# Patient Record
Sex: Male | Born: 1955 | Race: White | Hispanic: No | Marital: Married | Smoking: Former smoker
Health system: Southern US, Community
[De-identification: ages and names within clinical notes are randomized; demographics above are authoritative.]

## PROBLEM LIST (undated history)

## (undated) DIAGNOSIS — E785 Hyperlipidemia, unspecified: Secondary | ICD-10-CM

## (undated) DIAGNOSIS — I10 Essential (primary) hypertension: Secondary | ICD-10-CM

## (undated) DIAGNOSIS — M1990 Unspecified osteoarthritis, unspecified site: Secondary | ICD-10-CM

## (undated) DIAGNOSIS — K219 Gastro-esophageal reflux disease without esophagitis: Secondary | ICD-10-CM

## (undated) HISTORY — PX: TONSILLECTOMY: SUR1361

## (undated) HISTORY — PX: APPENDECTOMY: SHX54

## (undated) HISTORY — PX: OTHER SURGICAL HISTORY: SHX169

---

## 2001-11-26 ENCOUNTER — Emergency Department (HOSPITAL_COMMUNITY): Admission: EM | Admit: 2001-11-26 | Discharge: 2001-11-26 | Payer: Self-pay | Admitting: Emergency Medicine

## 2001-11-27 ENCOUNTER — Ambulatory Visit (HOSPITAL_BASED_OUTPATIENT_CLINIC_OR_DEPARTMENT_OTHER): Admission: RE | Admit: 2001-11-27 | Discharge: 2001-11-27 | Payer: Self-pay | Admitting: Orthopedic Surgery

## 2003-07-09 ENCOUNTER — Emergency Department (HOSPITAL_COMMUNITY): Admission: EM | Admit: 2003-07-09 | Discharge: 2003-07-09 | Payer: Self-pay

## 2004-06-30 ENCOUNTER — Emergency Department (HOSPITAL_COMMUNITY): Admission: EM | Admit: 2004-06-30 | Discharge: 2004-06-30 | Payer: Self-pay | Admitting: Emergency Medicine

## 2007-06-14 ENCOUNTER — Emergency Department (HOSPITAL_COMMUNITY): Admission: EM | Admit: 2007-06-14 | Discharge: 2007-06-14 | Payer: Self-pay | Admitting: Emergency Medicine

## 2010-05-28 ENCOUNTER — Emergency Department (HOSPITAL_COMMUNITY)
Admission: EM | Admit: 2010-05-28 | Discharge: 2010-05-28 | Payer: Self-pay | Source: Home / Self Care | Admitting: Emergency Medicine

## 2010-06-01 ENCOUNTER — Inpatient Hospital Stay (HOSPITAL_COMMUNITY)
Admission: EM | Admit: 2010-06-01 | Discharge: 2010-06-03 | Payer: Self-pay | Source: Home / Self Care | Attending: Internal Medicine | Admitting: Internal Medicine

## 2010-06-03 ENCOUNTER — Ambulatory Visit (HOSPITAL_COMMUNITY)
Admission: RE | Admit: 2010-06-03 | Discharge: 2010-06-03 | Payer: Self-pay | Source: Home / Self Care | Admitting: Cardiology

## 2010-09-07 LAB — DIFFERENTIAL
Basophils Relative: 0 % (ref 0–1)
Eosinophils Absolute: 0.1 10*3/uL (ref 0.0–0.7)
Lymphocytes Relative: 24 % (ref 12–46)
Lymphs Abs: 1.8 10*3/uL (ref 0.7–4.0)
Monocytes Absolute: 0.4 10*3/uL (ref 0.1–1.0)
Neutrophils Relative %: 69 % (ref 43–77)

## 2010-09-07 LAB — CBC
HCT: 44.7 % (ref 39.0–52.0)
Hemoglobin: 15.4 g/dL (ref 13.0–17.0)
MCH: 30.6 pg (ref 26.0–34.0)
MCH: 31.6 pg (ref 26.0–34.0)
MCV: 87.8 fL (ref 78.0–100.0)
MCV: 88.1 fL (ref 78.0–100.0)
Platelets: 199 10*3/uL (ref 150–400)
RBC: 5.03 MIL/uL (ref 4.22–5.81)
RDW: 12.6 % (ref 11.5–15.5)
WBC: 7.5 10*3/uL (ref 4.0–10.5)
WBC: 8.2 10*3/uL (ref 4.0–10.5)

## 2010-09-07 LAB — BASIC METABOLIC PANEL
BUN: 10 mg/dL (ref 6–23)
CO2: 28 mEq/L (ref 19–32)
Calcium: 9.2 mg/dL (ref 8.4–10.5)
Creatinine, Ser: 0.94 mg/dL (ref 0.4–1.5)
GFR calc Af Amer: >60 mL/min (ref >60)
Glucose, Bld: 107 mg/dL — ABNORMAL HIGH (ref 70–99)
Potassium: 4.4 mEq/L (ref 3.5–5.1)
Sodium: 138 mEq/L (ref 135–145)

## 2010-09-07 LAB — LIPID PANEL
Cholesterol: 149 mg/dL (ref 0–200)
Total CHOL/HDL Ratio: 3.4 RATIO
VLDL: 49 mg/dL — ABNORMAL HIGH (ref 0–40)

## 2010-09-07 LAB — CK TOTAL AND CKMB (NOT AT ARMC)
Relative Index: INVALID (ref 0.0–2.5)
Total CK: 104 U/L (ref 7–232)

## 2010-09-07 LAB — COMPREHENSIVE METABOLIC PANEL
Alkaline Phosphatase: 47 U/L (ref 39–117)
CO2: 23 mEq/L (ref 19–32)
GFR calc Af Amer: >60 mL/min (ref >60)
GFR calc non Af Amer: >60 mL/min (ref >60)
Glucose, Bld: 103 mg/dL — ABNORMAL HIGH (ref 70–99)
Potassium: 4.4 mEq/L (ref 3.5–5.1)
Sodium: 139 mEq/L (ref 135–145)
Total Protein: 7 g/dL (ref 6.0–8.3)

## 2010-09-07 LAB — PROTIME-INR
INR: 0.96 (ref 0.00–1.49)
Prothrombin Time: 13 seconds (ref 11.6–15.2)

## 2010-09-07 LAB — TROPONIN I: Troponin I: 0.02 ng/mL (ref 0.00–0.06)

## 2010-09-07 LAB — CARDIAC PANEL(CRET KIN+CKTOT+MB+TROPI)
Relative Index: INVALID (ref 0.0–2.5)
Total CK: 54 U/L (ref 7–232)

## 2010-11-13 NOTE — Op Note (Signed)
[facility]  Patient:    Lawrence, Henderson Visit Number: [id number] MRN: [id number]          Service Type: DSU Location: [facility] Attending Physician:  Twana First Dictated by:   Elana Alm Thurston Hole, M.D. Proc. Date: 11/27/01 Admit Date:  11/27/2001                             Operative Report  PREOPERATIVE DIAGNOSIS:  Right grade 1 open humeral shaft fracture.  POSTOPERATIVE DIAGNOSIS:  Right grade 1 open humeral shaft fracture.  PROCEDURES 1. Open reduction, internal fixation of right humeral shaft fracture    using Ace-Dupuy 7.0 mm x 26.0 cm humeral rod with proximal interlocking. 2. Irrigation and debridement of right grade 1 open humeral shaft fracture.  SURGEON:  Elana Alm. Thurston Hole, M.D.  ASSISTANT:  Julien Girt, P.A.  ANESTHESIA:  General.  OPERATIVE TIME:  1-1/2 hours.  COMPLICATIONS:  None.  INDICATIONS:  Lawrence Henderson is a 55 year old gentleman who sustained a humeral shaft fracture on November 26, 2001, when he was kicked by a horse in the midshaft of the humerus.  He sustained a small 1.0 cm open laceration at that time.  He underwent an irrigation and then debridement in the emergency room yesterday, and IV Vancomycin, and returns now to the operating room for a formal fixation of this and further irrigation and debridement.  DESCRIPTION OF PROCEDURE:  Lawrence Henderson was brought to the operating room on November 27, 2001, after a supraclavicular block had been placed in the holding room.  He was placed on the operating room table in the supine position. After an adequate level of general anesthesia was obtained, his right shoulder and arm was prepped using sterile Betadine, and draped using a sterile technique.  Initially through his previously traumatic incision, this was extended 1.0 cm proximally and distally, and a thorough irrigation with both 1000 cc of saline and 500 cc of antibiotic solution was thoroughly  irrigated through the wound, down to the fracture site which could be easily palpated through this traumatic area.  After this was done, then through a 2.0 cm rotator cuff lateral acromial incision, initial exposure to the proximal humerus was made.  The underlying subcutaneous tissues were incised in line with the skin incision.  A guide pin was placed at the tip of the greater tuberosity under fluoroscopic control, and then drilled down the proximal humerus under fluoroscopic control, and then overdrilled with the step drill from the Ace-Dupuy set.  After this was done, then a guide wire was placed down the humeral shaft to cross the fracture site, and into the distal humeral shaft.  It was then overdrilled with an 8.0 mm drill.  It was measured for length, and a 16.0 mm was found to be the appropriate length.  Then a 7.0 mm x 26.0 mm humeral rod was placed with excellent fixation.  A proximal interlocking screw was placed using the proximal guide and a 38.0 mm interlocking screw was placed to secure the proximal aspect of the humeral rod.  It was felt that it could possibly be further stabilized by putting in a distal humeral rod; however, I attempted to do this with four different attempts to gain good access to the distal interlocking screw; however, this was not successful, and I did feel after this that there was just minimal rotational instability of the fracture and there was still excellent stability  without the distal interlocking screw.  Thus I aborted this effort.  This had been done through a separate 2.0 cm transverse incision.  At this point then all wounds were further irrigated.  The fracture was found to be stable and the traumatic wound was found to be satisfactorily irrigated and debrided. The rotator cuff incision was then closed with #0 Panacryl sutures.  The subcutaneous tissues and all the other incisions were closed with #0 and #2-0 Vicryl.  The skin was closed  with skin staples.  Sterile dressings were applied, then a brace and a sling.  The patient was awakened and taken to the recovery room in stable condition. The needle and sponge counts were correct x 2 at the end of the case.  FOLLOW-UP CARE:  Lawrence Henderson will be followed as an outpatient on Percocet for pain and Cipro 500 mg p.o. b.i.d.  I will see him back in the office in one week for a wound check and followup. Dictated by:   Elana Alm Thurston Hole, M.D. Attending Physician:  Twana First DD:  11/27/01 TD:  11/28/01 Job: [id number]

## 2011-04-02 LAB — COMPREHENSIVE METABOLIC PANEL
AST: 22
BUN: 8
CO2: 26
Creatinine, Ser: 0.79
GFR calc Af Amer: >60
Potassium: 3.8
Total Protein: 6.6

## 2011-04-02 LAB — CBC
MCHC: 35.5
MCV: 87.5
RBC: 4.79

## 2011-04-02 LAB — DIFFERENTIAL
Basophils Absolute: 0
Basophils Relative: 1
Eosinophils Relative: 2
Lymphocytes Relative: 28
Monocytes Absolute: 0.4
Monocytes Relative: 5
Neutro Abs: 5

## 2011-04-02 LAB — URINALYSIS, ROUTINE W REFLEX MICROSCOPIC
Nitrite: NEGATIVE
Urobilinogen, UA: 0.2
pH: 6.5

## 2011-04-02 LAB — LIPASE, BLOOD: Lipase: 26

## 2015-03-03 ENCOUNTER — Emergency Department (HOSPITAL_COMMUNITY): Admission: EM | Admit: 2015-03-03 | Discharge: 2015-03-03 | Discharge status: Absent without leave | Payer: Self-pay

## 2015-03-03 ENCOUNTER — Encounter (HOSPITAL_COMMUNITY): Payer: Self-pay | Admitting: *Deleted

## 2015-03-03 ENCOUNTER — Emergency Department (EMERGENCY_DEPARTMENT_HOSPITAL): Payer: 59

## 2015-03-03 ENCOUNTER — Emergency Department (HOSPITAL_COMMUNITY)
Admission: EM | Admit: 2015-03-03 | Discharge: 2015-03-03 | Disposition: A | Discharge status: Home / Self Care | Payer: 59 | Attending: Emergency Medicine | Admitting: Emergency Medicine

## 2015-03-03 DIAGNOSIS — Y9389 Activity, other specified: Secondary | ICD-10-CM | POA: Insufficient documentation

## 2015-03-03 DIAGNOSIS — W1839XA Other fall on same level, initial encounter: Secondary | ICD-10-CM | POA: Insufficient documentation

## 2015-03-03 DIAGNOSIS — Y998 Other external cause status: Secondary | ICD-10-CM | POA: Insufficient documentation

## 2015-03-03 DIAGNOSIS — Z7982 Long term (current) use of aspirin: Secondary | ICD-10-CM | POA: Insufficient documentation

## 2015-03-03 DIAGNOSIS — I10 Essential (primary) hypertension: Secondary | ICD-10-CM | POA: Insufficient documentation

## 2015-03-03 DIAGNOSIS — E785 Hyperlipidemia, unspecified: Secondary | ICD-10-CM | POA: Diagnosis not present

## 2015-03-03 DIAGNOSIS — Y9289 Other specified places as the place of occurrence of the external cause: Secondary | ICD-10-CM | POA: Insufficient documentation

## 2015-03-03 DIAGNOSIS — S80812A Abrasion, left lower leg, initial encounter: Secondary | ICD-10-CM | POA: Insufficient documentation

## 2015-03-03 DIAGNOSIS — M7989 Other specified soft tissue disorders: Secondary | ICD-10-CM | POA: Diagnosis not present

## 2015-03-03 DIAGNOSIS — L0103 Bullous impetigo: Secondary | ICD-10-CM | POA: Insufficient documentation

## 2015-03-03 DIAGNOSIS — Z79899 Other long term (current) drug therapy: Secondary | ICD-10-CM | POA: Diagnosis not present

## 2015-03-03 DIAGNOSIS — L03116 Cellulitis of left lower limb: Secondary | ICD-10-CM | POA: Diagnosis not present

## 2015-03-03 DIAGNOSIS — Z880 Allergy status to penicillin: Secondary | ICD-10-CM | POA: Diagnosis not present

## 2015-03-03 HISTORY — DX: Essential (primary) hypertension: I10

## 2015-03-03 HISTORY — DX: Hyperlipidemia, unspecified: E78.5

## 2015-03-03 MED ORDER — MUPIROCIN CALCIUM 2 % EX CREA: 1.0000 "application " | TOPICAL_CREAM | Freq: Two times a day (BID) | CUTANEOUS | Status: DC

## 2015-03-03 MED ORDER — DOXYCYCLINE HYCLATE 100 MG PO CAPS: 100.0000 mg | ORAL_CAPSULE | Freq: Two times a day (BID) | ORAL | Status: DC

## 2015-03-03 NOTE — Progress Notes (Signed)
VASCULAR LAB PRELIMINARY  PRELIMINARY  PRELIMINARY  PRELIMINARY  Left lower extremity venous duplex completed.    Preliminary report:  Left:  No evidence of DVT, superficial thrombosis, or Baker's cyst.  Jaquarious Grey, RVT 03/03/2015, 6:29 PM

## 2015-03-03 NOTE — ED Notes (Signed)
Bed: [facility] Expected date:  Expected time:  Means of arrival:  Comments: Triage 1

## 2015-03-03 NOTE — ED Provider Notes (Signed)
CSN: [id number]     Arrival date & time 03/03/15  1615 History   First MD Initiated Contact with Patient 03/03/15 1641     Chief Complaint  Patient presents with  . Leg Swelling  . Leg Blisters      (Consider location/radiation/quality/duration/timing/severity/associated sxs/prior Treatment) Patient is a 59 y.o. male presenting with rash. The history is provided by the patient.  Rash Location:  Leg Leg rash location:  L lower leg Quality: blistering, redness and swelling   Severity:  Moderate Onset quality:  Gradual Duration:  2 days Timing:  Constant Progression:  Worsening Chronicity:  New Context comment:  Recent abrasion to shin Relieved by:  Nothing Worsened by:  Nothing tried Ineffective treatments:  None tried Associated symptoms: no fever and no joint pain     Past Medical History  Diagnosis Date  . Hypertension   . Hyperlipidemia    Past Surgical History  Procedure Laterality Date  . Tonsillectomy    . Appendectomy    . Arm surgery      right arm 2006   History reviewed. No pertinent family history. Social History  Substance Use Topics  . Smoking status: Never Smoker   . Smokeless tobacco: None  . Alcohol Use: Yes    Review of Systems  Constitutional: Negative for fever.  Musculoskeletal: Negative for arthralgias.  Skin: Positive for rash.  All other systems reviewed and are negative.     Allergies  Penicillins and Sulfa antibiotics  Home Medications   Prior to Admission medications   Medication Sig Start Date End Date Taking? Authorizing Provider  aspirin EC 81 MG tablet Take 81 mg by mouth daily.   Yes Historical Provider, MD  lisinopril (PRINIVIL,ZESTRIL) 20 MG tablet Take 20 mg by mouth daily. 01/13/15  Yes Historical Provider, MD  Multiple Vitamin (MULTIVITAMIN WITH MINERALS) TABS tablet Take 1 tablet by mouth daily.   Yes Historical Provider, MD  naproxen sodium (ANAPROX) 220 MG tablet Take 440 mg by mouth daily as needed (pain).   Yes  Historical Provider, MD  OVER THE COUNTER MEDICATION Take 1 capsule by mouth daily. otc irritable bowel med   Yes Historical Provider, MD  simvastatin (ZOCOR) 40 MG tablet Take 40 mg by mouth daily. 01/06/15  Yes Historical Provider, MD  vitamin C (ASCORBIC ACID) 500 MG tablet Take 500 mg by mouth daily.   Yes Historical Provider, MD  doxycycline (VIBRAMYCIN) 100 MG capsule Take 1 capsule (100 mg total) by mouth 2 (two) times daily. 03/03/15   Leo Grosser, MD  mupirocin cream (BACTROBAN) 2 % Apply 1 application topically 2 (two) times daily. 03/03/15   Leo Grosser, MD   BP 140/92 mmHg  Pulse 87  Temp(Src) 98.7 F (37.1 C) (Oral)  Resp 16  SpO2 97% Physical Exam  Constitutional: He is oriented to person, place, and time. He appears well-developed and well-nourished. No distress.  HENT:  Head: Normocephalic and atraumatic.  Eyes: Conjunctivae are normal.  Neck: Neck supple. No tracheal deviation present.  Cardiovascular: Normal rate and regular rhythm.   Pulmonary/Chest: Effort normal. No respiratory distress.  Abdominal: Soft. He exhibits no distension.  Neurological: He is alert and oriented to person, place, and time.  Skin: Skin is warm and dry.  Abrasion of left shin with yellow crusting drainage, multiple fluid filled bullae overlying calf and extending erythema  Psychiatric: He has a normal mood and affect.    ED Course  Procedures (including critical care time) Labs Review Labs Reviewed -  No data to display  Imaging Review No results found. I have personally reviewed and evaluated these images and lab results as part of my medical decision-making.   EKG Interpretation None      MDM   Final diagnoses:  Left leg swelling  Bullous impetigo  Cellulitis of left lower extremity    59 y.o. male presents after sustaining n abrasion to his left shin and after riding horses noticing increased swelling, redness, and bullae over his calf. Negative for DVT. Has yellow crusting  at initial injury site, presentation c/w bullous impetigo likely from staph infection. Planned outpatient management with bactroban and doxycycline as Pt has no signs of sepsis or systemic illness at this time. Plan to follow up with PCP as needed and return precautions discussed for worsening or new concerning symptoms.     Leo Grosser, MD 03/04/15 [phone]

## 2015-03-03 NOTE — ED Notes (Addendum)
Pt sent from Urgent care, pt fell and scraped left shin leg on Friday 9/2, abrasion to left shin. On Saturday 9/3 pt rode horses x7 hours. Sunday morning blisters noted to lower medial left leg and back of lower left leg. Pain 8/10 with ambulation. Denies SOB. Sent from urgent care to rule out blood clot. No complains with right leg, pt reports redness of right leg is normal.

## 2015-03-03 NOTE — Discharge Instructions (Signed)

## 2015-07-14 ENCOUNTER — Encounter (HOSPITAL_COMMUNITY): Payer: Self-pay | Admitting: Family Medicine

## 2015-07-14 ENCOUNTER — Emergency Department (HOSPITAL_COMMUNITY): Payer: 59

## 2015-07-14 ENCOUNTER — Emergency Department (HOSPITAL_COMMUNITY)
Admission: EM | Admit: 2015-07-14 | Discharge: 2015-07-14 | Disposition: A | Discharge status: Home / Self Care | Payer: 59 | Attending: Emergency Medicine | Admitting: Emergency Medicine

## 2015-07-14 DIAGNOSIS — R1013 Epigastric pain: Secondary | ICD-10-CM | POA: Insufficient documentation

## 2015-07-14 DIAGNOSIS — E785 Hyperlipidemia, unspecified: Secondary | ICD-10-CM | POA: Insufficient documentation

## 2015-07-14 DIAGNOSIS — Z9049 Acquired absence of other specified parts of digestive tract: Secondary | ICD-10-CM | POA: Insufficient documentation

## 2015-07-14 DIAGNOSIS — I10 Essential (primary) hypertension: Secondary | ICD-10-CM | POA: Diagnosis not present

## 2015-07-14 DIAGNOSIS — Z79899 Other long term (current) drug therapy: Secondary | ICD-10-CM | POA: Diagnosis not present

## 2015-07-14 DIAGNOSIS — Z7982 Long term (current) use of aspirin: Secondary | ICD-10-CM | POA: Diagnosis not present

## 2015-07-14 DIAGNOSIS — R079 Chest pain, unspecified: Secondary | ICD-10-CM | POA: Diagnosis present

## 2015-07-14 LAB — I-STAT TROPONIN, ED
TROPONIN I, POC: 0.01 ng/mL (ref 0.00–0.08)
Troponin i, poc: 0 ng/mL (ref 0.00–0.08)

## 2015-07-14 LAB — BASIC METABOLIC PANEL
Anion gap: 8 (ref 5–15)
BUN: 13 mg/dL (ref 6–20)
CALCIUM: 10.5 mg/dL — AB (ref 8.9–10.3)
CO2: 27 mmol/L (ref 22–32)
CREATININE: 0.91 mg/dL (ref 0.61–1.24)
Chloride: 105 mmol/L (ref 101–111)
Glucose, Bld: 120 mg/dL — ABNORMAL HIGH (ref 65–99)
POTASSIUM: 3.9 mmol/L (ref 3.5–5.1)
SODIUM: 140 mmol/L (ref 135–145)

## 2015-07-14 LAB — CBC
HCT: 44.9 % (ref 39.0–52.0)
HEMOGLOBIN: 15.1 g/dL (ref 13.0–17.0)
MCH: 29.8 pg (ref 26.0–34.0)
MCHC: 33.6 g/dL (ref 30.0–36.0)
MCV: 88.6 fL (ref 78.0–100.0)
PLATELETS: 202 10*3/uL (ref 150–400)
RBC: 5.07 MIL/uL (ref 4.22–5.81)
RDW: 13.2 % (ref 11.5–15.5)
WBC: 13.4 10*3/uL — ABNORMAL HIGH (ref 4.0–10.5)

## 2015-07-14 MED ORDER — PANTOPRAZOLE SODIUM 20 MG PO TBEC: 20.0000 mg | DELAYED_RELEASE_TABLET | Freq: Every day | ORAL | Status: DC

## 2015-07-14 MED ORDER — PANTOPRAZOLE SODIUM 40 MG PO TBEC
40.0000 mg | DELAYED_RELEASE_TABLET | Freq: Once | ORAL | Status: AC
  Administered 2015-07-14: 40 mg via ORAL
  Filled 2015-07-14: qty 1

## 2015-07-14 NOTE — ED Notes (Signed)
Patient undressed, in gown, on monitor, continuous pulse oximetry and blood pressure cuff; visitor at bedside 

## 2015-07-14 NOTE — ED Provider Notes (Signed)
CSN: [id number]     Arrival date & time 07/14/15  0701 History   First MD Initiated Contact with Patient 07/14/15 [phone]     Chief Complaint  Patient presents with  . Chest Pain     (Consider location/radiation/quality/duration/timing/severity/associated sxs/prior Treatment) Patient is a 60 y.o. male presenting with chest pain. The history is provided by the patient and medical records. No language interpreter was used.  Chest Pain Associated symptoms: abdominal pain   Associated symptoms: no back pain, no cough, no dizziness, no headache, no nausea, no shortness of breath, not vomiting and no weakness      ENSAR AUNGST is a 60 y.o. male  with a PMH of HTN, HLD who presents to the Emergency Department complaining of sharp, non-radiating, intermittent epigastric pain that began last night. Patient took tums last night with no relief. No aggravating or alleviating factors noted. Denies chest pain, shortness of breath, fever, n/v.  Father died of MI in his late 54's. No other cardiac family hx. Patient is not a smoker. No hx of heart disease.   Past Medical History  Diagnosis Date  . Hypertension   . Hyperlipidemia    Past Surgical History  Procedure Laterality Date  . Tonsillectomy    . Appendectomy    . Arm surgery      right arm 2006   History reviewed. No pertinent family history. Social History  Substance Use Topics  . Smoking status: Never Smoker   . Smokeless tobacco: None  . Alcohol Use: Yes    Review of Systems  Constitutional: Negative.   HENT: Negative for congestion and sore throat.   Eyes: Negative for visual disturbance.  Respiratory: Negative for cough and shortness of breath.   Cardiovascular: Positive for chest pain.  Gastrointestinal: Positive for abdominal pain. Negative for nausea, vomiting, diarrhea and constipation.  Musculoskeletal: Negative for myalgias, back pain, arthralgias and neck pain.  Skin: Negative for rash.  Allergic/Immunologic:  Negative for immunocompromised state.  Neurological: Negative for dizziness, weakness and headaches.      Allergies  Sulfa antibiotics  Home Medications   Prior to Admission medications   Medication Sig Start Date End Date Taking? Authorizing Provider  aspirin EC 81 MG tablet Take 81 mg by mouth daily.   Yes Historical Provider, MD  calcium carbonate (OS-CAL) 1250 (500 Ca) MG chewable tablet Chew 1-2 tablets by mouth at bedtime as needed for heartburn.   Yes Historical Provider, MD  lisinopril (PRINIVIL,ZESTRIL) 20 MG tablet Take 20 mg by mouth daily. 01/13/15  Yes Historical Provider, MD  Multiple Vitamin (MULTIVITAMIN WITH MINERALS) TABS tablet Take 1 tablet by mouth daily.   Yes Historical Provider, MD  naproxen sodium (ANAPROX) 220 MG tablet Take 440 mg by mouth daily as needed (pain).   Yes Historical Provider, MD  Probiotic Product (DIGESTIVE ADVANTAGE GUMMIES PO) Take 1 tablet by mouth daily.   Yes Historical Provider, MD  ranitidine (ZANTAC) 150 MG tablet Take 150 mg by mouth daily as needed for heartburn.   Yes Historical Provider, MD  simvastatin (ZOCOR) 40 MG tablet Take 40 mg by mouth daily. 01/06/15  Yes Historical Provider, MD  vitamin C (ASCORBIC ACID) 500 MG tablet Take 1,000 mg by mouth daily.    Yes Historical Provider, MD  doxycycline (VIBRAMYCIN) 100 MG capsule Take 1 capsule (100 mg total) by mouth 2 (two) times daily. Patient not taking: Reported on 07/14/2015 03/03/15   Leo Grosser, MD  mupirocin cream (BACTROBAN) 2 % Apply 1  application topically 2 (two) times daily. Patient not taking: Reported on 07/14/2015 03/03/15   Leo Grosser, MD  pantoprazole (PROTONIX) 20 MG tablet Take 1 tablet (20 mg total) by mouth daily. 07/14/15   Tagen Milby Pilcher Jazzma Neidhardt, PA-C   BP 135/87 mmHg  Pulse 83  Temp(Src) 97.6 F (36.4 C) (Oral)  Resp 26  Ht 5\' 11"  (1.803 m)  Wt 110.224 kg  BMI 33.91 kg/m2  SpO2 98% Physical Exam  Constitutional: He is oriented to person, place, and time. He  appears well-developed and well-nourished.  Alert and in no acute distress  HENT:  Head: Normocephalic and atraumatic.  Neck:  No carotid bruits  Cardiovascular: Normal rate, regular rhythm, normal heart sounds and intact distal pulses.  Exam reveals no gallop and no friction rub.   No murmur heard. Pulmonary/Chest: Effort normal and breath sounds normal. No respiratory distress. He has no wheezes. He has no rales. He exhibits no tenderness.  Abdominal: He exhibits no mass. There is no rebound and no guarding.  Abdomen soft, non-distended TTP of epigastrium. Bowel sounds positive in all four quadrants  Musculoskeletal: He exhibits no edema.  Neurological: He is alert and oriented to person, place, and time.  Skin: Skin is warm and dry. No rash noted.  Psychiatric: He has a normal mood and affect. His behavior is normal. Judgment and thought content normal.  Nursing note and vitals reviewed.   ED Course  Procedures (including critical care time) Labs Review Labs Reviewed  BASIC METABOLIC PANEL - Abnormal; Notable for the following:    Glucose, Bld 120 (*)    Calcium 10.5 (*)    All other components within normal limits  CBC - Abnormal; Notable for the following:    WBC 13.4 (*)    All other components within normal limits  I-STAT TROPOININ, ED  Randolm Idol, ED    Imaging Review Dg Chest 2 View  07/14/2015  CLINICAL DATA:  Chest and epigastric pain starting last night. EXAM: CHEST  2 VIEW COMPARISON:  06/01/2010 FINDINGS: Normal heart size and mediastinal contours. No acute infiltrate or edema. No effusion or pneumothorax. No acute osseous findings. IMPRESSION: No active cardiopulmonary disease. Electronically Signed   By: Monte Fantasia M.D.   On: 07/14/2015 07:48   I have personally reviewed and evaluated these images and lab results as part of my medical decision-making.   EKG Interpretation   Date/Time:  Monday July 14 2015 07:02:12 EST Ventricular Rate:   84 PR Interval:  180 QRS Duration: 90 QT Interval:  380 QTC Calculation: 449 R Axis:   14 Text Interpretation:  Normal sinus rhythm Nonspecific T wave abnormality  Abnormal ECG No significant change since last tracing Confirmed by [facility]  MD, Corene Cornea [phone]) on 07/14/2015 12:37:37 PM      MDM   Final diagnoses:  Epigastric pain   Trudee Grip presents with epigastric pain since last night which he attributes to indigestion. Tums taken with little relief.  Heart score of 3. EKG reviewed with no significant changes from previous, no ST elevation.  CXR with no acute cardiopulm disease. Trop x 2 wdl. CBC & BMP reviewed. Patient is low risk for cardiac event. Vitals: Blood pressure 135/87, pulse 83, temperature 97.6 F (36.4 C), temperature source Oral, resp. rate 26, height 5\' 11"  (1.803 m), weight 110.224 kg, SpO2 98 %.  Protonix given, patient re-evaluated and states he feels improved.   A&P: Epigastric pain  - Protonix, PCP follow up  - Return precautions/diet  and home care instructions given  - Patient interested in seeing cardiologist, will give referral to follow up as needed.   [facility] Chrystopher Stangl, PA-C 07/14/15 1248  Merrily Pew, MD 07/14/15 [phone]

## 2015-07-14 NOTE — ED Notes (Signed)
Pt here for epigastric pain that started last night. sts felt like indigestion. Denies radiation.

## 2015-07-14 NOTE — Discharge Instructions (Signed)
1. Medications: Protonix, continue usual home medications 2. Treatment: rest, drink plenty of fluids 3. Follow Up: Please follow up with your primary doctor in 2-3 days for discussion of your diagnoses and further evaluation after today's visit; Please return to the ER for any new or worsening symptoms, any additional concerns.   I have given you a cardiology referral if you would like to follow up with the cardiologist as well.

## 2015-07-15 ENCOUNTER — Other Ambulatory Visit: Payer: Self-pay | Admitting: Family Medicine

## 2015-07-15 ENCOUNTER — Other Ambulatory Visit: Payer: 59

## 2015-07-15 DIAGNOSIS — R1011 Right upper quadrant pain: Secondary | ICD-10-CM

## 2015-07-16 ENCOUNTER — Ambulatory Visit
Admission: RE | Admit: 2015-07-16 | Discharge: 2015-07-16 | Disposition: A | Discharge status: Home / Self Care | Payer: 59 | Source: Ambulatory Visit | Attending: Family Medicine | Admitting: Family Medicine

## 2015-07-16 DIAGNOSIS — R1011 Right upper quadrant pain: Secondary | ICD-10-CM

## 2016-09-01 ENCOUNTER — Other Ambulatory Visit: Payer: Self-pay | Admitting: Family Medicine

## 2016-09-01 DIAGNOSIS — R1031 Right lower quadrant pain: Secondary | ICD-10-CM

## 2016-09-03 ENCOUNTER — Ambulatory Visit
Admission: RE | Admit: 2016-09-03 | Discharge: 2016-09-03 | Disposition: A | Discharge status: Home / Self Care | Payer: 59 | Source: Ambulatory Visit | Attending: Family Medicine | Admitting: Family Medicine

## 2016-09-03 DIAGNOSIS — R1031 Right lower quadrant pain: Secondary | ICD-10-CM

## 2016-09-03 MED ORDER — IOPAMIDOL (ISOVUE-300) INJECTION 61%
125.0000 mL | Freq: Once | INTRAVENOUS | Status: AC | PRN
  Administered 2016-09-03: 125 mL via INTRAVENOUS

## 2016-12-15 ENCOUNTER — Emergency Department (HOSPITAL_COMMUNITY)
Admission: EM | Admit: 2016-12-15 | Discharge: 2016-12-15 | Disposition: A | Discharge status: Home / Self Care | Payer: 59 | Attending: Emergency Medicine | Admitting: Emergency Medicine

## 2016-12-15 ENCOUNTER — Emergency Department (HOSPITAL_COMMUNITY): Payer: 59

## 2016-12-15 ENCOUNTER — Encounter (HOSPITAL_COMMUNITY): Payer: Self-pay

## 2016-12-15 DIAGNOSIS — R509 Fever, unspecified: Secondary | ICD-10-CM | POA: Insufficient documentation

## 2016-12-15 DIAGNOSIS — R6889 Other general symptoms and signs: Secondary | ICD-10-CM

## 2016-12-15 DIAGNOSIS — R52 Pain, unspecified: Secondary | ICD-10-CM | POA: Diagnosis present

## 2016-12-15 DIAGNOSIS — Z7982 Long term (current) use of aspirin: Secondary | ICD-10-CM | POA: Diagnosis not present

## 2016-12-15 DIAGNOSIS — R05 Cough: Secondary | ICD-10-CM | POA: Diagnosis not present

## 2016-12-15 DIAGNOSIS — I10 Essential (primary) hypertension: Secondary | ICD-10-CM | POA: Insufficient documentation

## 2016-12-15 LAB — LIPASE, BLOOD: LIPASE: 32 U/L (ref 11–51)

## 2016-12-15 LAB — URINALYSIS, ROUTINE W REFLEX MICROSCOPIC
Bilirubin Urine: NEGATIVE
Glucose, UA: NEGATIVE mg/dL
Hgb urine dipstick: NEGATIVE
Ketones, ur: NEGATIVE mg/dL
Leukocytes, UA: NEGATIVE
Nitrite: NEGATIVE
PROTEIN: 30 mg/dL — AB
SPECIFIC GRAVITY, URINE: 1.027 (ref 1.005–1.030)
Squamous Epithelial / LPF: NONE SEEN
pH: 6 (ref 5.0–8.0)

## 2016-12-15 LAB — COMPREHENSIVE METABOLIC PANEL
ALBUMIN: 3.8 g/dL (ref 3.5–5.0)
ALK PHOS: 64 U/L (ref 38–126)
ALT: 96 U/L — ABNORMAL HIGH (ref 17–63)
ANION GAP: 6 (ref 5–15)
AST: 116 U/L — ABNORMAL HIGH (ref 15–41)
BILIRUBIN TOTAL: 0.9 mg/dL (ref 0.3–1.2)
BUN: 9 mg/dL (ref 6–20)
CALCIUM: 8.9 mg/dL (ref 8.9–10.3)
CO2: 27 mmol/L (ref 22–32)
Chloride: 103 mmol/L (ref 101–111)
Creatinine, Ser: 0.98 mg/dL (ref 0.61–1.24)
GFR calc Af Amer: >60 mL/min (ref >60)
GFR calc non Af Amer: >60 mL/min (ref >60)
GLUCOSE: 124 mg/dL — AB (ref 65–99)
POTASSIUM: 4.3 mmol/L (ref 3.5–5.1)
SODIUM: 136 mmol/L (ref 135–145)
Total Protein: 6.7 g/dL (ref 6.5–8.1)

## 2016-12-15 LAB — DIFFERENTIAL
BASOS ABS: 0 10*3/uL (ref 0.0–0.1)
Basophils Relative: 1 %
Eosinophils Absolute: 0 10*3/uL (ref 0.0–0.7)
Eosinophils Relative: 1 %
LYMPHS PCT: 10 %
Lymphs Abs: 0.4 10*3/uL — ABNORMAL LOW (ref 0.7–4.0)
Monocytes Absolute: 0.2 10*3/uL (ref 0.1–1.0)
Monocytes Relative: 4 %
NEUTROS ABS: 3.7 10*3/uL (ref 1.7–7.7)
NEUTROS PCT: 84 %

## 2016-12-15 LAB — CBC
HEMATOCRIT: 43.6 % (ref 39.0–52.0)
HEMOGLOBIN: 14.9 g/dL (ref 13.0–17.0)
MCH: 30 pg (ref 26.0–34.0)
MCHC: 34.2 g/dL (ref 30.0–36.0)
MCV: 87.7 fL (ref 78.0–100.0)
Platelets: 105 10*3/uL — ABNORMAL LOW (ref 150–400)
RBC: 4.97 MIL/uL (ref 4.22–5.81)
RDW: 12.9 % (ref 11.5–15.5)
WBC: 4.4 10*3/uL (ref 4.0–10.5)

## 2016-12-15 MED ORDER — AMOXICILLIN 500 MG PO CAPS: 500.0000 mg | ORAL_CAPSULE | Freq: Two times a day (BID) | ORAL | 0 refills | Status: DC

## 2016-12-15 MED ORDER — IBUPROFEN 400 MG PO TABS
600.0000 mg | ORAL_TABLET | Freq: Once | ORAL | Status: AC
  Administered 2016-12-15: 600 mg via ORAL
  Filled 2016-12-15: qty 1

## 2016-12-15 MED ORDER — SODIUM CHLORIDE 0.9 % IV BOLUS (SEPSIS)
1000.0000 mL | Freq: Once | INTRAVENOUS | Status: AC
  Administered 2016-12-15: 1000 mL via INTRAVENOUS

## 2016-12-15 NOTE — ED Triage Notes (Signed)
Pt reports generalized body aches and chills. He states he woke up this morning in a cold sweat and soaking wet from sweating. He reports finding a tick on him 3 weeks ago but denies any rash or markings from it. Denies n/v/d or trouble urinating. He does report a dry cough.

## 2016-12-15 NOTE — ED Notes (Signed)
Pt ambulated to restroom from room, tolerated well. 

## 2016-12-15 NOTE — Discharge Instructions (Signed)
Your chest x-ray shows no signs of infection. Your urine shows no signs of infection. Her platelets are slightly low. Your liver enzymes are slightly elevated. Please make sure you're taking Motrin to help with her fever. Drink plenty of fluids. Get plenty of rest. Make sure you call your primary doctor tomorrow to schedule a follow-up appointment this week or next week to have your lab work rechecked. Return to the ED sooner if he develops any worsening symptoms.

## 2016-12-15 NOTE — ED Notes (Signed)
Patient transported to X-ray 

## 2016-12-16 LAB — HEPATITIS PANEL, ACUTE
HEP A IGM: NEGATIVE
HEP B S AG: NEGATIVE
Hep B C IgM: NEGATIVE

## 2016-12-16 NOTE — ED Provider Notes (Signed)
[facility] DEPT Provider Note   CSN: [id number] Arrival date & time: 12/15/16  1005     History   Chief Complaint Chief Complaint  Patient presents with  . Generalized Body Aches  . Chills    HPI Lawrence Henderson is a 61 y.o. male.  HPI 61 yo male pmh sig for hyperlipidemia and hypertension presents to the ED today with  Complaint of generalized body aches, chill, and dry cough. Pt states that he woke up from sleep last night with chills, sweats, a fever. He took some tylenol which helped his fever. His symptoms continued this morning. He also complains of a dry cough for the past 1-2 days. Pt reports finding a tick on him 3 weeks ago. It was on for less than 24 hours and was not engorged. He denies any rash. Pt reports swimming weekly in his pool.  Did have an outer ear infection last week that he did not looked at that resolved on his own. Pt denies any other symptoms. Denies any sick contacts. Pt denies any ha, vision changes, lightheadedness, dizziness, congestion, neck pain, cp, sob, abd pain, n/v/d, urinary symptoms, change in bowel habits, melena, hematochezia, lower extremity paresthesias.  Past Medical History:  Diagnosis Date  . Hyperlipidemia   . Hypertension     There are no active problems to display for this patient.   Past Surgical History:  Procedure Laterality Date  . APPENDECTOMY    . arm surgery     right arm 2006  . TONSILLECTOMY         Home Medications    Prior to Admission medications   Medication Sig Start Date End Date Taking? Authorizing Provider  aspirin EC 81 MG tablet Take 81 mg by mouth daily.   Yes [provider]  calcium carbonate (OS-CAL) 1250 (500 Ca) MG chewable tablet Chew 1-2 tablets by mouth at bedtime as needed for heartburn.   Yes [provider]  lisinopril (PRINIVIL,ZESTRIL) 20 MG tablet Take 20 mg by mouth daily. 01/13/15  Yes [provider]  Multiple Vitamin (MULTIVITAMIN WITH MINERALS) TABS  tablet Take 1 tablet by mouth daily.   Yes [provider]  naproxen sodium (ANAPROX) 220 MG tablet Take 440 mg by mouth daily as needed (pain).   Yes [provider]  Pseudoeph-Doxylamine-DM-APAP (NYQUIL PO) Take 15 mLs by mouth at bedtime as needed (cough and sleep).   Yes [provider]  rosuvastatin (CRESTOR) 40 MG tablet Take 40 mg by mouth daily.   Yes [provider]  amoxicillin (AMOXIL) 500 MG capsule Take 1 capsule (500 mg total) by mouth 2 (two) times daily. 12/15/16   Doristine Devoid, PA-C  doxycycline (VIBRAMYCIN) 100 MG capsule Take 1 capsule (100 mg total) by mouth 2 (two) times daily. Patient not taking: Reported on 07/14/2015 03/03/15   Leo Grosser, MD  mupirocin cream (BACTROBAN) 2 % Apply 1 application topically 2 (two) times daily. Patient not taking: Reported on 07/14/2015 03/03/15   Leo Grosser, MD  pantoprazole (PROTONIX) 20 MG tablet Take 1 tablet (20 mg total) by mouth daily. Patient not taking: Reported on 12/15/2016 07/14/15   Ward, Ozella Almond, PA-C    Family History No family history on file.  Social History Social History  Substance Use Topics  . Smoking status: Never Smoker  . Smokeless tobacco: Never Used  . Alcohol use Yes     Allergies   Sulfa antibiotics   Review of Systems Review of Systems  Constitutional:  Positive for chills and fever.  HENT: Positive for ear pain. Negative for congestion and sore throat.   Eyes: Negative for visual disturbance.  Respiratory: Negative for cough and shortness of breath.   Cardiovascular: Negative for chest pain.  Gastrointestinal: Negative for abdominal pain, diarrhea, nausea and vomiting.  Genitourinary: Negative for dysuria, flank pain, frequency, hematuria, scrotal swelling, testicular pain and urgency.  Musculoskeletal: Positive for myalgias. Negative for arthralgias, neck pain and neck stiffness.  Skin: Negative for rash.  Neurological: Negative for dizziness,  syncope, weakness, light-headedness, numbness and headaches.  Psychiatric/Behavioral: Negative for sleep disturbance. The patient is not nervous/anxious.      Physical Exam Updated Vital Signs BP 119/70   Pulse 92   Temp 100.1 F (37.8 C) (Oral)   Resp 18   Ht 5\' 11"  (1.803 m)   Wt 107.5 kg (237 lb)   SpO2 96%   BMI 33.05 kg/m   Physical Exam  Constitutional: He is oriented to person, place, and time. He appears well-developed and well-nourished.  Non-toxic appearance. No distress.  HENT:  Head: Normocephalic and atraumatic.  Right Ear: External ear normal. No drainage. No mastoid tenderness. Tympanic membrane is injected, erythematous and bulging.  Left Ear: Hearing, tympanic membrane and ear canal normal. No mastoid tenderness.  Mouth/Throat: Oropharynx is clear and moist.  Eyes: Conjunctivae and EOM are normal. Pupils are equal, round, and reactive to light. Right eye exhibits no discharge. Left eye exhibits no discharge.  Neck: Normal range of motion. Neck supple.  No c spine midline tenderness. No paraspinal tenderness. No deformities or step offs noted. Full ROM. Supple. No nuchal rigidity.    Cardiovascular: Normal rate, regular rhythm, normal heart sounds and intact distal pulses.  Exam reveals no gallop and no friction rub.   No murmur heard. Pulmonary/Chest: Effort normal and breath sounds normal. No respiratory distress. He has no wheezes. He exhibits no tenderness.  Dry cough noted.  Abdominal: Soft. Bowel sounds are normal. He exhibits no distension. There is no tenderness. There is no rebound and no guarding.  Musculoskeletal: Normal range of motion. He exhibits no tenderness.  Lymphadenopathy:    He has no cervical adenopathy.  Neurological: He is alert and oriented to person, place, and time.  Skin: Skin is warm and dry. Capillary refill takes less than 2 seconds. No rash noted.  No rash or tick bite  Psychiatric: His behavior is normal. Judgment and thought  content normal.  Nursing note and vitals reviewed.    ED Treatments / Results  Labs (all labs ordered are listed, but only abnormal results are displayed) Labs Reviewed  COMPREHENSIVE METABOLIC PANEL - Abnormal; Notable for the following:       Result Value   Glucose, Bld 124 (*)    AST 116 (*)    ALT 96 (*)    All other components within normal limits  CBC - Abnormal; Notable for the following:    Platelets 105 (*)    All other components within normal limits  URINALYSIS, ROUTINE W REFLEX MICROSCOPIC - Abnormal; Notable for the following:    Color, Urine AMBER (*)    APPearance HAZY (*)    Protein, ur 30 (*)    Bacteria, UA RARE (*)    All other components within normal limits  DIFFERENTIAL - Abnormal; Notable for the following:    Lymphs Abs 0.4 (*)    All other components within normal limits  LIPASE, BLOOD  HEPATITIS PANEL, ACUTE    EKG  EKG Interpretation None       Radiology Dg Chest 2 View  Result Date: 12/15/2016 CLINICAL DATA:  Cough and fever EXAM: CHEST  2 VIEW COMPARISON:  07/14/2015 FINDINGS: The heart size and mediastinal contours are within normal limits. Both lungs are clear. The visualized skeletal structures are unremarkable. IMPRESSION: No active cardiopulmonary disease. Electronically Signed   By: Franchot Gallo M.D.   On: 12/15/2016 13:07    Procedures Procedures (including critical care time)  Medications Ordered in ED Medications  ibuprofen (ADVIL,MOTRIN) tablet 600 mg (600 mg Oral Given 12/15/16 1403)  sodium chloride 0.9 % bolus 1,000 mL (0 mLs Intravenous Stopped 12/15/16 1539)     Initial Impression / Assessment and Plan / ED Course  I have reviewed the triage vital signs and the nursing notes.  Pertinent labs & imaging results that were available during my care of the patient were reviewed by me and considered in my medical decision making (see chart for details).  Clinical Course as of Dec 17 2014  Wed Dec 15, 2016  1534  Differential [KL]    Clinical Course User Index [KL] Doristine Devoid, PA-C    Pt presents with vague complaints of fever, chills, nigh sweats, dry cough. Pt is overall well appearing. He is non toxic. Pt febrile in triage that resolved with motrin and fluids. He is not tachycardic or hypotensive.  No leukocytosis is noted. Creatine normal. Pt will mild elevation in liver function. UA show no signs of infection. Mild thrombocytopenia noted with no history of same. Lipase normal. Pt does have signs of otitis media of right ear. No nuchal rigidity concerning for meningitis. CXr is unremarkable. No rash appreciated or tick bite concerning for tick borne illness. Tick on for less then 24 hrs and not engorged. Do not feel that doxy is appropriate. Unsure of etiology of fever. Pt is overall well appearing. Spoke with is pcp who will follow up on hepatitis panel. She will see him this week to recheck liver function and platelets. Pt is hemodynamically stable, in NAD, & able to ambulate in the ED. Evaluation does not show pathology that would require ongoing emergent intervention or inpatient treatment. I explained the diagnosis to the patient. Pain has been managed & has no complaints prior to dc. Pt is comfortable with above plan and is stable for discharge at this time. All questions were answered prior to disposition. Strict return precautions for f/u to the ED were discussed. Encouraged follow up with PCP. Pt seen by Dr. Alvino Chapel who is agreeable to the above plans.   Final Clinical Impressions(s) / ED Diagnoses   Final diagnoses:  Flu-like symptoms    New Prescriptions Discharge Medication List as of 12/15/2016  3:40 PM    START taking these medications   Details  amoxicillin (AMOXIL) 500 MG capsule Take 1 capsule (500 mg total) by mouth 2 (two) times daily., Starting Wed 12/15/2016, Print         Doristine Devoid, PA-C 12/16/16 2029    Davonna Belling, MD 12/17/16 0700

## 2016-12-17 ENCOUNTER — Other Ambulatory Visit (HOSPITAL_COMMUNITY): Payer: Self-pay | Admitting: Family Medicine

## 2016-12-17 DIAGNOSIS — R748 Abnormal levels of other serum enzymes: Secondary | ICD-10-CM

## 2016-12-20 ENCOUNTER — Ambulatory Visit (HOSPITAL_COMMUNITY)
Admission: RE | Admit: 2016-12-20 | Discharge: 2016-12-20 | Disposition: A | Discharge status: Home / Self Care | Payer: 59 | Source: Ambulatory Visit | Attending: Family Medicine | Admitting: Family Medicine

## 2016-12-20 DIAGNOSIS — R748 Abnormal levels of other serum enzymes: Secondary | ICD-10-CM | POA: Diagnosis not present

## 2016-12-20 DIAGNOSIS — K760 Fatty (change of) liver, not elsewhere classified: Secondary | ICD-10-CM | POA: Insufficient documentation

## 2017-01-28 ENCOUNTER — Observation Stay (HOSPITAL_COMMUNITY)
Admission: EM | Admit: 2017-01-28 | Discharge: 2017-01-29 | Disposition: A | Discharge status: Home / Self Care | Payer: 59 | Attending: Family Medicine | Admitting: Family Medicine

## 2017-01-28 ENCOUNTER — Encounter (HOSPITAL_COMMUNITY): Payer: Self-pay | Admitting: Nurse Practitioner

## 2017-01-28 ENCOUNTER — Emergency Department (HOSPITAL_COMMUNITY): Payer: 59

## 2017-01-28 DIAGNOSIS — Z7982 Long term (current) use of aspirin: Secondary | ICD-10-CM | POA: Diagnosis not present

## 2017-01-28 DIAGNOSIS — R42 Dizziness and giddiness: Secondary | ICD-10-CM | POA: Insufficient documentation

## 2017-01-28 DIAGNOSIS — Z79899 Other long term (current) drug therapy: Secondary | ICD-10-CM | POA: Diagnosis not present

## 2017-01-28 DIAGNOSIS — E669 Obesity, unspecified: Secondary | ICD-10-CM | POA: Diagnosis present

## 2017-01-28 DIAGNOSIS — I10 Essential (primary) hypertension: Secondary | ICD-10-CM | POA: Diagnosis present

## 2017-01-28 DIAGNOSIS — E785 Hyperlipidemia, unspecified: Secondary | ICD-10-CM | POA: Diagnosis not present

## 2017-01-28 DIAGNOSIS — E7849 Other hyperlipidemia: Secondary | ICD-10-CM

## 2017-01-28 DIAGNOSIS — R079 Chest pain, unspecified: Secondary | ICD-10-CM | POA: Diagnosis not present

## 2017-01-28 DIAGNOSIS — R0602 Shortness of breath: Secondary | ICD-10-CM | POA: Diagnosis not present

## 2017-01-28 LAB — LIPID PANEL
CHOLESTEROL: 220 mg/dL — AB (ref 0–200)
HDL: 53 mg/dL (ref >40)
LDL Cholesterol: 100 mg/dL — ABNORMAL HIGH (ref 0–99)
Total CHOL/HDL Ratio: 4.2 RATIO
Triglycerides: 333 mg/dL — ABNORMAL HIGH (ref <150)
VLDL: 67 mg/dL — AB (ref 0–40)

## 2017-01-28 LAB — BASIC METABOLIC PANEL
Anion gap: 10 (ref 5–15)
BUN: 11 mg/dL (ref 6–20)
CALCIUM: 9.6 mg/dL (ref 8.9–10.3)
CHLORIDE: 103 mmol/L (ref 101–111)
CO2: 24 mmol/L (ref 22–32)
CREATININE: 0.79 mg/dL (ref 0.61–1.24)
GFR calc non Af Amer: >60 mL/min (ref >60)
Glucose, Bld: 100 mg/dL — ABNORMAL HIGH (ref 65–99)
Potassium: 4.1 mmol/L (ref 3.5–5.1)
SODIUM: 137 mmol/L (ref 135–145)

## 2017-01-28 LAB — CBC
HCT: 45.8 % (ref 39.0–52.0)
HEMOGLOBIN: 16 g/dL (ref 13.0–17.0)
MCH: 30.2 pg (ref 26.0–34.0)
MCHC: 34.9 g/dL (ref 30.0–36.0)
MCV: 86.4 fL (ref 78.0–100.0)
PLATELETS: 217 10*3/uL (ref 150–400)
RBC: 5.3 MIL/uL (ref 4.22–5.81)
RDW: 13.2 % (ref 11.5–15.5)
WBC: 8.8 10*3/uL (ref 4.0–10.5)

## 2017-01-28 LAB — I-STAT TROPONIN, ED
TROPONIN I, POC: 0 ng/mL (ref 0.00–0.08)
TROPONIN I, POC: 0 ng/mL (ref 0.00–0.08)

## 2017-01-28 LAB — TROPONIN I: Troponin I: <0.03 ng/mL (ref <0.03)

## 2017-01-28 MED ORDER — HYDRALAZINE HCL 20 MG/ML IJ SOLN: 5.0000 mg | INTRAMUSCULAR | Status: DC | PRN

## 2017-01-28 MED ORDER — ACETAMINOPHEN 325 MG PO TABS: 650.0000 mg | ORAL_TABLET | ORAL | Status: DC | PRN

## 2017-01-28 MED ORDER — NAPROXEN SODIUM 275 MG PO TABS
440.0000 mg | ORAL_TABLET | Freq: Every day | ORAL | Status: DC | PRN
  Filled 2017-01-28: qty 2

## 2017-01-28 MED ORDER — ASPIRIN EC 325 MG PO TBEC
325.0000 mg | DELAYED_RELEASE_TABLET | Freq: Every day | ORAL | Status: DC
  Administered 2017-01-29: 325 mg via ORAL
  Filled 2017-01-28 (×2): qty 1

## 2017-01-28 MED ORDER — ENOXAPARIN SODIUM 40 MG/0.4ML ~~LOC~~ SOLN
40.0000 mg | SUBCUTANEOUS | Status: DC
  Administered 2017-01-28: 40 mg via SUBCUTANEOUS
  Filled 2017-01-28: qty 0.4

## 2017-01-28 MED ORDER — LISINOPRIL 20 MG PO TABS
20.0000 mg | ORAL_TABLET | Freq: Every day | ORAL | Status: DC
  Administered 2017-01-29: 20 mg via ORAL
  Filled 2017-01-28: qty 1

## 2017-01-28 MED ORDER — ONDANSETRON HCL 4 MG/2ML IJ SOLN
4.0000 mg | Freq: Four times a day (QID) | INTRAMUSCULAR | Status: DC | PRN
  Filled 2017-01-28: qty 2

## 2017-01-28 MED ORDER — LISINOPRIL 20 MG PO TABS: 20.0000 mg | ORAL_TABLET | Freq: Every day | ORAL | Status: DC

## 2017-01-28 MED ORDER — MORPHINE SULFATE (PF) 4 MG/ML IV SOLN: 2.0000 mg | INTRAVENOUS | Status: DC | PRN

## 2017-01-28 MED ORDER — GI COCKTAIL ~~LOC~~: 30.0000 mL | Freq: Four times a day (QID) | ORAL | Status: DC | PRN

## 2017-01-28 MED ORDER — ROSUVASTATIN CALCIUM 40 MG PO TABS
40.0000 mg | ORAL_TABLET | Freq: Every day | ORAL | Status: DC
  Administered 2017-01-29: 40 mg via ORAL
  Filled 2017-01-28: qty 1
  Filled 2017-01-28 (×2): qty 4
  Filled 2017-01-28: qty 1
  Filled 2017-01-28: qty 4

## 2017-01-28 NOTE — ED Notes (Signed)
Patient denies pain and is resting comfortably. Reports a slight tightness in chest.

## 2017-01-28 NOTE — ED Notes (Signed)
[name] in bed placement advised of patient's ongoing chest pain, stated she would see if she could get patient bed assignment on 3W

## 2017-01-28 NOTE — ED Triage Notes (Signed)
Pt presents with c/o chest tightness. 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 The pain has remained constant since. The pain is a midsternal tightness that does not radiate. He reports dizziness,  shortness of breath. he denies weakness, fevers, cough, nausea, vomiting.

## 2017-01-28 NOTE — ED Notes (Signed)
Patient c/o chest tightness again, will contact bed control to see if patient can get a bed on a more appropriate floor

## 2017-01-28 NOTE — ED Notes (Signed)
Dyanne Carrel, NP at bedside to assess patient.

## 2017-01-28 NOTE — ED Provider Notes (Signed)
[facility] DEPT Provider Note   CSN: [id number] Arrival date & time: 01/28/17  1244     History   Chief Complaint Chief Complaint  Patient presents with  . Chest Pain    HPI Lawrence Henderson is a 61 y.o. male.  The history is provided by the patient.  Chest Pain   This is a new problem. Episode onset: 36 hours ago. Episode frequency: frequent epiodes, every 30 to 40 minutes, last 30 seconds or so,  The problem has been gradually improving. The pain is present in the substernal region. The pain is moderate. Quality: tightness in the chest. The pain does not radiate. Duration of episode(s) is 30 seconds. Associated symptoms include diaphoresis (with the pain), dizziness (maybe a second or so) and shortness of breath (little). Pertinent negatives include no cough, no fever and no vomiting. Treatments tried: alleve helped a little yesterday then it returned.  His past medical history is significant for hyperlipidemia and hypertension.  Pertinent negatives for past medical history include no CAD and no PE.  Pt has had a stress test in the past when he was having shoulder pain.  That was maybe 6 years ago Father heart disease in the 7s died at 11.  No tobacco Past Medical History:  Diagnosis Date  . Hyperlipidemia   . Hypertension     There are no active problems to display for this patient.   Past Surgical History:  Procedure Laterality Date  . APPENDECTOMY    . arm surgery     right arm 2006  . TONSILLECTOMY         Home Medications    Prior to Admission medications   Medication Sig Start Date End Date Taking? Authorizing Provider  amoxicillin (AMOXIL) 500 MG capsule Take 1 capsule (500 mg total) by mouth 2 (two) times daily. 12/15/16   Doristine Devoid, PA-C  aspirin EC 81 MG tablet Take 81 mg by mouth daily.    [provider]  calcium carbonate (OS-CAL) 1250 (500 Ca) MG chewable tablet Chew 1-2 tablets by mouth at bedtime as needed for heartburn.     [provider]  doxycycline (VIBRAMYCIN) 100 MG capsule Take 1 capsule (100 mg total) by mouth 2 (two) times daily. Patient not taking: Reported on 07/14/2015 03/03/15   Leo Grosser, MD  lisinopril (PRINIVIL,ZESTRIL) 20 MG tablet Take 20 mg by mouth daily. 01/13/15   [provider]  Multiple Vitamin (MULTIVITAMIN WITH MINERALS) TABS tablet Take 1 tablet by mouth daily.    [provider]  mupirocin cream (BACTROBAN) 2 % Apply 1 application topically 2 (two) times daily. Patient not taking: Reported on 07/14/2015 03/03/15   Leo Grosser, MD  naproxen sodium (ANAPROX) 220 MG tablet Take 440 mg by mouth daily as needed (pain).    [provider]  pantoprazole (PROTONIX) 20 MG tablet Take 1 tablet (20 mg total) by mouth daily. Patient not taking: Reported on 12/15/2016 07/14/15   Ward, Ozella Almond, PA-C  Pseudoeph-Doxylamine-DM-APAP (NYQUIL PO) Take 15 mLs by mouth at bedtime as needed (cough and sleep).    [provider]  rosuvastatin (CRESTOR) 40 MG tablet Take 40 mg by mouth daily.    [provider]    Family History History reviewed. No pertinent family history.  Social History Social History  Substance Use Topics  . Smoking status: Never Smoker  . Smokeless tobacco: Never Used  . Alcohol use Yes     Allergies   Sulfa antibiotics  Review of Systems Review of Systems  Constitutional: Positive for diaphoresis (with the pain). Negative for fever.  Respiratory: Positive for shortness of breath (little). Negative for cough.   Cardiovascular: Positive for chest pain.  Gastrointestinal: Negative for vomiting.  Neurological: Positive for dizziness (maybe a second or so).  All other systems reviewed and are negative.    Physical Exam Updated Vital Signs BP 121/86   Pulse 82   Temp 97.7 F (36.5 C) (Oral)   Resp (!) 23   SpO2 94%   Physical Exam  Constitutional: He appears well-developed and well-nourished. No distress.    HENT:  Head: Normocephalic and atraumatic.  Right Ear: External ear normal.  Left Ear: External ear normal.  Eyes: Conjunctivae are normal. Right eye exhibits no discharge. Left eye exhibits no discharge. No scleral icterus.  Neck: Neck supple. No tracheal deviation present.  Cardiovascular: Normal rate, regular rhythm and intact distal pulses.   Pulmonary/Chest: Effort normal and breath sounds normal. No stridor. No respiratory distress. He has no wheezes. He has no rales.  Abdominal: Soft. Bowel sounds are normal. He exhibits no distension. There is no tenderness. There is no rebound and no guarding.  Musculoskeletal: He exhibits no edema or tenderness.  Neurological: He is alert. He has normal strength. No cranial nerve deficit (no facial droop, extraocular movements intact, no slurred speech) or sensory deficit. He exhibits normal muscle tone. He displays no seizure activity. Coordination normal.  Skin: Skin is warm and dry. No rash noted.  Psychiatric: He has a normal mood and affect.  Nursing note and vitals reviewed.    ED Treatments / Results  Labs (all labs ordered are listed, but only abnormal results are displayed) Labs Reviewed  BASIC METABOLIC PANEL - Abnormal; Notable for the following:       Result Value   Glucose, Bld 100 (*)    All other components within normal limits  CBC  I-STAT TROPONIN, ED  I-STAT TROPONIN, ED    EKG  EKG Interpretation  Date/Time:  Friday January 28 2017 12:48:01 EDT Ventricular Rate:  90 PR Interval:  122 QRS Duration: 86 QT Interval:  358 QTC Calculation: 437 R Axis:     Text Interpretation:  Normal sinus rhythm Normal ECG No significant change since last tracing Confirmed by Dorie Rank [phone]) on 01/28/2017 1:08:41 PM Also confirmed by Dorie Rank [phone]), editor Laurena Spies [phone])  on 01/28/2017 1:44:23 PM       Radiology Dg Chest 2 View  Result Date: 01/28/2017 CLINICAL DATA:  Chest pain, shortness of breath. EXAM: CHEST   2 VIEW COMPARISON:  Chest x-ray dated December 15, 2016. FINDINGS: The cardiomediastinal silhouette is normal in size. Normal pulmonary vascularity. No focal consolidation, pleural effusion, or pneumothorax. No acute osseous abnormality. IMPRESSION: No active cardiopulmonary disease. Electronically Signed   By: Titus Dubin M.D.   On: 01/28/2017 13:15    Procedures Procedures (including critical care time)  Medications Ordered in ED Medications - No data to display   Initial Impression / Assessment and Plan / ED Course  I have reviewed the triage vital signs and the nursing notes.  Pertinent labs & imaging results that were available during my care of the patient were reviewed by me and considered in my medical decision making (see chart for details).  Clinical Course as of Jan 28 1533  Fri Jan 28, 2017  1331 Nuc med test in 2011  [JK]  1529 Discussed case with Dr Radford Pax, cardiology.  Based on  his history and risk factors, she recommends admission, cardiac rule out.  [JK]    Clinical Course User Index [JK] Dorie Rank, MD    Pt has Cardiac risk factors of hypercholesterolemia, hypertension and coronary artery disease in the family. She started having chest pain at rest.  Pt's sx are concerning for the possibility of ACS.  Will admit for further workup.  Final Clinical Impressions(s) / ED Diagnoses   Final diagnoses:  Chest pain, unspecified type      Dorie Rank, MD 01/28/17 1534

## 2017-01-28 NOTE — H&P (Signed)
History and Physical    Lawrence Henderson EYC:[id number] DOB: 03/11/1956 DOA: 01/28/2017  PCP: Mayra Neer, MD Patient coming from: home  Chief Complaint: chest pain  HPI: Lawrence Henderson is a pleasant 61 y.o. male with medical history significant for hypertension, hyperlipidemia, obesity since to the emergency department with chief complaint of chest pain. [facility] asked to admit for chest pain rule out  Information is obtained from the patient. He states he's been his usual state of health until yesterday he developed episodes of chest "tightness". He states the tightness located sternal region each episode lasted less than a minute. He states this discomfort is nonradiating rates it a 4-5 out of 10. Associated symptoms include mild diaphoresis nausea without vomiting brief dizziness. He denies headache visual disturbances lower extremity edema cough. He states he took 2 Aleve yesterday and it improved and 2 this morning. 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 He noted the discomfort more so with activity. This morning he states the pain changed and developed a "sharpness" to pain. All the other characteristics of the same. He decided this discomfort was not going to go away so he came to the emergency department. He reports compliance with his medications.  ED Course: In the emergency department he is afebrile hypertensive and not hypoxic. Pain free on admission  Review of Systems: As per HPI otherwise all other systems reviewed and are negative.   Ambulatory Status: Ambulates independently. Is independent with ADLs  Past Medical History:  Diagnosis Date  . Hyperlipidemia   . Hypertension     Past Surgical History:  Procedure Laterality Date  . APPENDECTOMY    . arm surgery     right arm 2006  . TONSILLECTOMY      Social History   Social History  . Marital status: Married    Spouse name: N/A  . Number of children: N/A  . Years of education:  N/A   Occupational History  . Not on file.   Social History Main Topics  . Smoking status: Never Smoker  . Smokeless tobacco: Never Used  . Alcohol use Yes  . Drug use: No  . Sexual activity: Not on file   Other Topics Concern  . Not on file   Social History Narrative  . No narrative on file    Allergies  Allergen Reactions  . Sulfa Antibiotics Anaphylaxis and Swelling    61 years old, Hospitalized for 2 weeks, unknown    History reviewed. No pertinent family history.  Prior to Admission medications   Medication Sig Start Date End Date Taking? Authorizing Provider  aspirin EC 81 MG tablet Take 81 mg by mouth daily.   Yes [provider]  lisinopril (PRINIVIL,ZESTRIL) 20 MG tablet Take 20 mg by mouth daily. 01/13/15  Yes [provider]  Multiple Vitamin (MULTIVITAMIN WITH MINERALS) TABS tablet Take 1 tablet by mouth daily.   Yes [provider]  rosuvastatin (CRESTOR) 20 MG tablet Take 20 mg by mouth daily.   Yes [provider]    Physical Exam: Vitals:   01/28/17 1500 01/28/17 1515 01/28/17 1530 01/28/17 1545  BP: 122/81 (!) 142/96    Pulse: 83 78 80 82  Resp: 14 18 20 18   Temp:      TempSrc:      SpO2: 91% 93% 95% 97%     General:  Appears calm and comfortable, sitting up in bed watching TV. Face flushed Eyes:  PERRL,  EOMI, normal lids, iris ENT:  grossly normal hearing, lips & tongue, mucous membranes of his mouth are moist and pink Neck:  no LAD, masses or thyromegaly Cardiovascular:  RRR, no m/r/g. No LE edema.  Respiratory:  CTA bilaterally, no w/r/r. Normal respiratory effort. Abdomen:  soft, ntnd, obese sluggish bowel sounds no guarding or rebounding Skin:  no rash or induration seen on limited exam Musculoskeletal:  grossly normal tone BUE/BLE, good ROM, no bony abnormality Psychiatric:  grossly normal mood and affect, speech fluent and appropriate, AOx3 Neurologic:  CN 2-12 grossly intact, moves all extremities in  coordinated fashion, sensation intact  Labs on Admission: I have personally reviewed following labs and imaging studies  CBC:  Recent Labs Lab 01/28/17 1258  WBC 8.8  HGB 16.0  HCT 45.8  MCV 86.4  PLT 903   Basic Metabolic Panel:  Recent Labs Lab 01/28/17 1258  NA 137  K 4.1  CL 103  CO2 24  GLUCOSE 100*  BUN 11  CREATININE 0.79  CALCIUM 9.6   GFR: CrCl cannot be calculated (Unknown ideal weight.). Liver Function Tests: No results for input(s): AST, ALT, ALKPHOS, BILITOT, PROT, ALBUMIN in the last 168 hours. No results for input(s): LIPASE, AMYLASE in the last 168 hours. No results for input(s): AMMONIA in the last 168 hours. Coagulation Profile: No results for input(s): INR, PROTIME in the last 168 hours. Cardiac Enzymes: No results for input(s): CKTOTAL, CKMB, CKMBINDEX, TROPONINI in the last 168 hours. BNP (last 3 results) No results for input(s): PROBNP in the last 8760 hours. HbA1C: No results for input(s): HGBA1C in the last 72 hours. CBG: No results for input(s): GLUCAP in the last 168 hours. Lipid Profile: No results for input(s): CHOL, HDL, LDLCALC, TRIG, CHOLHDL, LDLDIRECT in the last 72 hours. Thyroid Function Tests: No results for input(s): TSH, T4TOTAL, FREET4, T3FREE, THYROIDAB in the last 72 hours. Anemia Panel: No results for input(s): VITAMINB12, FOLATE, FERRITIN, TIBC, IRON, RETICCTPCT in the last 72 hours. Urine analysis:    Component Value Date/Time   COLORURINE AMBER (A) 12/15/2016 1342   APPEARANCEUR HAZY (A) 12/15/2016 1342   LABSPEC 1.027 12/15/2016 1342   PHURINE 6.0 12/15/2016 1342   GLUCOSEU NEGATIVE 12/15/2016 1342   HGBUR NEGATIVE 12/15/2016 1342   BILIRUBINUR NEGATIVE 12/15/2016 1342   KETONESUR NEGATIVE 12/15/2016 1342   PROTEINUR 30 (A) 12/15/2016 1342   UROBILINOGEN 0.2 06/14/2007 1305   NITRITE NEGATIVE 12/15/2016 1342   LEUKOCYTESUR NEGATIVE 12/15/2016 1342    Creatinine Clearance: CrCl cannot be calculated  (Unknown ideal weight.).  Sepsis Labs: @LABRCNTIP (procalcitonin:4,lacticidven:4) )No results found for this or any previous visit (from the past 240 hour(s)).   Radiological Exams on Admission: Dg Chest 2 View  Result Date: 01/28/2017 CLINICAL DATA:  Chest pain, shortness of breath. EXAM: CHEST  2 VIEW COMPARISON:  Chest x-ray dated December 15, 2016. FINDINGS: The cardiomediastinal silhouette is normal in size. Normal pulmonary vascularity. No focal consolidation, pleural effusion, or pneumothorax. No acute osseous abnormality. IMPRESSION: No active cardiopulmonary disease. Electronically Signed   By: Titus Dubin M.D.   On: 01/28/2017 13:15    EKG: Independently reviewed. Normal sinus rhythm Normal ECG No significant change since last tracing  Assessment/Plan Principal Problem:   Chest pain Active Problems:   Hypertension   Hyperlipidemia   #1. Chest pain. Heart score 4. Some typical and atypical features. Risk factors include hypertension, hyperlipidemia, family medical history. He had a stress test in 2011 low risk. Initial troponin negative. EKG without any  acute changes. Emergency department provider discussed with cardiology who recommended admission for rule out -Admit to telemetry -Cycle troponin -Serial EKG -Obtain a lipid panel -Aspirin and statin -GI cocktail -If rules out may benefit outpatient stress test that can be arranged by PCP  #2. Hypertension. Only fair control in the emergency department. Home medications include lisinopril -will continue lisinopril -prn hydralazine -frequent vs -may need to agents to control  #3. Hyperlipidemia. Home medications include Crestor. -lipid panel -continue home meds  DVT prophylaxis: lovenox Code Status: full  Family Communication: none present  Disposition Plan: home  Consults called: none  Admission status: obs    Dyanne Carrel M MD [facility]  If 7PM-7AM, please contact  night-coverage [email or web address] Password [email or web address]  01/28/2017, 4:06 PM

## 2017-01-29 ENCOUNTER — Observation Stay (HOSPITAL_BASED_OUTPATIENT_CLINIC_OR_DEPARTMENT_OTHER): Payer: 59

## 2017-01-29 DIAGNOSIS — Z6833 Body mass index (BMI) 33.0-33.9, adult: Secondary | ICD-10-CM | POA: Diagnosis not present

## 2017-01-29 DIAGNOSIS — E782 Mixed hyperlipidemia: Secondary | ICD-10-CM | POA: Diagnosis not present

## 2017-01-29 DIAGNOSIS — R079 Chest pain, unspecified: Secondary | ICD-10-CM

## 2017-01-29 DIAGNOSIS — R072 Precordial pain: Secondary | ICD-10-CM | POA: Diagnosis not present

## 2017-01-29 DIAGNOSIS — I10 Essential (primary) hypertension: Secondary | ICD-10-CM

## 2017-01-29 DIAGNOSIS — R0602 Shortness of breath: Secondary | ICD-10-CM | POA: Diagnosis not present

## 2017-01-29 DIAGNOSIS — E6609 Other obesity due to excess calories: Secondary | ICD-10-CM | POA: Diagnosis not present

## 2017-01-29 DIAGNOSIS — E785 Hyperlipidemia, unspecified: Secondary | ICD-10-CM | POA: Diagnosis not present

## 2017-01-29 LAB — HEPATIC FUNCTION PANEL
ALBUMIN: 4.1 g/dL (ref 3.5–5.0)
ALT: 32 U/L (ref 17–63)
AST: 31 U/L (ref 15–41)
Alkaline Phosphatase: 64 U/L (ref 38–126)
BILIRUBIN DIRECT: 0.1 mg/dL (ref 0.1–0.5)
Indirect Bilirubin: 1.2 mg/dL — ABNORMAL HIGH (ref 0.3–0.9)
Total Bilirubin: 1.3 mg/dL — ABNORMAL HIGH (ref 0.3–1.2)
Total Protein: 7 g/dL (ref 6.5–8.1)

## 2017-01-29 LAB — ECHOCARDIOGRAM COMPLETE
CHL CUP DOP CALC LVOT VTI: 19.7 cm
E decel time: 176 msec
E/e' ratio: 6.4
FS: 34 % (ref 28–44)
HEIGHTINCHES: 72 in
IVS/LV PW RATIO, ED: 1.11
LA ID, A-P, ES: 36 mm
LA vol A4C: 43.2 ml
LA vol index: 22.5 mL/m2
LA vol: 51.5 mL
LADIAMINDEX: 1.57 cm/m2
LEFT ATRIUM END SYS DIAM: 36 mm
LV E/e' medial: 6.4
LV PW d: 9 mm — AB (ref 0.6–1.1)
LV TDI E'LATERAL: 9.41
LV e' LATERAL: 9.41 cm/s
LVEEAVG: 6.4
LVOT SV: 89 mL
LVOT area: 4.52 cm2
LVOT diameter: 24 mm
LVOT peak grad rest: 5 mmHg
LVOT peak vel: 114 cm/s
Lateral S' vel: 18.9 cm/s
MV Dec: 176
MV pk A vel: 83.1 m/s
MVPKEVEL: 60.2 m/s
S' Lateral: 8.84 cm/s
TAPSE: 19.8 mm
TDI e' medial: 8.03
WEIGHTICAEL: 3777.6 [oz_av]

## 2017-01-29 LAB — NM MYOCAR MULTI W/SPECT W/WALL MOTION / EF
CSEPEDS: 3 s
CSEPPHR: 141 {beats}/min
Estimated workload: 9.2 METS
Exercise duration (min): 7 min
MPHR: 160 {beats}/min
Percent HR: 88 %
Rest HR: 88 {beats}/min

## 2017-01-29 LAB — D-DIMER, QUANTITATIVE (NOT AT ARMC): D DIMER QUANT: 0.64 ug{FEU}/mL — AB (ref 0.00–0.50)

## 2017-01-29 LAB — TROPONIN I
Troponin I: <0.03 ng/mL (ref <0.03)
Troponin I: <0.03 ng/mL (ref <0.03)

## 2017-01-29 MED ORDER — TECHNETIUM TC 99M TETROFOSMIN IV KIT
10.0000 | PACK | Freq: Once | INTRAVENOUS | Status: AC | PRN
  Administered 2017-01-29: 10 via INTRAVENOUS

## 2017-01-29 MED ORDER — TECHNETIUM TC 99M TETROFOSMIN IV KIT
30.0000 | PACK | Freq: Once | INTRAVENOUS | Status: AC | PRN
  Administered 2017-01-29: 30 via INTRAVENOUS

## 2017-01-29 NOTE — Care Management Note (Signed)
Case Management Note  Patient Details  Name: ADRIELL POLANSKY MRN: [id number] Date of Birth: August 12, 1955  Subjective/Objective:                 Patient with order to DC to home today. Chart reviewed. No Home Health or Equipment needs, no unacknowledged Case Management consults or medication needs identified at the time of this note. Plan for DC to home. If needs arise today prior to discharge, please call Carles Collet RN CM at [phone].    Action/Plan:   Expected Discharge Date:  01/29/17               Expected Discharge Plan:  Home/Self Care  In-House Referral:     Discharge planning Services  CM Consult  Post Acute Care Choice:    Choice offered to:     DME Arranged:    DME Agency:     HH Arranged:    HH Agency:     Status of Service:  Completed, signed off  If discussed at [employer] of Stay Meetings, dates discussed:    Additional Comments:  Carles Collet, RN 01/29/2017, 5:02 PM

## 2017-01-29 NOTE — Progress Notes (Signed)
  Echocardiogram 2D Echocardiogram has been performed.  Lawrence Henderson 01/29/2017, 11:39 AM

## 2017-01-29 NOTE — Consult Note (Signed)
Cardiology Consultation:   Patient ID: Lawrence Henderson; [id number]; October 04, 1955   Admit date: 01/28/2017 Date of Consult: 01/29/2017  Primary Care Provider: Mayra Neer, MD Primary Cardiologist: new    Patient Profile:   Lawrence Henderson is a 61 y.o. male with a hx of HTN, HLP, obesity who is being seen today for the evaluation of chest pain at the request of Dr. Wendee Beavers.  History of Present Illness:   [name] presents with approximately 24-hour history of recurrent episodes of retrosternal chest tightness that has been present constantly for about 48 hours, interspersed with brief episodes of sharp chest pain lasting for only a minute at a time, but recurring throughout the day, moderate in severity, associated with diaphoresis, nausea, brief dizziness without fever or chills, cough, vomiting or syncope. There is is no pattern of worsening with physical activity. NSAIDs provided some relief, but symptoms recurred after several hours.   Cardiac biomarkers, ECG and chest x-ray are all within normal limits. An echocardiogram has been ordered. His last lipid profile shows hypertriglyceridemia (333) but a good HDL cholesterol (53) and a fairly good LDL level at 100. His blood pressure is generally well controlled. He does not have diabetes mellitus and he has never smoked.  In June, he had a febrile illness that lasted for several days and elevation in his liver function tests. A right upper quadrant ultrasound showed fatty liver, but no gallstones or other abnormalities. Retrospectively, it's possible that his symptoms were related to a snake bite. His statin was temporarily interrupted for the abnormal LFTs and just recently restarted.  CT of the abdomen and pelvis performed earlier this year was remarkable for the absence of significant atherosclerosis in the aorta or visceral arteries. Most of the heart is also included in the CT of the abdomen and there is no visible calcification in the  distribution of the coronary arteries  Past Medical History:  Diagnosis Date  . Hyperlipidemia   . Hypertension     Past Surgical History:  Procedure Laterality Date  . APPENDECTOMY    . arm surgery     right arm 2006  . TONSILLECTOMY       Inpatient Medications: Scheduled Meds: . aspirin EC  325 mg Oral Daily  . enoxaparin (LOVENOX) injection  40 mg Subcutaneous Q24H  . lisinopril  20 mg Oral Daily  . rosuvastatin  40 mg Oral Daily   Continuous Infusions:  PRN Meds: acetaminophen, gi cocktail, hydrALAZINE, morphine injection, naproxen sodium, ondansetron (ZOFRAN) IV  Allergies:    Allergies  Allergen Reactions  . Sulfa Antibiotics Anaphylaxis and Swelling    61 years old, Hospitalized for 2 weeks, unknown    Social History:   Social History   Social History  . Marital status: Married    Spouse name: N/A  . Number of children: N/A  . Years of education: N/A   Occupational History  . Not on file.   Social History Main Topics  . Smoking status: Never Smoker  . Smokeless tobacco: Never Used  . Alcohol use Yes  . Drug use: No  . Sexual activity: Not on file   Other Topics Concern  . Not on file   Social History Narrative  . No narrative on file    Family History:   History reviewed. No pertinent family history.   ROS:  Please see the history of present illness.  ROS  All other ROS reviewed and negative.     Physical Exam/Data:  Vitals:   01/28/17 2020 01/28/17 2300 01/29/17 0000 01/29/17 0400  BP: 129/82 135/88 135/88 (!) 137/91  Pulse: 89 84 84 85  Resp: 18 18 18 18   Temp: 98 F (36.7 C) 98.3 F (36.8 C) 98.3 F (36.8 C) 98.1 F (36.7 C)  TempSrc: Oral Oral Oral Oral  SpO2: 96%  96% 95%  Weight:    236 lb 1.6 oz (107.1 kg)  Height:        Intake/Output Summary (Last 24 hours) at 01/29/17 0809 Last data filed at 01/28/17 2300  Gross per 24 hour  Intake              360 ml  Output                0 ml  Net              360 ml    Filed Weights   01/28/17 1833 01/29/17 0400  Weight: 239 lb 9.6 oz (108.7 kg) 236 lb 1.6 oz (107.1 kg)   Body mass index is 32.02 kg/m.  General:  Well nourished, well developed, in no acute distress. His exam is limited by obesity HEENT: normal Lymph: no adenopathy Neck: no JVD Endocrine:  No thryomegaly Vascular: No carotid bruits; FA pulses 2+ bilaterally without bruits  Cardiac:  normal S1, S2; RRR; no murmur  Lungs:  clear to auscultation bilaterally, no wheezing, rhonchi or rales  Abd: soft, nontender, no hepatomegaly  Ext: no edema Musculoskeletal:  No deformities, BUE and BLE strength normal and equal Skin: warm and dry  Neuro:  CNs 2-12 intact, no focal abnormalities noted Psych:  Normal affect   EKG:  The EKG was personally reviewed and demonstrates:  NSR, no evidence of chamber enlargement or repolarization abnormalities Telemetry:  Telemetry was personally reviewed and demonstrates:  Normal sinus rhythm  Relevant CV Studies: Echo ordered and pending  Laboratory Data:  Chemistry Recent Labs Lab 01/28/17 1258  NA 137  K 4.1  CL 103  CO2 24  GLUCOSE 100*  BUN 11  CREATININE 0.79  CALCIUM 9.6  GFRNONAA >60  GFRAA >60  ANIONGAP 10    No results for input(s): PROT, ALBUMIN, AST, ALT, ALKPHOS, BILITOT in the last 168 hours. Hematology Recent Labs Lab 01/28/17 1258  WBC 8.8  RBC 5.30  HGB 16.0  HCT 45.8  MCV 86.4  MCH 30.2  MCHC 34.9  RDW 13.2  PLT 217   Cardiac Enzymes Recent Labs Lab 01/28/17 1932  TROPONINI <0.03    Recent Labs Lab 01/28/17 1309 01/28/17 1621  TROPIPOC 0.00 0.00    BNPNo results for input(s): BNP, PROBNP in the last 168 hours.  DDimer No results for input(s): DDIMER in the last 168 hours.  Radiology/Studies:  Dg Chest 2 View  Result Date: 01/28/2017 CLINICAL DATA:  Chest pain, shortness of breath. EXAM: CHEST  2 VIEW COMPARISON:  Chest x-ray dated December 15, 2016. FINDINGS: The cardiomediastinal silhouette is  normal in size. Normal pulmonary vascularity. No focal consolidation, pleural effusion, or pneumothorax. No acute osseous abnormality. IMPRESSION: No active cardiopulmonary disease. Electronically Signed   By: Titus Dubin M.D.   On: 01/28/2017 13:15    Assessment and Plan:   1. Atypical chest discomfort with low risk ECG,  biomarkers and absent coronary calcification on chest CT in a patient with limited coronary risk factors (well treated hypertension, treated hypercholesterolemia with mild residual hypertriglyceridemia, obesity). The likelihood of acute coronary syndrome is low, but his symptoms remain  unexplained. It's reasonable to proceed with a plain treadmill stress test and echocardiogram.  2. HTN: Well controlled on current medication. 3. HLP: Statin recently resumed. Hypertriglyceridemia is likely related to obesity, but he does not have diabetes mellitus initially has a good HDL. Exercise and weight loss would be beneficial.negative 4. Abnormal transaminases: Possibly related to a nonvenomous snake bite and subsequent febrile illness, will recheck today    Signed, Sanda Klein, MD  01/29/2017 8:09 AM

## 2017-01-29 NOTE — Progress Notes (Signed)
Received phone call from [place] that pt's ST was low risk and made Dr. Wendee Beavers aware.

## 2017-01-29 NOTE — Discharge Summary (Signed)
Physician Discharge Summary  Lawrence Henderson GYJ:[id number] DOB: 1955-07-06 DOA: 01/28/2017  PCP: Mayra Neer, MD  Admit date: 01/28/2017 Discharge date: 01/29/2017  Time spent:> 35 minutes  Recommendations for Outpatient Follow-up:   Continue to work of chest discomfort as outpatient. Cardiac workup in house negative.   Discharge Diagnoses:  Principal Problem:   Chest pain Active Problems:   Hypertension   Hyperlipidemia   Obesity   Discharge Condition: stable  Diet recommendation: Heart healthy  Filed Weights   01/28/17 1833 01/29/17 0400  Weight: 108.7 kg (239 lb 9.6 oz) 107.1 kg (236 lb 1.6 oz)    History of present illness:   61 y.o. male with a Past Medical History htn, hld, who presents with cp. Cardiology was subsequently consulted and assisted with workup  Hospital Course:  Chest pain -Cardiology consulted and assisted with workup. Troponins negative 4 - The patient had nuclear stress test which reported the following:Low risk stress nuclear study with normal perfusion and normal left ventricular regional and global systolic function. -Chest x-ray reported the following: No active cardiopulmonary disease  - Recommend follow-up with her primary care physician for further evaluation recommendations.  Otherwise no medical problems prior to admission we'll continue home medication regimen  Procedures:  Nuclear stress test  Consultations:  Cardiology  Discharge Exam: Vitals:   01/29/17 1400 01/29/17 1500  BP: 126/84 110/70  Pulse:  90  Resp:  18  Temp:  97.9 F (36.6 C)    General: Patient in no acute distress, alert and awake Cardiovascular: Regular rate and rhythm, no murmurs or rubs Respiratory: No increased work of breathing, no wheezes  Discharge Instructions   Discharge Instructions    Call MD for:  difficulty breathing, headache or visual disturbances    Complete by:  As directed    Call MD for:  temperature >100.4    Complete by:  As  directed    Diet - low sodium heart healthy    Complete by:  As directed    Discharge instructions    Complete by:  As directed    Please follow-up with your primary care physician for further evaluation recommendations for chest discomfort. Cardiac workup negative while in the hospital.   Increase activity slowly    Complete by:  As directed      Current Discharge Medication List    CONTINUE these medications which have NOT CHANGED   Details  aspirin EC 81 MG tablet Take 81 mg by mouth daily.    lisinopril (PRINIVIL,ZESTRIL) 20 MG tablet Take 20 mg by mouth daily.    Multiple Vitamin (MULTIVITAMIN WITH MINERALS) TABS tablet Take 1 tablet by mouth daily.    rosuvastatin (CRESTOR) 20 MG tablet Take 20 mg by mouth daily.      STOP taking these medications     naproxen sodium (ANAPROX) 220 MG tablet        Allergies  Allergen Reactions  . Sulfa Antibiotics Anaphylaxis and Swelling    61 years old, Hospitalized for 2 weeks, unknown      The results of significant diagnostics from this hospitalization (including imaging, microbiology, ancillary and laboratory) are listed below for reference.    Significant Diagnostic Studies: Dg Chest 2 View  Result Date: 01/28/2017 CLINICAL DATA:  Chest pain, shortness of breath. EXAM: CHEST  2 VIEW COMPARISON:  Chest x-ray dated December 15, 2016. FINDINGS: The cardiomediastinal silhouette is normal in size. Normal pulmonary vascularity. No focal consolidation, pleural effusion, or pneumothorax. No acute osseous abnormality.  IMPRESSION: No active cardiopulmonary disease. Electronically Signed   By: Titus Dubin M.D.   On: 01/28/2017 13:15   Nm Myocar Multi W/spect W/wall Motion / Ef  Result Date: 01/29/2017  Blood pressure demonstrated a hypertensive response to exercise.  There was no ST segment deviation noted during stress.  The study is normal.  This is a low risk study.  Nuclear stress EF: 56%.  Low risk stress nuclear study with  normal perfusion and normal left ventricular regional and global systolic function.    Microbiology: No results found for this or any previous visit (from the past 240 hour(s)).   Labs: Basic Metabolic Panel:  Recent Labs Lab 01/28/17 1258  NA 137  K 4.1  CL 103  CO2 24  GLUCOSE 100*  BUN 11  CREATININE 0.79  CALCIUM 9.6   Liver Function Tests:  Recent Labs Lab 01/29/17 1035  AST 31  ALT 32  ALKPHOS 64  BILITOT 1.3*  PROT 7.0  ALBUMIN 4.1   No results for input(s): LIPASE, AMYLASE in the last 168 hours. No results for input(s): AMMONIA in the last 168 hours. CBC:  Recent Labs Lab 01/28/17 1258  WBC 8.8  HGB 16.0  HCT 45.8  MCV 86.4  PLT 217   Cardiac Enzymes:  Recent Labs Lab 01/28/17 1932 01/29/17 1035  TROPONINI <0.03 <0.03   BNP: BNP (last 3 results) No results for input(s): BNP in the last 8760 hours.  ProBNP (last 3 results) No results for input(s): PROBNP in the last 8760 hours.  CBG: No results for input(s): GLUCAP in the last 168 hours.   Signed:  Velvet Bathe MD.  [facility] 01/29/2017, 4:19 PM

## 2017-01-29 NOTE — Progress Notes (Signed)
Discharge instructions reviewed with pt. Pt has no questions at this time. Pt states he is ready for d/c. Iv d/c.

## 2017-02-01 LAB — HIV ANTIBODY (ROUTINE TESTING W REFLEX): HIV Screen 4th Generation wRfx: NONREACTIVE

## 2018-07-09 ENCOUNTER — Emergency Department (HOSPITAL_COMMUNITY)
Admission: EM | Admit: 2018-07-09 | Discharge: 2018-07-09 | Disposition: A | Discharge status: Home / Self Care | Payer: 59 | Attending: Emergency Medicine | Admitting: Emergency Medicine

## 2018-07-09 ENCOUNTER — Other Ambulatory Visit: Payer: Self-pay

## 2018-07-09 ENCOUNTER — Encounter (HOSPITAL_COMMUNITY): Payer: Self-pay | Admitting: Emergency Medicine

## 2018-07-09 DIAGNOSIS — I10 Essential (primary) hypertension: Secondary | ICD-10-CM | POA: Insufficient documentation

## 2018-07-09 DIAGNOSIS — R21 Rash and other nonspecific skin eruption: Secondary | ICD-10-CM | POA: Diagnosis present

## 2018-07-09 DIAGNOSIS — L519 Erythema multiforme, unspecified: Secondary | ICD-10-CM | POA: Diagnosis not present

## 2018-07-09 DIAGNOSIS — Z79899 Other long term (current) drug therapy: Secondary | ICD-10-CM | POA: Diagnosis not present

## 2018-07-09 DIAGNOSIS — Z7982 Long term (current) use of aspirin: Secondary | ICD-10-CM | POA: Diagnosis not present

## 2018-07-09 LAB — COMPREHENSIVE METABOLIC PANEL
ALK PHOS: 49 U/L (ref 38–126)
ALT: 33 U/L (ref 0–44)
AST: 23 U/L (ref 15–41)
Albumin: 4.5 g/dL (ref 3.5–5.0)
Anion gap: 8 (ref 5–15)
BUN: 16 mg/dL (ref 8–23)
CALCIUM: 9.4 mg/dL (ref 8.9–10.3)
CHLORIDE: 107 mmol/L (ref 98–111)
CO2: 25 mmol/L (ref 22–32)
CREATININE: 0.8 mg/dL (ref 0.61–1.24)
GFR calc non Af Amer: >60 mL/min (ref >60)
Glucose, Bld: 118 mg/dL — ABNORMAL HIGH (ref 70–99)
Potassium: 3.9 mmol/L (ref 3.5–5.1)
SODIUM: 140 mmol/L (ref 135–145)
Total Bilirubin: 0.9 mg/dL (ref 0.3–1.2)
Total Protein: 7.2 g/dL (ref 6.5–8.1)

## 2018-07-09 LAB — LACTIC ACID, PLASMA: Lactic Acid, Venous: 2 mmol/L (ref 0.5–1.9)

## 2018-07-09 LAB — CBC
HCT: 45.6 % (ref 39.0–52.0)
Hemoglobin: 15.2 g/dL (ref 13.0–17.0)
MCH: 30 pg (ref 26.0–34.0)
MCHC: 33.3 g/dL (ref 30.0–36.0)
MCV: 90.1 fL (ref 80.0–100.0)
NRBC: 0 % (ref 0.0–0.2)
PLATELETS: 189 10*3/uL (ref 150–400)
RBC: 5.06 MIL/uL (ref 4.22–5.81)
RDW: 12.5 % (ref 11.5–15.5)
WBC: 11.1 10*3/uL — ABNORMAL HIGH (ref 4.0–10.5)

## 2018-07-09 LAB — CK: Total CK: 80 U/L (ref 49–397)

## 2018-07-09 LAB — C-REACTIVE PROTEIN: CRP: 0.9 mg/dL (ref <1.0)

## 2018-07-09 LAB — SEDIMENTATION RATE: Sed Rate: 2 mm/hr (ref 0–16)

## 2018-07-09 MED ORDER — PREDNISONE 20 MG PO TABS: ORAL_TABLET | ORAL | 0 refills | Status: DC

## 2018-07-09 MED ORDER — DIPHENHYDRAMINE HCL 50 MG/ML IJ SOLN
50.0000 mg | Freq: Once | INTRAMUSCULAR | Status: AC
  Administered 2018-07-09: 50 mg via INTRAVENOUS
  Filled 2018-07-09: qty 1

## 2018-07-09 MED ORDER — HYDROXYZINE HCL 25 MG PO TABS
25.0000 mg | ORAL_TABLET | Freq: Once | ORAL | Status: AC
  Administered 2018-07-09: 25 mg via ORAL
  Filled 2018-07-09: qty 1

## 2018-07-09 MED ORDER — PREDNISONE 20 MG PO TABS
60.0000 mg | ORAL_TABLET | Freq: Once | ORAL | Status: AC
  Administered 2018-07-09: 60 mg via ORAL
  Filled 2018-07-09: qty 3

## 2018-07-09 MED ORDER — DIPHENHYDRAMINE HCL 25 MG PO TABS: 25.0000 mg | ORAL_TABLET | Freq: Four times a day (QID) | ORAL | 0 refills | Status: DC

## 2018-07-09 MED ORDER — KETOROLAC TROMETHAMINE 30 MG/ML IJ SOLN
30.0000 mg | Freq: Once | INTRAMUSCULAR | Status: AC
  Administered 2018-07-09: 30 mg via INTRAVENOUS
  Filled 2018-07-09: qty 1

## 2018-07-09 MED ORDER — FAMOTIDINE 20 MG PO TABS
20.0000 mg | ORAL_TABLET | Freq: Once | ORAL | Status: AC
  Administered 2018-07-09: 20 mg via ORAL
  Filled 2018-07-09: qty 1

## 2018-07-09 MED ORDER — FENTANYL CITRATE (PF) 100 MCG/2ML IJ SOLN
50.0000 ug | Freq: Once | INTRAMUSCULAR | Status: AC
  Administered 2018-07-09: 50 ug via INTRAVENOUS
  Filled 2018-07-09: qty 2

## 2018-07-09 MED ORDER — LISINOPRIL 20 MG PO TABS
20.0000 mg | ORAL_TABLET | Freq: Once | ORAL | Status: AC
  Administered 2018-07-09: 20 mg via ORAL
  Filled 2018-07-09: qty 1

## 2018-07-09 MED ORDER — DOXYCYCLINE HYCLATE 100 MG PO TABS
100.0000 mg | ORAL_TABLET | Freq: Once | ORAL | Status: AC
  Administered 2018-07-09: 100 mg via ORAL
  Filled 2018-07-09: qty 1

## 2018-07-09 NOTE — ED Notes (Signed)
He tells me he feels "better". He also states he is less itchy. His wife remains at his bedside. Bowie, our P.A. has just spoken with them and examined him. And informed them we will be watching him for some time, with which they agree.

## 2018-07-09 NOTE — ED Notes (Signed)
Date and time results received: 07/09/18 8:46 AM (use smartphrase ".now" to insert current time)  Test: Lactic Critical Value: 2.0  Name of Provider Notified: Steinl  Orders Received? Or Actions Taken?: awaiting orders

## 2018-07-09 NOTE — ED Provider Notes (Signed)
Medical screening examination/treatment/procedure(s) were conducted as a shared visit with non-physician practitioner(s) and myself.  I personally evaluated the patient during the encounter.  Here with nonspecific rash.  Patient states that over the last 24 hours is a progressively worsening rash that does not get better with Benadryl.  Prior to my evaluation he started doxycycline, prednisone, Benadryl and Atarax without significant improvement. On my exam patient appears well and nontoxic.  He has multiple blanching rashes all over his skin.  No excoriations.  No Nikolsky sign. Here with rash. Seems most consistent with possibly erythema multiforme. Patient appears non toxic, well hydrated and in no distress. Doubt meningococcemia, SJS, anaphylaxis, cellulitis, RMSF or toxic shock as cause for rash. Will suggest adding on esr/crp/ck with more symptomatic treatment. Observe for a couple hours and have close pcp follow up otherwise return here for new/worsening symptoms.    Joyice Magda, Corene Cornea, MD 07/09/18 [phone]

## 2018-07-09 NOTE — ED Provider Notes (Signed)
[facility] DEPT Provider Note   CSN: [id number] Arrival date & time: 07/09/18  0208     History   Chief Complaint Chief Complaint  Patient presents with  . Rash  . Insect Bite    possible    HPI Lawrence Henderson is a 63 y.o. male with a history of HLD and HTN who presents to the emergency department with a chief complaint of rash.  The patient reports he noticed a circular area on his inner right thigh 2 nights ago after he felt a "stinging" sensation while sitting and watching tv in his chair.  His wife states the area looked as if it had a central clearing.  He reports he then noticed a similar rash on his left thigh, bilateral hands, bilateral axilla, and bilateral feet.  He also reports that the area on the right thigh significantly enlarged since onset.  He reports the area is pruritic, but not painful.  He has been taking Benadryl without improvement.  He reports the pain in his feet initially began on the left, but has since spread to his right heel since arrival in the ED.  He denies fever, chills, headache, cough, shortness of breath, nausea, vomiting, diarrhea.  He reports he has been having some muscle cramping in his legs and ribs intermittently since the rash began.  He also reports cold symptoms about a week and 1/2 to 2 weeks ago that have since resolved.  He states he did not follow-up with primary care regarding the symptoms as they resolved after taking over-the-counter medications.  He also reports that he finished up a course of Augmentin yesterday for cellulitis on his left lower leg.  Reports a history of cellulitis on his left lower leg several years ago before the recurrent episode over the last few weeks.  No history of herpes zoster.  No new soaps, detergents, or lotions.  No new sleeping environment or new clothes.  No other family members in his household have similar rash.  He is having no other joint pain and has no family  history is with history of autoimmune or rheumatological disorders.  The history is provided by the patient. No language interpreter was used.    Past Medical History:  Diagnosis Date  . Hyperlipidemia   . Hypertension     Patient Active Problem List   Diagnosis Date Noted  . Chest pain 01/28/2017  . Hypertension 01/28/2017  . Obesity 01/28/2017  . Hyperlipidemia     Past Surgical History:  Procedure Laterality Date  . APPENDECTOMY    . arm surgery     right arm 2006  . TONSILLECTOMY          Home Medications    Prior to Admission medications   Medication Sig Start Date End Date Taking? Authorizing Provider  aspirin EC 81 MG tablet Take 81 mg by mouth daily.   Yes [provider]  co-enzyme Q-10 30 MG capsule Take 30 mg by mouth 3 (three) times daily.   Yes [provider]  lisinopril (PRINIVIL,ZESTRIL) 20 MG tablet Take 20 mg by mouth daily. 01/13/15  Yes [provider]  Multiple Vitamin (MULTIVITAMIN WITH MINERALS) TABS tablet Take 1 tablet by mouth daily.   Yes [provider]  rosuvastatin (CRESTOR) 20 MG tablet Take 20 mg by mouth daily.   Yes [provider]  vitamin C (ASCORBIC ACID) 500 MG tablet Take 500 mg by mouth daily.   Yes [provider]  Family History History reviewed. No pertinent family history.  Social History Social History   Tobacco Use  . Smoking status: Never Smoker  . Smokeless tobacco: Never Used  Substance Use Topics  . Alcohol use: Yes  . Drug use: No     Allergies   Sulfa antibiotics   Review of Systems Review of Systems  Constitutional: Negative for appetite change and fever.  Respiratory: Negative for shortness of breath.   Cardiovascular: Negative for chest pain.  Gastrointestinal: Negative for abdominal pain.  Genitourinary: Negative for dysuria.  Musculoskeletal: Negative for back pain.  Skin: Positive for color change and rash. Negative for wound.    Allergic/Immunologic: Negative for immunocompromised state.  Neurological: Negative for weakness and headaches.  Psychiatric/Behavioral: Negative for confusion.   Physical Exam Updated Vital Signs BP (!) 146/99 (BP Location: Left Arm)   Pulse 81   Temp (!) 97.5 F (36.4 C) (Oral)   Resp 17   Ht 5\' 11"  (1.803 m)   Wt 110.2 kg   SpO2 95%   BMI 33.89 kg/m   Physical Exam Vitals signs and nursing note reviewed.  Constitutional:      Appearance: He is well-developed.  HENT:     Head: Normocephalic.     Comments: Oral mucosa is unremarkable Eyes:     Conjunctiva/sclera: Conjunctivae normal.  Neck:     Musculoskeletal: Neck supple.  Cardiovascular:     Rate and Rhythm: Normal rate and regular rhythm.     Heart sounds: No murmur.  Pulmonary:     Effort: Pulmonary effort is normal. No respiratory distress.     Breath sounds: No stridor. No wheezing, rhonchi or rales.  Chest:     Chest wall: No tenderness.  Abdominal:     General: There is no distension.     Palpations: Abdomen is soft.     Comments: Abdomen is soft, nontender, nondistended.  Skin:    General: Skin is warm and dry.     Capillary Refill: Capillary refill takes less than 2 seconds.     Comments: Large erythematous plaque noted to the right medial thigh with minimal warmth.  There appear to be several small circular satellite lesions distal to the plaque.  Erythematous, circular lesions noted to the left medial thigh.  One lesion appears to have a central clearing.  Multiple flesh-colored papules are noted to the dorsum of the bilateral hands and fingers.  There are several erythematous circular plaques noted to the bilateral palms and feet.  A single linear lesion is noted to the right chest wall along with few erythematous circular papules.  Trunk, lower legs, forearms, and upper arms are spared.   Neurological:     Mental Status: He is alert.  Psychiatric:        Behavior: Behavior normal.                            ED Treatments / Results  Labs (all labs ordered are listed, but only abnormal results are displayed) Labs Reviewed  CBC - Abnormal; Notable for the following components:      Result Value   WBC 11.1 (*)    All other components within normal limits  COMPREHENSIVE METABOLIC PANEL - Abnormal; Notable for the following components:   Glucose, Bld 118 (*)    All other components within normal limits  SEDIMENTATION RATE  C-REACTIVE PROTEIN  CK  LACTIC ACID, PLASMA    EKG None  Radiology No results found.  Procedures Procedures (including critical care time)  Medications Ordered in ED Medications  hydrOXYzine (ATARAX/VISTARIL) tablet 25 mg (25 mg Oral Given 07/09/18 0422)  famotidine (PEPCID) tablet 20 mg (20 mg Oral Given 07/09/18 0422)  doxycycline (VIBRA-TABS) tablet 100 mg (100 mg Oral Given 07/09/18 0422)  diphenhydrAMINE (BENADRYL) injection 50 mg (50 mg Intravenous Given 07/09/18 0600)  predniSONE (DELTASONE) tablet 60 mg (60 mg Oral Given 07/09/18 0600)  ketorolac (TORADOL) 30 MG/ML injection 30 mg (30 mg Intravenous Given 07/09/18 0722)  fentaNYL (SUBLIMAZE) injection 50 mcg (50 mcg Intravenous Given 07/09/18 6834)     Initial Impression / Assessment and Plan / ED Course  I have reviewed the triage vital signs and the nursing notes.  Pertinent labs & imaging results that were available during my care of the patient were reviewed by me and considered in my medical decision making (see chart for details).     63 year old male with a history of HLD and HTN setting with a rash that initially began on the right medial thigh at this circular lesion approximately the size of a 50 cent piece that has since spread to his left medial thigh, bilateral hands, bilateral heels, and bilateral chest wall.  Rash has spread in a centripetal manner.  No constitutional symptoms, but reports he did have viral symptoms about a week ago.  He did  finish a course of Augmentin recently for cellulitis of the left lower leg.  No known sick contacts.  Differential diagnosis includes intertrigo, contact dermatitis, cellulitis, MRSA infection, erythema multiforme, dyshidrotic eczema, or cutaneous manifestation of systemic disease such as ulcerative colitis.  Low suspicion for herpes zoster, urticaria.  Also considered Lyme disease versus [place] spotted fever.   Rash is not beefy red and is also located on the palms and soles. so lower suspicion for candidal intertrigo.  Low suspicion for tickborne illness since the patient has not had headache or fever, but it is also possible fever has been masked since he has been taking Augmentin for cellulitis.  He does appear to be a 1-2 lesions that look concerning for pustules on the dorsum of the right hand feels possible could be a MRSA infection, which Augmentin would not cover.  There are portions of the rash, particularly on the hands and feet that look more concerning for dyshidrotic eczema.  The patient was given Vistaril with no improvement of pruritus.  He was then treated with IV Benadryl and prednisone given concern for possible contact dermatitis.  He reports pruritus improved after these medications, but the redness and the pain in his heels has not started to improve.  He does also state he was having some cramping in his bilateral thighs noted muscles of the trunk.  Electrolytes were unremarkable, but will add on a CK level.  Patient was seen and evaluated along with Dr. Dayna Barker, attending physician.  At this time, since he did have cold-like symptoms, I am most concerned for erythema multiforme.  However, given concern for MRSA will cover with doxycycline in the ED.  Dr. Dayna Barker is also recommended Toradol and fentanyl to see if pain improves.    Patient care transferred to [facility] at the end of my shift for reevaluation and follow-up on labs. Patient presentation, ED course, and plan of care discussed  with review of all pertinent labs and imaging. Please see his/her note for further details regarding further ED course and disposition.  Final Clinical Impressions(s) / ED Diagnoses  Final diagnoses:  None    ED Discharge Orders    None       Joanne Gavel, PA-C 07/09/18 0737    Mesner, Corene Cornea, MD 07/09/18 [phone]

## 2018-07-09 NOTE — ED Triage Notes (Signed)
Patient states he started having red splotches on the inside of both thighs. Patient states it is real itchy. Patient has taken 50 mg of benadryl and the benadryl is not working.

## 2018-07-09 NOTE — ED Provider Notes (Signed)
Received patient care at the beginning of shift.  Patient presents with a diffuse rash that is itchy and mildly tender within the last 24 hours.  It initially started in the groin and now spread to the extremities.  The rash is being seen evaluate by Dr. Dayna Barker as well as PA Mia.  We felt that the rash is likely erythema multiforme and less likely to be SJS, TEN, meningococcemia, toxic shock, anaphylaxis, or other life threatening rash.  Patient has been monitored for the past 7 hours after receiving Benadryl and prednisone.  Rash is improving and patient felt better.  Reassurance given.  Patient is stable for discharge.  He will follow-up closely with PCP for further care.  Return precaution discussed.  BP 133/90   Pulse 84   Temp (!) 97.5 F (36.4 C) (Oral)   Resp 17   Ht 5\' 11"  (1.803 m)   Wt 110.2 kg   SpO2 93%   BMI 33.89 kg/m   Results for orders placed or performed during the hospital encounter of 07/09/18  CBC  Result Value Ref Range   WBC 11.1 (H) 4.0 - 10.5 K/uL   RBC 5.06 4.22 - 5.81 MIL/uL   Hemoglobin 15.2 13.0 - 17.0 g/dL   HCT 45.6 39.0 - 52.0 %   MCV 90.1 80.0 - 100.0 fL   MCH 30.0 26.0 - 34.0 pg   MCHC 33.3 30.0 - 36.0 g/dL   RDW 12.5 11.5 - 15.5 %   Platelets 189 150 - 400 K/uL   nRBC 0.0 0.0 - 0.2 %  Comprehensive metabolic panel  Result Value Ref Range   Sodium 140 135 - 145 mmol/L   Potassium 3.9 3.5 - 5.1 mmol/L   Chloride 107 98 - 111 mmol/L   CO2 25 22 - 32 mmol/L   Glucose, Bld 118 (H) 70 - 99 mg/dL   BUN 16 8 - 23 mg/dL   Creatinine, Ser 0.80 0.61 - 1.24 mg/dL   Calcium 9.4 8.9 - 10.3 mg/dL   Total Protein 7.2 6.5 - 8.1 g/dL   Albumin 4.5 3.5 - 5.0 g/dL   AST 23 15 - 41 U/L   ALT 33 0 - 44 U/L   Alkaline Phosphatase 49 38 - 126 U/L   Total Bilirubin 0.9 0.3 - 1.2 mg/dL   GFR calc non Af Amer >60 >60 mL/min   GFR calc Af Amer >60 >60 mL/min   Anion gap 8 5 - 15  Sedimentation rate  Result Value Ref Range   Sed Rate 2 0 - 16 mm/hr  C-reactive  protein  Result Value Ref Range   CRP 0.9 <1.0 mg/dL  CK  Result Value Ref Range   Total CK 80 49 - 397 U/L  Lactic acid, plasma  Result Value Ref Range   Lactic Acid, Venous 2.0 (HH) 0.5 - 1.9 mmol/L   No results found.    Domenic Moras, PA-C 07/09/18 9924    Lajean Saver, MD 07/09/18 [phone]

## 2018-07-09 NOTE — Discharge Instructions (Signed)
Your rash is likely erythema multiforme, and acute immune mediated disorder.  This is a self-limiting condition and will resolved within a few weeks without any significant complication.  You may take Benadryl as needed for itchiness, and steroid to help suppress the immune response.  Follow up closely with your doctor.  Return promptly if you have any concerns. Your labs are reassuring today.

## 2020-01-17 ENCOUNTER — Encounter (HOSPITAL_COMMUNITY): Payer: Self-pay | Admitting: Emergency Medicine

## 2020-01-17 ENCOUNTER — Emergency Department (HOSPITAL_COMMUNITY): Payer: 59

## 2020-01-17 ENCOUNTER — Inpatient Hospital Stay (HOSPITAL_COMMUNITY)
Admission: EM | Admit: 2020-01-17 | Discharge: 2020-01-24 | DRG: 177 | Disposition: A | Discharge status: Home / Self Care | Payer: 59 | Attending: Internal Medicine | Admitting: Internal Medicine

## 2020-01-17 ENCOUNTER — Other Ambulatory Visit: Payer: Self-pay

## 2020-01-17 DIAGNOSIS — J9601 Acute respiratory failure with hypoxia: Secondary | ICD-10-CM | POA: Diagnosis present

## 2020-01-17 DIAGNOSIS — J069 Acute upper respiratory infection, unspecified: Secondary | ICD-10-CM | POA: Diagnosis not present

## 2020-01-17 DIAGNOSIS — Z7289 Other problems related to lifestyle: Secondary | ICD-10-CM | POA: Diagnosis not present

## 2020-01-17 DIAGNOSIS — R7303 Prediabetes: Secondary | ICD-10-CM

## 2020-01-17 DIAGNOSIS — Z79899 Other long term (current) drug therapy: Secondary | ICD-10-CM | POA: Diagnosis not present

## 2020-01-17 DIAGNOSIS — Z713 Dietary counseling and surveillance: Secondary | ICD-10-CM | POA: Diagnosis not present

## 2020-01-17 DIAGNOSIS — E669 Obesity, unspecified: Secondary | ICD-10-CM | POA: Diagnosis present

## 2020-01-17 DIAGNOSIS — T380X5A Adverse effect of glucocorticoids and synthetic analogues, initial encounter: Secondary | ICD-10-CM | POA: Diagnosis not present

## 2020-01-17 DIAGNOSIS — Z8249 Family history of ischemic heart disease and other diseases of the circulatory system: Secondary | ICD-10-CM

## 2020-01-17 DIAGNOSIS — R7401 Elevation of levels of liver transaminase levels: Secondary | ICD-10-CM | POA: Diagnosis present

## 2020-01-17 DIAGNOSIS — Z833 Family history of diabetes mellitus: Secondary | ICD-10-CM

## 2020-01-17 DIAGNOSIS — Z7982 Long term (current) use of aspirin: Secondary | ICD-10-CM | POA: Diagnosis not present

## 2020-01-17 DIAGNOSIS — R739 Hyperglycemia, unspecified: Secondary | ICD-10-CM | POA: Diagnosis not present

## 2020-01-17 DIAGNOSIS — I10 Essential (primary) hypertension: Secondary | ICD-10-CM | POA: Diagnosis present

## 2020-01-17 DIAGNOSIS — R0602 Shortness of breath: Secondary | ICD-10-CM | POA: Diagnosis present

## 2020-01-17 DIAGNOSIS — E785 Hyperlipidemia, unspecified: Secondary | ICD-10-CM | POA: Diagnosis present

## 2020-01-17 DIAGNOSIS — Z882 Allergy status to sulfonamides status: Secondary | ICD-10-CM

## 2020-01-17 DIAGNOSIS — J1282 Pneumonia due to coronavirus disease 2019: Secondary | ICD-10-CM | POA: Diagnosis present

## 2020-01-17 DIAGNOSIS — U071 COVID-19: Principal | ICD-10-CM | POA: Diagnosis present

## 2020-01-17 DIAGNOSIS — E6609 Other obesity due to excess calories: Secondary | ICD-10-CM | POA: Diagnosis not present

## 2020-01-17 DIAGNOSIS — Z6831 Body mass index (BMI) 31.0-31.9, adult: Secondary | ICD-10-CM

## 2020-01-17 DIAGNOSIS — E782 Mixed hyperlipidemia: Secondary | ICD-10-CM | POA: Diagnosis not present

## 2020-01-17 DIAGNOSIS — Z6833 Body mass index (BMI) 33.0-33.9, adult: Secondary | ICD-10-CM | POA: Diagnosis not present

## 2020-01-17 DIAGNOSIS — E7800 Pure hypercholesterolemia, unspecified: Secondary | ICD-10-CM | POA: Diagnosis present

## 2020-01-17 LAB — C-REACTIVE PROTEIN: CRP: 21.6 mg/dL — ABNORMAL HIGH (ref <1.0)

## 2020-01-17 LAB — CBC
HCT: 43.6 % (ref 39.0–52.0)
Hemoglobin: 14.6 g/dL (ref 13.0–17.0)
MCH: 29.6 pg (ref 26.0–34.0)
MCHC: 33.5 g/dL (ref 30.0–36.0)
MCV: 88.3 fL (ref 80.0–100.0)
Platelets: 214 10*3/uL (ref 150–400)
RBC: 4.94 MIL/uL (ref 4.22–5.81)
RDW: 13.1 % (ref 11.5–15.5)
WBC: 7.7 10*3/uL (ref 4.0–10.5)
nRBC: 0 % (ref 0.0–0.2)

## 2020-01-17 LAB — COMPREHENSIVE METABOLIC PANEL
ALT: 62 U/L — ABNORMAL HIGH (ref 0–44)
AST: 83 U/L — ABNORMAL HIGH (ref 15–41)
Albumin: 3.3 g/dL — ABNORMAL LOW (ref 3.5–5.0)
Alkaline Phosphatase: 42 U/L (ref 38–126)
Anion gap: 16 — ABNORMAL HIGH (ref 5–15)
BUN: 21 mg/dL (ref 8–23)
CO2: 26 mmol/L (ref 22–32)
Calcium: 8.9 mg/dL (ref 8.9–10.3)
Chloride: 95 mmol/L — ABNORMAL LOW (ref 98–111)
Creatinine, Ser: 0.83 mg/dL (ref 0.61–1.24)
GFR calc Af Amer: >60 mL/min (ref >60)
GFR calc non Af Amer: >60 mL/min (ref >60)
Glucose, Bld: 117 mg/dL — ABNORMAL HIGH (ref 70–99)
Potassium: 3.7 mmol/L (ref 3.5–5.1)
Sodium: 137 mmol/L (ref 135–145)
Total Bilirubin: 0.9 mg/dL (ref 0.3–1.2)
Total Protein: 7.6 g/dL (ref 6.5–8.1)

## 2020-01-17 LAB — LACTIC ACID, PLASMA: Lactic Acid, Venous: 1.9 mmol/L (ref 0.5–1.9)

## 2020-01-17 LAB — PROCALCITONIN: Procalcitonin: <0.1 ng/mL

## 2020-01-17 LAB — D-DIMER, QUANTITATIVE: D-Dimer, Quant: 1.71 ug/mL-FEU — ABNORMAL HIGH (ref 0.00–0.50)

## 2020-01-17 LAB — LACTATE DEHYDROGENASE: LDH: 522 U/L — ABNORMAL HIGH (ref 98–192)

## 2020-01-17 LAB — FIBRINOGEN: Fibrinogen: >800 mg/dL — ABNORMAL HIGH (ref 210–475)

## 2020-01-17 LAB — TROPONIN I (HIGH SENSITIVITY): Troponin I (High Sensitivity): 6 ng/L (ref <18)

## 2020-01-17 LAB — TRIGLYCERIDES: Triglycerides: 204 mg/dL — ABNORMAL HIGH (ref <150)

## 2020-01-17 LAB — FERRITIN: Ferritin: 2349 ng/mL — ABNORMAL HIGH (ref 24–336)

## 2020-01-17 MED ORDER — ACETAMINOPHEN 325 MG PO TABS
650.0000 mg | ORAL_TABLET | Freq: Four times a day (QID) | ORAL | Status: DC | PRN
  Administered 2020-01-18: 650 mg via ORAL
  Filled 2020-01-17 (×2): qty 2

## 2020-01-17 MED ORDER — METHYLPREDNISOLONE SODIUM SUCC 125 MG IJ SOLR: 0.5000 mg/kg | Freq: Two times a day (BID) | INTRAMUSCULAR | Status: DC

## 2020-01-17 MED ORDER — SODIUM CHLORIDE 0.9 % IV SOLN
100.0000 mg | Freq: Every day | INTRAVENOUS | Status: AC
  Administered 2020-01-18 – 2020-01-21 (×4): 100 mg via INTRAVENOUS
  Filled 2020-01-17 (×4): qty 20

## 2020-01-17 MED ORDER — HYDROCOD POLST-CPM POLST ER 10-8 MG/5ML PO SUER
5.0000 mL | Freq: Two times a day (BID) | ORAL | Status: DC | PRN
  Administered 2020-01-18 – 2020-01-23 (×7): 5 mL via ORAL
  Filled 2020-01-17 (×7): qty 5

## 2020-01-17 MED ORDER — DEXAMETHASONE SODIUM PHOSPHATE 10 MG/ML IJ SOLN
10.0000 mg | Freq: Once | INTRAMUSCULAR | Status: AC
  Administered 2020-01-17: 10 mg via INTRAVENOUS
  Filled 2020-01-17: qty 1

## 2020-01-17 MED ORDER — ACETAMINOPHEN 650 MG RE SUPP: 650.0000 mg | Freq: Four times a day (QID) | RECTAL | Status: DC | PRN

## 2020-01-17 MED ORDER — IPRATROPIUM-ALBUTEROL 20-100 MCG/ACT IN AERS
2.0000 | INHALATION_SPRAY | Freq: Four times a day (QID) | RESPIRATORY_TRACT | Status: DC
  Administered 2020-01-18 – 2020-01-24 (×26): 2 via RESPIRATORY_TRACT
  Filled 2020-01-17: qty 4

## 2020-01-17 MED ORDER — ZINC SULFATE 220 (50 ZN) MG PO CAPS
220.0000 mg | ORAL_CAPSULE | Freq: Every day | ORAL | Status: DC
  Administered 2020-01-18 – 2020-01-24 (×7): 220 mg via ORAL
  Filled 2020-01-17 (×7): qty 1

## 2020-01-17 MED ORDER — ENOXAPARIN SODIUM 40 MG/0.4ML ~~LOC~~ SOLN
40.0000 mg | SUBCUTANEOUS | Status: DC
  Administered 2020-01-18: 40 mg via SUBCUTANEOUS
  Filled 2020-01-17: qty 0.4

## 2020-01-17 MED ORDER — SODIUM CHLORIDE 0.9 % IV SOLN
100.0000 mg | INTRAVENOUS | Status: AC
  Administered 2020-01-17 (×2): 100 mg via INTRAVENOUS
  Filled 2020-01-17 (×2): qty 20

## 2020-01-17 MED ORDER — GUAIFENESIN-DM 100-10 MG/5ML PO SYRP
10.0000 mL | ORAL_SOLUTION | ORAL | Status: DC | PRN
  Administered 2020-01-18 – 2020-01-22 (×3): 10 mL via ORAL
  Filled 2020-01-17 (×3): qty 10

## 2020-01-17 MED ORDER — ASCORBIC ACID 500 MG PO TABS
500.0000 mg | ORAL_TABLET | Freq: Every day | ORAL | Status: DC
  Administered 2020-01-18 – 2020-01-24 (×7): 500 mg via ORAL
  Filled 2020-01-17 (×7): qty 1

## 2020-01-17 NOTE — ED Provider Notes (Signed)
Emergency Department Provider Note   I have reviewed the triage vital signs and the nursing notes.   HISTORY  Chief Complaint Covid+   HPI Lawrence Henderson is a 64 y.o. male with past medical history of hypertension and high cholesterol presents to the emergency department with increased shortness of breath, cough, and flulike symptoms.  He was diagnosed with COVID-19 9 days ago and has the test results with him at bedside.  He began experiencing symptoms 10 days prior to ED presentation.  He has been on doxycycline and codeine cough syrup with progressively worsening symptoms especially over the past several days.  He is having some sharp pain in his right abdomen and chest which is mainly with coughing.  He has had some mild diarrhea but no nausea or vomiting.  He has not been vaccinated for COVID-19.    Past Medical History:  Diagnosis Date  . Hyperlipidemia   . Hypertension     Patient Active Problem List   Diagnosis Date Noted  . Acute respiratory disease due to COVID-19 virus 01/18/2020  . Pneumonia due to COVID-19 virus 01/17/2020  . Transaminitis 01/17/2020  . Hyperglycemia 01/17/2020  . Chest pain 01/28/2017  . Hypertension 01/28/2017  . Obesity 01/28/2017  . Hyperlipidemia     Past Surgical History:  Procedure Laterality Date  . APPENDECTOMY    . arm surgery     right arm 2006  . TONSILLECTOMY      Allergies Sulfa antibiotics  Family History  Problem Relation Age of Onset  . Lung cancer Mother   . Heart attack Father   . Diabetes Mellitus II Paternal Grandfather     Social History Social History   Tobacco Use  . Smoking status: Never Smoker  . Smokeless tobacco: Never Used  Substance Use Topics  . Alcohol use: Yes  . Drug use: No    Review of Systems  Constitutional: No fever/chills. Positive fatigue.  Eyes: No visual changes. ENT: No sore throat. Cardiovascular: Positive chest pain with coughing.  Respiratory: Positive shortness of  breath and frequent cough.  Gastrointestinal: No abdominal pain.  No nausea, no vomiting.  Mild diarrhea.  No constipation. Genitourinary: Negative for dysuria. Musculoskeletal: Negative for back pain. Skin: Negative for rash. Neurological: Negative for headaches, focal weakness or numbness.  10-point ROS otherwise negative.  ____________________________________________   PHYSICAL EXAM:  VITAL SIGNS: ED Triage Vitals [01/17/20 1931]  Enc Vitals Group     BP (!) 131/72     Pulse Rate 96     Resp (!) 27     Temp 98.8 F (37.1 C)     Temp Source Oral     SpO2 97 %     Weight (!) 235 lb (106.6 kg)     Height 5\' 11"  (1.803 m)   Constitutional: Alert and oriented. Patient with tachypnea and frequent coughing.  Eyes: Conjunctivae are normal.  Head: Atraumatic. Nose: No congestion/rhinnorhea. Mouth/Throat: Mucous membranes are moist.   Neck: No stridor.   Cardiovascular: Normal rate, regular rhythm. Good peripheral circulation. Grossly normal heart sounds.   Respiratory: Increased respiratory effort.  No retractions. Lungs with crackles bilaterally.  Gastrointestinal: Soft and nontender. No distention.  Musculoskeletal: No lower extremity tenderness nor edema. No gross deformities of extremities. Neurologic:  Normal speech and language. Skin:  Skin is warm, dry and intact. No rash noted.  ____________________________________________   LABS (all labs ordered are listed, but only abnormal results are displayed)  Labs Reviewed  COMPREHENSIVE METABOLIC  PANEL - Abnormal; Notable for the following components:      Result Value   Chloride 95 (*)    Glucose, Bld 117 (*)    Albumin 3.3 (*)    AST 83 (*)    ALT 62 (*)    Anion gap 16 (*)    All other components within normal limits  D-DIMER, QUANTITATIVE (NOT AT [facility]) - Abnormal; Notable for the following components:   D-Dimer, Quant 1.71 (*)    All other components within normal limits  LACTATE DEHYDROGENASE - Abnormal;  Notable for the following components:   LDH 522 (*)    All other components within normal limits  FERRITIN - Abnormal; Notable for the following components:   Ferritin 2,349 (*)    All other components within normal limits  TRIGLYCERIDES - Abnormal; Notable for the following components:   Triglycerides 204 (*)    All other components within normal limits  FIBRINOGEN - Abnormal; Notable for the following components:   Fibrinogen >800 (*)    All other components within normal limits  C-REACTIVE PROTEIN - Abnormal; Notable for the following components:   CRP 21.6 (*)    All other components within normal limits  CULTURE, BLOOD (ROUTINE X 2)  CULTURE, BLOOD (ROUTINE X 2)  CBC  LACTIC ACID, PLASMA  LACTIC ACID, PLASMA  PROCALCITONIN  HIV ANTIBODY (ROUTINE TESTING W REFLEX)  PHOSPHORUS  MAGNESIUM  FERRITIN  C-REACTIVE PROTEIN  COMPREHENSIVE METABOLIC PANEL  CBC WITH DIFFERENTIAL/PLATELET  D-DIMER, QUANTITATIVE (NOT AT [facility])  ABO/RH  TROPONIN I (HIGH SENSITIVITY)  TROPONIN I (HIGH SENSITIVITY)   ____________________________________________  EKG   EKG Interpretation  Date/Time:  Thursday January 17 2020 19:33:20 EDT Ventricular Rate:  98 PR Interval:    QRS Duration: 82 QT Interval:  351 QTC Calculation: 449 R Axis:     Text Interpretation: Sinus rhythm Probable left atrial enlargement Borderline T abnormalities, diffuse leads 12 Lead; Mason-Likar No STEMI Confirmed by Nanda Quinton [phone]) on 01/17/2020 7:59:05 PM       ____________________________________________  RADIOLOGY  DG Chest Port 1 View  Result Date: 01/17/2020 CLINICAL DATA:  COVID positive cough EXAM: PORTABLE CHEST 1 VIEW COMPARISON:  01/28/2017 FINDINGS: Patchy bilateral largely peripheral and left basilar consolidations and ground-glass densities. No pleural effusion. Borderline cardiomegaly. No pneumothorax. IMPRESSION: Patchy bilateral peripheral and left basilar consolidations and ground-glass densities  consistent with bilateral pneumonia and history of COVID positivity. Electronically Signed   By: Donavan Foil M.D.   On: 01/17/2020 20:20    ____________________________________________   PROCEDURES  Procedure(s) performed:   Procedures  CRITICAL CARE Performed by: Margette Fast Total critical care time: 35 minutes Critical care time was exclusive of separately billable procedures and treating other patients. Critical care was necessary to treat or prevent imminent or life-threatening deterioration. Critical care was time spent personally by me on the following activities: development of treatment plan with patient and/or surrogate as well as nursing, discussions with consultants, evaluation of patient's response to treatment, examination of patient, obtaining history from patient or surrogate, ordering and performing treatments and interventions, ordering and review of laboratory studies, ordering and review of radiographic studies, pulse oximetry and re-evaluation of patient's condition.  Nanda Quinton, MD Emergency Medicine  ____________________________________________   INITIAL IMPRESSION / ASSESSMENT AND PLAN / ED COURSE  Pertinent labs & imaging results that were available during my care of the patient were reviewed by me and considered in my medical decision making (see chart for details).  Patient presents to the emergency department with worsening symptoms related to COVID-19.  He is having frequent cough and increased work of breathing on my initial exam.  His oxygen saturation on arrival to the room is 91% at rest.  Will ambulate patient but plan to start 2 L nasal cannula oxygen given increased work of breathing.  His chest x-ray shows bilateral infiltrates consistent with COVID-19 infection.  He is having sharp chest pain but it is reproducible on palpation and with coughing and suspect primarily musculoskeletal etiology.  Will follow admit labs and feel the patient would  benefit from admission for Decadron and remdesivir.   Discussed patient's case with [facility] to request admission. Patient and family (if present) updated with plan. Care transferred to [facility] service.  I reviewed all nursing notes, vitals, pertinent old records, EKGs, labs, imaging (as available).   JONTAE ADEBAYO was evaluated in Emergency Department on 01/18/2020 for the symptoms described in the history of present illness. He was evaluated in the context of the global COVID-19 pandemic, which necessitated consideration that the patient might be at risk for infection with the SARS-CoV-2 virus that causes COVID-19. Institutional protocols and algorithms that pertain to the evaluation of patients at risk for COVID-19 are in a state of rapid change based on information released by regulatory bodies including the CDC and federal and state organizations. These policies and algorithms were followed during the patient's care in the ED. ____________________________________________  FINAL CLINICAL IMPRESSION(S) / ED DIAGNOSES  Final diagnoses:  COVID-19  SOB (shortness of breath)     MEDICATIONS GIVEN DURING THIS VISIT:  Medications  remdesivir 100 mg in sodium chloride 0.9 % 100 mL IVPB (0 mg Intravenous Stopped 01/18/20 0853)  enoxaparin (LOVENOX) injection 40 mg (40 mg Subcutaneous Given 01/18/20 0824)  acetaminophen (TYLENOL) tablet 650 mg (has no administration in time range)    Or  acetaminophen (TYLENOL) suppository 650 mg (has no administration in time range)  guaiFENesin-dextromethorphan (ROBITUSSIN DM) 100-10 MG/5ML syrup 10 mL (has no administration in time range)  chlorpheniramine-HYDROcodone (TUSSIONEX) 10-8 MG/5ML suspension 5 mL (has no administration in time range)  ascorbic acid (VITAMIN C) tablet 500 mg (500 mg Oral Given 01/18/20 0819)  zinc sulfate capsule 220 mg (220 mg Oral Given 01/18/20 0819)  Ipratropium-Albuterol (COMBIVENT) respimat 2 puff (2 puffs Inhalation Given 01/18/20  0810)  lisinopril (ZESTRIL) tablet 20 mg (20 mg Oral Given 01/18/20 0818)  aspirin EC tablet 81 mg (81 mg Oral Given 01/18/20 0819)  methylPREDNISolone sodium succinate (SOLU-MEDROL) 125 mg/2 mL injection 100 mg (100 mg Intravenous Given 01/18/20 0812)  dexamethasone (DECADRON) injection 10 mg (10 mg Intravenous Given 01/17/20 2141)  remdesivir 100 mg in sodium chloride 0.9 % 100 mL IVPB (0 mg Intravenous Stopped 01/18/20 0000)     Note:  This document was prepared using Dragon voice recognition software and may include unintentional dictation errors.  Nanda Quinton, MD, [facility] Emergency Medicine    Keyontae Huckeby, [facility], MD 01/18/20 1346

## 2020-01-17 NOTE — H&P (Signed)
History and Physical    [name] NID:[id number] DOB: 08/15/1955 DOA: 01/17/2020  PCP: Mayra Neer, MD   Patient coming from: Home.  I have personally briefly reviewed patient's old medical records in [place]  Chief Complaint: Shortness of breath.  HPI: Lawrence Henderson is a 64 y.o. male with medical history significant of hyperlipidemia, hypertension who is coming to the emergency department with complaints of progressively worse dyspnea associated with dry cough, fever, chills, fatigue, malaise, decreased appetite and loose stools.  He was exposed to his son that had Covid.  He tried doxycycline and codeine syrup without any significant relief.  He mentions that he has been coughing so much that his right sided abdomen hurts when coughing.  He denies headache, wheezing, hemoptysis, chest pain, palpitations, PND, orthopnea or lower extremity edema.  No nausea, vomiting, constipation, melena or hematochezia.  Denies dysuria, frequency or hematuria.  No polyphagia, polydipsia, polyuria or blurred vision.  ED Course: Initial vital signs were temperature 98.8 F, pulse 96, respirations 27, blood pressure 131/72 mmHg and O2 sat 97% on room air.  Patient received 10 mg of dexamethasone and remdesivir in the emergency department.  CBC was normal with a white count 7.7, hemoglobin 14.6 g/dL and platelets 214.  D-dimer was 1.71 and fibrinogen more than 800.  CMP showed a chloride of 95 mmol/L anion gap was 16, glucose of 117 mg/dL.  Renal function was normal.  Albumin was 3.3 g/dL, AST 83 and ALT 62 units/L.  The rest of the hepatic functions are within normal range.  Procalcitonin was normal.  LDH 522, triglycerides 204, CRP 21.6 and ferritin 2349.  Lactic acid was 1.9 and then 1.3 mmol/L.  Imaging: His chest radiograph show patchy bilateral peripheral and left basilar consolidations and groundglass density consistent with bilateral pneumonia and history of Covid positivity.  Please  see image and full radiology report for further detail.  Review of Systems: As per HPI otherwise all other systems reviewed and are negative.  Past Medical History:  Diagnosis Date  . Hyperlipidemia   . Hypertension     Past Surgical History:  Procedure Laterality Date  . APPENDECTOMY    . arm surgery     right arm 2006  . TONSILLECTOMY      Social History  reports that he has never smoked. He has never used smokeless tobacco. He reports current alcohol use. He reports that he does not use drugs.  Allergies  Allergen Reactions  . Sulfa Antibiotics Anaphylaxis and Swelling    64 years old, Hospitalized for 2 weeks, unknown   Family medical history Mother lung cancer. Father CAD/MI  Prior to Admission medications   Medication Sig Start Date End Date Taking? Authorizing Provider  aspirin EC 81 MG tablet Take 81 mg by mouth daily.   Yes [provider]  co-enzyme Q-10 30 MG capsule Take 30 mg by mouth daily.    Yes [provider]  lisinopril (PRINIVIL,ZESTRIL) 20 MG tablet Take 20 mg by mouth daily. 01/13/15  Yes [provider]  Multiple Vitamin (MULTIVITAMIN WITH MINERALS) TABS tablet Take 1 tablet by mouth daily.   Yes [provider]  rosuvastatin (CRESTOR) 20 MG tablet Take 20 mg by mouth daily.   Yes [provider]  vitamin C (ASCORBIC ACID) 500 MG tablet Take 500 mg by mouth daily.   Yes [provider]  diphenhydrAMINE (BENADRYL) 25 MG tablet Take 1 tablet (25 mg total) by mouth every 6 (six)  hours. Patient not taking: Reported on 01/17/2020 07/09/18   Domenic Moras, PA-C  predniSONE (DELTASONE) 20 MG tablet 3 tabs po day one, then 2 tabs daily x 4 days Patient not taking: Reported on 01/17/2020 07/09/18   Domenic Moras, PA-C    Physical Exam: Vitals:   01/17/20 2047 01/17/20 2133 01/17/20 2210 01/17/20 2336  BP: (!) 142/78 (!) 146/77 (!) 149/87 (!) 139/77  Pulse: 101 104 100 95  Resp: (!) 27 (!) 33 (!) 34 (!) 30    Temp:      TempSrc:      SpO2: 92% 90% 91% 91%  Weight:      Height:        Constitutional: Looks acutely ill. Eyes: PERRL, lids and conjunctivae injected ENMT: Mucous membranes are moist. Posterior pharynx clear of any exudate or lesions. Neck: normal, supple, no masses, no thyromegaly Respiratory: Tachypneic in the mid 20s.  Decreased breath sound with bibasilar crackles and mild rhonchi, no wheezing.  No accessory muscle use.  Cardiovascular: Regular rate and rhythm, no murmurs / rubs / gallops. No extremity edema. 2+ pedal pulses. No carotid bruits.  Abdomen: Obese, nondistended.  BS positive.  Soft, no tenderness, no masses palpated. No hepatosplenomegaly.  Musculoskeletal: no clubbing / cyanosis. Good ROM, no contractures. Normal muscle tone.  Skin: no rashes, lesions, ulcers on limited dermatological examination. Neurologic: CN 2-12 grossly intact. Sensation intact, DTR normal. Strength 5/5 in all 4.  Psychiatric: Normal judgment and insight. Alert and oriented x 3. Normal mood.   Labs on Admission: I have personally reviewed following labs and imaging studies  CBC: Recent Labs  Lab 01/17/20 2119  WBC 7.7  HGB 14.6  HCT 43.6  MCV 88.3  PLT 128    Basic Metabolic Panel: Recent Labs  Lab 01/17/20 2120  NA 137  K 3.7  CL 95*  CO2 26  GLUCOSE 117*  BUN 21  CREATININE 0.83  CALCIUM 8.9    GFR: Estimated Creatinine Clearance: 113.1 mL/min (by C-G formula based on SCr of 0.83 mg/dL).  Liver Function Tests: Recent Labs  Lab 01/17/20 2120  AST 83*  ALT 62*  ALKPHOS 42  BILITOT 0.9  PROT 7.6  ALBUMIN 3.3*   Radiological Exams on Admission: DG Chest Port 1 View  Result Date: 01/17/2020 CLINICAL DATA:  COVID positive cough EXAM: PORTABLE CHEST 1 VIEW COMPARISON:  01/28/2017 FINDINGS: Patchy bilateral largely peripheral and left basilar consolidations and ground-glass densities. No pleural effusion. Borderline cardiomegaly. No pneumothorax. IMPRESSION:  Patchy bilateral peripheral and left basilar consolidations and ground-glass densities consistent with bilateral pneumonia and history of COVID positivity. Electronically Signed   By: Donavan Foil M.D.   On: 01/17/2020 20:20   EKG: Independently reviewed.  Vent. rate 98 BPM PR interval * ms QRS duration 82 ms QT/QTc 351/449 ms P-R-T axes 19 -1 -46 Sinus rhythm Probable left atrial enlargement Borderline T abnormalities, diffuse leads  Assessment/Plan Principal Problem:   Pneumonia due to COVID-19 virus Admit to stepdown/inpatient. Continue supplemental oxygen. Continue bronchodilators. Antitussives as needed. Acetaminophen as needed. Remdesivir per pharmacy. Solu-Medrol 0.5 mg/kg every 12 hours. Normal procalcitonin. Consider Actemra if oxygenation worsens.  Active Problems:   Hypertension Continue lisinopril 20 mg p.o. daily.    Hyperlipidemia Hold Crestor due to transaminitis.    Obesity Risk factor for severe Covid 19 disease. Lifestyle modifications after discharge.    Transaminitis Follow AST/ALT. Hold rosuvastatin.    Hyperglycemia Monitor closely while on glucocorticoids.   DVT prophylaxis: Lovenox SQ. Code  Status:   Full code. Family Communication: Disposition Plan:   Patient is from:  Home.  Anticipated DC to:  Home.  Anticipated DC date:  TBD.  Anticipated DC barriers: Clinical status.  Consults called: Admission status:  Inpatient/stepdown.    Severity of Illness: High due to COVID-19 pneumonia with oxygen requirement.  Reubin Milan MD [facility]  How to contact the [facility] Attending or Consulting provider [place] or covering provider during after hours [place], for this patient?   1. Check the care team in [facility] and look for a) attending/consulting TR[facility] provider listed and b) the [facility] team listed 2. Log into [email or web address] and use [place]'s universal password to access. If you do not have the password, please contact the hospital  operator. 3. Locate the [facility] provider you are looking for under [facility] and page to a number that you can be directly reached. 4. If you still have difficulty reaching the provider, please page the [facility] (Director on Call) for the Hospitalists listed on amion for assistance.  01/17/2020, 11:41 PM   This document was prepared using Dragon voice recognition software and may contain some unintended transcription errors.

## 2020-01-17 NOTE — ED Triage Notes (Signed)
Patient reports covid+ x10 days. C/o persistent cough and pulled muscle in abdomen from coughing. Also reports stabbing chest pain with cough.

## 2020-01-18 ENCOUNTER — Encounter (HOSPITAL_COMMUNITY): Payer: Self-pay | Admitting: Internal Medicine

## 2020-01-18 DIAGNOSIS — I10 Essential (primary) hypertension: Secondary | ICD-10-CM

## 2020-01-18 DIAGNOSIS — R7401 Elevation of levels of liver transaminase levels: Secondary | ICD-10-CM

## 2020-01-18 DIAGNOSIS — R739 Hyperglycemia, unspecified: Secondary | ICD-10-CM

## 2020-01-18 DIAGNOSIS — Z6833 Body mass index (BMI) 33.0-33.9, adult: Secondary | ICD-10-CM

## 2020-01-18 DIAGNOSIS — U071 COVID-19: Secondary | ICD-10-CM | POA: Diagnosis present

## 2020-01-18 DIAGNOSIS — J069 Acute upper respiratory infection, unspecified: Secondary | ICD-10-CM

## 2020-01-18 DIAGNOSIS — E6609 Other obesity due to excess calories: Secondary | ICD-10-CM

## 2020-01-18 DIAGNOSIS — E782 Mixed hyperlipidemia: Secondary | ICD-10-CM

## 2020-01-18 LAB — CBC WITH DIFFERENTIAL/PLATELET
Abs Immature Granulocytes: 0.06 10*3/uL (ref 0.00–0.07)
Basophils Absolute: 0 10*3/uL (ref 0.0–0.1)
Basophils Relative: 0 %
Eosinophils Absolute: 0 10*3/uL (ref 0.0–0.5)
Eosinophils Relative: 0 %
HCT: 40.9 % (ref 39.0–52.0)
Hemoglobin: 13.9 g/dL (ref 13.0–17.0)
Immature Granulocytes: 1 %
Lymphocytes Relative: 18 %
Lymphs Abs: 0.9 10*3/uL (ref 0.7–4.0)
MCH: 29.9 pg (ref 26.0–34.0)
MCHC: 34 g/dL (ref 30.0–36.0)
MCV: 88 fL (ref 80.0–100.0)
Monocytes Absolute: 0.1 10*3/uL (ref 0.1–1.0)
Monocytes Relative: 3 %
Neutro Abs: 3.8 10*3/uL (ref 1.7–7.7)
Neutrophils Relative %: 78 %
Platelets: 228 10*3/uL (ref 150–400)
RBC: 4.65 MIL/uL (ref 4.22–5.81)
RDW: 13.2 % (ref 11.5–15.5)
WBC: 4.9 10*3/uL (ref 4.0–10.5)
nRBC: 0 % (ref 0.0–0.2)

## 2020-01-18 LAB — COMPREHENSIVE METABOLIC PANEL
ALT: 114 U/L — ABNORMAL HIGH (ref 0–44)
AST: 125 U/L — ABNORMAL HIGH (ref 15–41)
Albumin: 3 g/dL — ABNORMAL LOW (ref 3.5–5.0)
Alkaline Phosphatase: 40 U/L (ref 38–126)
Anion gap: 14 (ref 5–15)
BUN: 25 mg/dL — ABNORMAL HIGH (ref 8–23)
CO2: 27 mmol/L (ref 22–32)
Calcium: 8.7 mg/dL — ABNORMAL LOW (ref 8.9–10.3)
Chloride: 99 mmol/L (ref 98–111)
Creatinine, Ser: 0.7 mg/dL (ref 0.61–1.24)
GFR calc Af Amer: >60 mL/min (ref >60)
GFR calc non Af Amer: >60 mL/min (ref >60)
Glucose, Bld: 202 mg/dL — ABNORMAL HIGH (ref 70–99)
Potassium: 3.7 mmol/L (ref 3.5–5.1)
Sodium: 140 mmol/L (ref 135–145)
Total Bilirubin: 0.7 mg/dL (ref 0.3–1.2)
Total Protein: 7.5 g/dL (ref 6.5–8.1)

## 2020-01-18 LAB — LACTIC ACID, PLASMA: Lactic Acid, Venous: 1.3 mmol/L (ref 0.5–1.9)

## 2020-01-18 LAB — GLUCOSE, CAPILLARY: Glucose-Capillary: 226 mg/dL — ABNORMAL HIGH (ref 70–99)

## 2020-01-18 LAB — ABO/RH: ABO/RH(D): O POS

## 2020-01-18 LAB — HIV ANTIBODY (ROUTINE TESTING W REFLEX): HIV Screen 4th Generation wRfx: NONREACTIVE

## 2020-01-18 LAB — D-DIMER, QUANTITATIVE: D-Dimer, Quant: 1.75 ug/mL-FEU — ABNORMAL HIGH (ref 0.00–0.50)

## 2020-01-18 LAB — FERRITIN: Ferritin: 2634 ng/mL — ABNORMAL HIGH (ref 24–336)

## 2020-01-18 LAB — TROPONIN I (HIGH SENSITIVITY): Troponin I (High Sensitivity): 6 ng/L (ref <18)

## 2020-01-18 LAB — MAGNESIUM: Magnesium: 3.1 mg/dL — ABNORMAL HIGH (ref 1.7–2.4)

## 2020-01-18 LAB — PHOSPHORUS: Phosphorus: 3.8 mg/dL (ref 2.5–4.6)

## 2020-01-18 LAB — C-REACTIVE PROTEIN: CRP: 17.5 mg/dL — ABNORMAL HIGH (ref <1.0)

## 2020-01-18 MED ORDER — COENZYME Q10 30 MG PO CAPS: 30.0000 mg | ORAL_CAPSULE | Freq: Every day | ORAL | Status: DC

## 2020-01-18 MED ORDER — ENOXAPARIN SODIUM 40 MG/0.4ML ~~LOC~~ SOLN
40.0000 mg | Freq: Two times a day (BID) | SUBCUTANEOUS | Status: DC
  Administered 2020-01-18 – 2020-01-20 (×4): 40 mg via SUBCUTANEOUS
  Filled 2020-01-18 (×4): qty 0.4

## 2020-01-18 MED ORDER — LISINOPRIL 20 MG PO TABS
20.0000 mg | ORAL_TABLET | Freq: Every day | ORAL | Status: DC
  Administered 2020-01-18 – 2020-01-24 (×7): 20 mg via ORAL
  Filled 2020-01-18 (×7): qty 1

## 2020-01-18 MED ORDER — TRAMADOL HCL 50 MG PO TABS: 50.0000 mg | ORAL_TABLET | Freq: Four times a day (QID) | ORAL | Status: DC | PRN

## 2020-01-18 MED ORDER — INSULIN ASPART 100 UNIT/ML ~~LOC~~ SOLN
0.0000 [IU] | Freq: Every day | SUBCUTANEOUS | Status: DC
  Administered 2020-01-18: 2 [IU] via SUBCUTANEOUS

## 2020-01-18 MED ORDER — INSULIN ASPART 100 UNIT/ML ~~LOC~~ SOLN
0.0000 [IU] | Freq: Three times a day (TID) | SUBCUTANEOUS | Status: DC
  Administered 2020-01-19: 3 [IU] via SUBCUTANEOUS
  Administered 2020-01-19: 5 [IU] via SUBCUTANEOUS
  Administered 2020-01-19: 3 [IU] via SUBCUTANEOUS
  Administered 2020-01-20: 2 [IU] via SUBCUTANEOUS
  Administered 2020-01-20 (×2): 3 [IU] via SUBCUTANEOUS
  Administered 2020-01-21 (×2): 2 [IU] via SUBCUTANEOUS
  Administered 2020-01-21: 3 [IU] via SUBCUTANEOUS
  Administered 2020-01-22: 8 [IU] via SUBCUTANEOUS
  Administered 2020-01-22 – 2020-01-24 (×3): 3 [IU] via SUBCUTANEOUS

## 2020-01-18 MED ORDER — ASPIRIN EC 81 MG PO TBEC
81.0000 mg | DELAYED_RELEASE_TABLET | Freq: Every day | ORAL | Status: DC
  Administered 2020-01-18 – 2020-01-24 (×7): 81 mg via ORAL
  Filled 2020-01-18 (×7): qty 1

## 2020-01-18 MED ORDER — METHYLPREDNISOLONE SODIUM SUCC 125 MG IJ SOLR
100.0000 mg | Freq: Two times a day (BID) | INTRAMUSCULAR | Status: DC
  Administered 2020-01-18 – 2020-01-20 (×6): 100 mg via INTRAVENOUS
  Filled 2020-01-18 (×6): qty 2

## 2020-01-18 NOTE — Progress Notes (Signed)
PHARMACIST - PHYSICIAN ORDER COMMUNICATION  CONCERNING: P&T Medication Policy on Herbal Medications  DESCRIPTION:  This patient's order for:  Co-Q 10  has been noted.  This product(s) is classified as an "herbal" or natural product. Due to a lack of definitive safety studies or FDA approval, nonstandard manufacturing practices, plus the potential risk of unknown drug-drug interactions while on inpatient medications, the Pharmacy and Therapeutics Committee does not permit the use of "herbal" or natural products of this type within [facility].   ACTION TAKEN: The pharmacy department is unable to verify this order at this time. Please reevaluate patient's clinical condition at discharge and address if the herbal or natural product(s) should be resumed at that time.  Lenis Noon, PharmD 01/18/20 6:28 AM

## 2020-01-18 NOTE — ED Notes (Addendum)
Pt offers no complaints, RR 20-28, but breathing even and unlabored. Denies any CP.

## 2020-01-18 NOTE — ED Notes (Signed)
Report to floor

## 2020-01-18 NOTE — Progress Notes (Signed)
PROGRESS NOTE  Lawrence Henderson  HQP:[id number] DOB: 1955-09-08 DOA: 01/17/2020 PCP: Mayra Neer, MD  Brief Narrative: Lawrence Henderson is an unvaccinated (for covid) 64 y.o. male with a history of HTN, HLD, and positive covid-19 testing 9 days PTA who presented to the ED 7/22 with progressive cough, fever, dyspnea, and fatigue after exposure to his son who had covid-19. In the ED he was afebrile, tachypneic, but not hypoxemic. WBC 7.7k, CRP 21.6, AST 83, ALT 62, d-dimer 1.71. CXR showed bilateral patchy peripheral opacities typical of covid pneumonia.    Assessment & Plan: Principal Problem:   Pneumonia due to COVID-19 virus Active Problems:   Hypertension   Hyperlipidemia   Obesity   Transaminitis   Hyperglycemia   Acute respiratory disease due to COVID-19 virus  Acute hypoxemic respiratory failure due to covid-19 pneumonia: SARS-CoV-2 PCR positive 7/13. CXR and CRP elevation are very concerning in patient w/HTN, obesity, HLD, though level of hypoxemia is mild at this time.  - VERY low threshold to give tocilizumab if hypoxemia worsens. The risks and benefits, inclusion and exclusion criteria were discussed with the patient at length this morning. He would consent to a dose if oxygen needs increase over this next 24 hours. Monitoring LFTs closely.  - Continue remdesivir x5 days  - Steroids started, will augment dosing - Vitamin C, zinc - Encourage OOB, IS, FV, and awake proning if able - Tylenol and antitussives prn - Continue airborne, contact precautions for 21 days from positive testing. - Trend inflammatory markers, showing some improvement.  - Enoxaparin given this AM. Will continue 40mg  (less than 0.5mg /kg) q12h due to severity of inflammatory state. Hgb wnl. In this current condition risk of life-threatening bleed is less likely than risk of life-threatening pulmonary embolism.  LFT elevation: Likely due to covid itself.  - Continue monitoring. Must stop remdesivir if ALT  10x ULN.  - Hold statin  HTN:  - Continue lisinopril   HLD:  - Hold statin as above  Steroid-induced hyperglycemia:  - Check HbA1c - SSI to optimize control.  DVT prophylaxis: Lovenox 40mg  q12h Code Status: Full Family Communication: None at bedside. Offered to call, declined. Disposition Plan:  Status is: Inpatient  Remains inpatient appropriate because:Inpatient level of care appropriate due to severity of illness   Dispo: The patient is from: Home              Anticipated d/c is to: Home              Anticipated d/c date is: > 3 days              Patient currently is not medically stable to d/c.  Consultants:   None  Procedures:   None  Antimicrobials:  Remdesivir   Subjective: Has never felt this bad before in his life. Tired, weak, short of breath at rest. Right abdominal sharp pain with cough. Cough is persistent and severe.   Objective: Vitals:   01/18/20 1136 01/18/20 1217 01/18/20 1253 01/18/20 1359  BP: 124/71 (!) 135/72 (!) 135/74 (!) 139/78  Pulse: 83 88 76 80  Resp: 20 (!) 24 19 23   Temp:    98.6 F (37 C)  TempSrc:    Oral  SpO2: 94% 90% 93% 91%  Weight:    (!) 102.2 kg  Height:    5\' 11"  (1.803 m)    Intake/Output Summary (Last 24 hours) at 01/18/2020 1439 Last data filed at 01/18/2020 0853 Gross per 24 hour  Intake  100 ml  Output --  Net 100 ml   Filed Weights   01/17/20 1931 01/18/20 1359  Weight: (!) 106.6 kg (!) 102.2 kg    Gen: 64 y.o. male in no distress Pulm: Non-labored tachypnea with supplemental oxygen. Crackles bilaterally.  CV: Regular rate and rhythm. No murmur, rub, or gallop. No JVD, no pedal edema. GI: Abdomen soft, non-tender, non-distended, with normoactive bowel sounds. No organomegaly or masses felt. Ext: Warm, no deformities Skin: No rashes, lesions or ulcers Neuro: Alert and oriented. No focal neurological deficits. Psych: Judgement and insight appear normal. Mood & affect appropriate.   Data Reviewed: I  have personally reviewed following labs and imaging studies  CBC: Recent Labs  Lab 01/17/20 2119 01/18/20 1416  WBC 7.7 4.9  NEUTROABS  --  PENDING  HGB 14.6 13.9  HCT 43.6 40.9  MCV 88.3 88.0  PLT 214 545   Basic Metabolic Panel: Recent Labs  Lab 01/17/20 2120  NA 137  K 3.7  CL 95*  CO2 26  GLUCOSE 117*  BUN 21  CREATININE 0.83  CALCIUM 8.9   GFR: Estimated Creatinine Clearance: 110.9 mL/min (by C-G formula based on SCr of 0.83 mg/dL). Liver Function Tests: Recent Labs  Lab 01/17/20 2120  AST 83*  ALT 62*  ALKPHOS 42  BILITOT 0.9  PROT 7.6  ALBUMIN 3.3*   No results for input(s): LIPASE, AMYLASE in the last 168 hours. No results for input(s): AMMONIA in the last 168 hours. Coagulation Profile: No results for input(s): INR, PROTIME in the last 168 hours. Cardiac Enzymes: No results for input(s): CKTOTAL, CKMB, CKMBINDEX, TROPONINI in the last 168 hours. BNP (last 3 results) No results for input(s): PROBNP in the last 8760 hours. HbA1C: No results for input(s): HGBA1C in the last 72 hours. CBG: No results for input(s): GLUCAP in the last 168 hours. Lipid Profile: Recent Labs    01/17/20 2120  TRIG 204*   Thyroid Function Tests: No results for input(s): TSH, T4TOTAL, FREET4, T3FREE, THYROIDAB in the last 72 hours. Anemia Panel: Recent Labs    01/17/20 2120  FERRITIN 2,349*   Urine analysis:    Component Value Date/Time   COLORURINE AMBER (A) 12/15/2016 1342   APPEARANCEUR HAZY (A) 12/15/2016 1342   LABSPEC 1.027 12/15/2016 1342   PHURINE 6.0 12/15/2016 1342   GLUCOSEU NEGATIVE 12/15/2016 1342   HGBUR NEGATIVE 12/15/2016 1342   BILIRUBINUR NEGATIVE 12/15/2016 1342   KETONESUR NEGATIVE 12/15/2016 1342   PROTEINUR 30 (A) 12/15/2016 1342   UROBILINOGEN 0.2 06/14/2007 1305   NITRITE NEGATIVE 12/15/2016 1342   LEUKOCYTESUR NEGATIVE 12/15/2016 1342   Recent Results (from the past 240 hour(s))  Blood Culture (routine x 2)     Status: None  (Preliminary result)   Collection Time: 01/17/20  9:19 PM   Specimen: BLOOD RIGHT FOREARM  Result Value Ref Range Status   Specimen Description   Final    BLOOD RIGHT FOREARM Performed at [facility], [place] [address] [place], [place] [address]    Special Requests   Final    BOTTLES DRAWN AEROBIC AND ANAEROBIC Blood Culture results may not be optimal due to an inadequate volume of blood received in culture bottles Performed at [facility] [address], [place], [place] [address]    Culture   Final    NO GROWTH < 12 HOURS Performed at [facility] [address], [place], [place] [address]    Report Status PENDING  Incomplete  Blood Culture (routine x 2)     Status: None (Preliminary result)   Collection Time: 01/17/20  9:19 PM   Specimen: BLOOD  Result Value Ref Range Status   Specimen Description   Final    BLOOD LEFT ANTECUBITAL Performed at [facility] [address], [place], [place] [address]    Special Requests   Final    BOTTLES DRAWN AEROBIC AND ANAEROBIC Blood Culture adequate volume Performed at [facility] [address] [place], [place] [address]    Culture   Final    NO GROWTH < 12 HOURS Performed at [facility] [address] [place], [place] [address]    Report Status PENDING  Incomplete      Radiology Studies: DG Chest Port 1 View  Result Date: 01/17/2020 CLINICAL DATA:  COVID positive cough EXAM: PORTABLE CHEST 1 VIEW COMPARISON:  01/28/2017 FINDINGS: Patchy bilateral largely peripheral and left basilar consolidations and ground-glass densities. No pleural effusion. Borderline cardiomegaly. No pneumothorax. IMPRESSION: Patchy bilateral peripheral and left basilar consolidations and ground-glass densities consistent with bilateral pneumonia and history of COVID positivity. Electronically Signed   By: Donavan Foil M.D.   On: 01/17/2020 20:20    Scheduled Meds:  vitamin C   500 mg Oral Daily   aspirin EC  81 mg Oral Daily   enoxaparin (LOVENOX) injection  40 mg Subcutaneous Q24H   Ipratropium-Albuterol  2 puff Inhalation Q6H   lisinopril  20 mg Oral Daily   methylPREDNISolone (SOLU-MEDROL) injection  100 mg Intravenous Q12H   zinc sulfate  220 mg Oral Daily   Continuous Infusions:  remdesivir 100 mg in NS 100 mL Stopped (01/18/20 0853)     LOS: 1 day   Time spent: 35 minutes.  Patrecia Pour, MD [facility] [email or web address] 01/18/2020, 2:39 PM

## 2020-01-18 NOTE — ED Notes (Signed)
Assumed care of pt at this time. Pt resting in stretcher, no s/sx of acute distress at this time.  

## 2020-01-19 DIAGNOSIS — E782 Mixed hyperlipidemia: Secondary | ICD-10-CM | POA: Diagnosis not present

## 2020-01-19 DIAGNOSIS — I10 Essential (primary) hypertension: Secondary | ICD-10-CM | POA: Diagnosis not present

## 2020-01-19 DIAGNOSIS — U071 COVID-19: Secondary | ICD-10-CM | POA: Diagnosis not present

## 2020-01-19 DIAGNOSIS — R739 Hyperglycemia, unspecified: Secondary | ICD-10-CM | POA: Diagnosis not present

## 2020-01-19 LAB — CBC WITH DIFFERENTIAL/PLATELET
Abs Immature Granulocytes: 0.12 10*3/uL — ABNORMAL HIGH (ref 0.00–0.07)
Basophils Absolute: 0 10*3/uL (ref 0.0–0.1)
Basophils Relative: 0 %
Eosinophils Absolute: 0 10*3/uL (ref 0.0–0.5)
Eosinophils Relative: 0 %
HCT: 43.2 % (ref 39.0–52.0)
Hemoglobin: 14 g/dL (ref 13.0–17.0)
Immature Granulocytes: 1 %
Lymphocytes Relative: 18 %
Lymphs Abs: 1.7 10*3/uL (ref 0.7–4.0)
MCH: 29.5 pg (ref 26.0–34.0)
MCHC: 32.4 g/dL (ref 30.0–36.0)
MCV: 90.9 fL (ref 80.0–100.0)
Monocytes Absolute: 0.4 10*3/uL (ref 0.1–1.0)
Monocytes Relative: 5 %
Neutro Abs: 6.8 10*3/uL (ref 1.7–7.7)
Neutrophils Relative %: 76 %
Platelets: 279 10*3/uL (ref 150–400)
RBC: 4.75 MIL/uL (ref 4.22–5.81)
RDW: 13.2 % (ref 11.5–15.5)
WBC: 9.1 10*3/uL (ref 4.0–10.5)
nRBC: 0 % (ref 0.0–0.2)

## 2020-01-19 LAB — COMPREHENSIVE METABOLIC PANEL
ALT: 129 U/L — ABNORMAL HIGH (ref 0–44)
AST: 99 U/L — ABNORMAL HIGH (ref 15–41)
Albumin: 3.2 g/dL — ABNORMAL LOW (ref 3.5–5.0)
Alkaline Phosphatase: 39 U/L (ref 38–126)
Anion gap: 15 (ref 5–15)
BUN: 27 mg/dL — ABNORMAL HIGH (ref 8–23)
CO2: 26 mmol/L (ref 22–32)
Calcium: 9.1 mg/dL (ref 8.9–10.3)
Chloride: 100 mmol/L (ref 98–111)
Creatinine, Ser: 0.83 mg/dL (ref 0.61–1.24)
GFR calc Af Amer: >60 mL/min (ref >60)
GFR calc non Af Amer: >60 mL/min (ref >60)
Glucose, Bld: 172 mg/dL — ABNORMAL HIGH (ref 70–99)
Potassium: 3.6 mmol/L (ref 3.5–5.1)
Sodium: 141 mmol/L (ref 135–145)
Total Bilirubin: 0.6 mg/dL (ref 0.3–1.2)
Total Protein: 7.4 g/dL (ref 6.5–8.1)

## 2020-01-19 LAB — HEMOGLOBIN A1C
Hgb A1c MFr Bld: 6.2 % — ABNORMAL HIGH (ref 4.8–5.6)
Mean Plasma Glucose: 131.24 mg/dL

## 2020-01-19 LAB — GLUCOSE, CAPILLARY
Glucose-Capillary: 151 mg/dL — ABNORMAL HIGH (ref 70–99)
Glucose-Capillary: 235 mg/dL — ABNORMAL HIGH (ref 70–99)
Glucose-Capillary: 184 mg/dL — ABNORMAL HIGH (ref 70–99)
Glucose-Capillary: 149 mg/dL — ABNORMAL HIGH (ref 70–99)

## 2020-01-19 LAB — C-REACTIVE PROTEIN: CRP: 11.8 mg/dL — ABNORMAL HIGH (ref <1.0)

## 2020-01-19 LAB — D-DIMER, QUANTITATIVE: D-Dimer, Quant: 1.34 ug/mL-FEU — ABNORMAL HIGH (ref 0.00–0.50)

## 2020-01-19 NOTE — Progress Notes (Signed)
PROGRESS NOTE  Lawrence Henderson  EYC:[id number] DOB: [birth year] DOA: 01/17/2020 PCP: Mayra Neer, MD  Brief Narrative: Lawrence Henderson is an unvaccinated (for covid) 64 y.o. male with a history of HTN, HLD, and positive covid-19 testing 9 days PTA who presented to the ED 7/22 with progressive cough, fever, dyspnea, and fatigue after exposure to his son who had covid-19. In the ED he was afebrile, tachypneic, but not hypoxemic. WBC 7.7k, CRP 21.6, AST 83, ALT 62, d-dimer 1.71. CXR showed bilateral patchy peripheral opacities typical of covid pneumonia.   Assessment & Plan: Principal Problem:   Pneumonia due to COVID-19 virus Active Problems:   Hypertension   Hyperlipidemia   Obesity   Transaminitis   Hyperglycemia   Acute respiratory disease due to COVID-19 virus  Acute hypoxemic respiratory failure due to covid-19 pneumonia: SARS-CoV-2 PCR positive 7/13.  - Wean oxygen as able, though with severity of infiltrates, anticipate prolonged hypoxemia.  - CRP declined significantly with augmented steroids. Would consider tocilizumab if hypoxemia worsens.  - Continue remdesivir x5 days  - Steroids: Solumedrol - Vitamin C, zinc - Encourage OOB, IS, FV, and awake proning if able - Tylenol and antitussives prn - Continue airborne, contact precautions for 21 days from positive testing. - Continue lovenox 40mg  (less than 0.5mg /kg) q12h due to severity of inflammatory state. Hgb remains wnl. In this current condition risk of life-threatening bleed is less likely than risk of life-threatening pulmonary embolism.  LFT elevation: Likely due to covid itself. Appears to be stabilizing. - Continue monitoring. Must stop remdesivir if ALT 10x ULN.  - Hold statin  HTN:  - Continue lisinopril   HLD:  - Hold statin as above  Steroid-induced hyperglycemia, prediabetes:  - Follow up with PCP due to HbA1c 6.2%. - SSI to optimize control.  DVT prophylaxis: Lovenox 40mg  q12h Code Status:  Full Family Communication: None at bedside.   Disposition Plan:  Status is: Inpatient  Remains inpatient appropriate because:Inpatient level of care appropriate due to severity of illness   Dispo: The patient is from: Home              Anticipated d/c is to: Home              Anticipated d/c date is: > 3 days              Patient currently is not medically stable to d/c.  Consultants:   None  Procedures:   None  Antimicrobials:  Remdesivir   Subjective: Feels better than yesterday, able to walk around and finally not so tired that he was able to eat today. No chest pain or bleeding. Still short of breath at rest, mild, moderate when ambulating.   Objective: Vitals:   01/18/20 2045 01/19/20 0150 01/19/20 0550 01/19/20 0806  BP: (!) 132/76 (!) 129/86 (!) 134/87   Pulse: 81 79 85   Resp: 18 18 20    Temp: 98 F (36.7 C) (!) 97.5 F (36.4 C) 97.7 F (36.5 C)   TempSrc: Oral Oral Oral   SpO2: 91% 94% 91% 90%  Weight:      Height:        Intake/Output Summary (Last 24 hours) at 01/19/2020 1427 Last data filed at 01/18/2020 1845 Gross per 24 hour  Intake --  Output 350 ml  Net -350 ml   Filed Weights   01/17/20 1931 01/18/20 1359  Weight: (!) 106.6 kg (!) 102.2 kg   Gen: 64 y.o. male in no distress Pulm:  Nonlabored but tachypneic, does not speak long sentences. Crackles diffusely. CV: Regular rate and rhythm. No murmur, rub, or gallop. No JVD, no dependent edema. GI: Abdomen soft, non-tender, non-distended, with normoactive bowel sounds.  Ext: Warm, no deformities Skin: No rashes, lesions or ulcers on visualized skin. Neuro: Alert and oriented. No focal neurological deficits. Psych: Judgement and insight appear fair. Mood euthymic & affect congruent. Behavior is appropriate.    Data Reviewed: I have personally reviewed following labs and imaging studies  CBC: Recent Labs  Lab 01/17/20 2119 01/18/20 1416 01/19/20 0533  WBC 7.7 4.9 9.1  NEUTROABS  --  3.8  6.8  HGB 14.6 13.9 14.0  HCT 43.6 40.9 43.2  MCV 88.3 88.0 90.9  PLT 214 228 025   Basic Metabolic Panel: Recent Labs  Lab 01/17/20 2120 01/18/20 1416 01/19/20 0533  NA 137 140 141  K 3.7 3.7 3.6  CL 95* 99 100  CO2 26 27 26   GLUCOSE 117* 202* 172*  BUN 21 25* 27*  CREATININE 0.83 0.70 0.83  CALCIUM 8.9 8.7* 9.1  MG  --  3.1*  --   PHOS  --  3.8  --    GFR: Estimated Creatinine Clearance: 110.9 mL/min (by C-G formula based on SCr of 0.83 mg/dL). Liver Function Tests: Recent Labs  Lab 01/17/20 2120 01/18/20 1416 01/19/20 0533  AST 83* 125* 99*  ALT 62* 114* 129*  ALKPHOS 42 40 39  BILITOT 0.9 0.7 0.6  PROT 7.6 7.5 7.4  ALBUMIN 3.3* 3.0* 3.2*   No results for input(s): LIPASE, AMYLASE in the last 168 hours. No results for input(s): AMMONIA in the last 168 hours. Coagulation Profile: No results for input(s): INR, PROTIME in the last 168 hours. Cardiac Enzymes: No results for input(s): CKTOTAL, CKMB, CKMBINDEX, TROPONINI in the last 168 hours. BNP (last 3 results) No results for input(s): PROBNP in the last 8760 hours. HbA1C: Recent Labs    01/19/20 0533  HGBA1C 6.2*   CBG: Recent Labs  Lab 01/18/20 2041 01/19/20 0804 01/19/20 1156  GLUCAP 226* 151* 235*   Lipid Profile: Recent Labs    01/17/20 2120  TRIG 204*   Thyroid Function Tests: No results for input(s): TSH, T4TOTAL, FREET4, T3FREE, THYROIDAB in the last 72 hours. Anemia Panel: Recent Labs    01/17/20 2120 01/18/20 1416  FERRITIN 2,349* 2,634*   Urine analysis:    Component Value Date/Time   COLORURINE AMBER (A) 12/15/2016 1342   APPEARANCEUR HAZY (A) 12/15/2016 1342   LABSPEC 1.027 12/15/2016 1342   PHURINE 6.0 12/15/2016 1342   GLUCOSEU NEGATIVE 12/15/2016 1342   HGBUR NEGATIVE 12/15/2016 1342   BILIRUBINUR NEGATIVE 12/15/2016 1342   KETONESUR NEGATIVE 12/15/2016 1342   PROTEINUR 30 (A) 12/15/2016 1342   UROBILINOGEN 0.2 06/14/2007 1305   NITRITE NEGATIVE 12/15/2016 1342    LEUKOCYTESUR NEGATIVE 12/15/2016 1342   Recent Results (from the past 240 hour(s))  Blood Culture (routine x 2)     Status: None (Preliminary result)   Collection Time: 01/17/20  9:19 PM   Specimen: BLOOD RIGHT FOREARM  Result Value Ref Range Status   Specimen Description   Final    BLOOD RIGHT FOREARM Performed at [facility], [place] [address] [place], [place] [address]    Special Requests   Final    BOTTLES DRAWN AEROBIC AND ANAEROBIC Blood Culture results may not be optimal due to an inadequate volume of blood received in culture bottles Performed at [facility], [address] [place], [place] [address]    Culture   Final    NO GROWTH 2 DAYS Performed at [facility], [place] [address] [place], [place] [address]    Report Status PENDING  Incomplete  Blood Culture (routine x 2)     Status: None (Preliminary result)   Collection Time: 01/17/20  9:19 PM   Specimen: BLOOD  Result Value Ref Range Status   Specimen Description   Final    BLOOD LEFT ANTECUBITAL Performed at [facility] [address] [place], [place] [address]    Special Requests   Final    BOTTLES DRAWN AEROBIC AND ANAEROBIC Blood Culture adequate volume Performed at [facility] [address], [place], [place] [address]    Culture   Final    NO GROWTH 2 DAYS Performed at [facility] [address], [place], [place] [address]    Report Status PENDING  Incomplete      Radiology Studies: DG Chest Port 1 View  Result Date: 01/17/2020 CLINICAL DATA:  COVID positive cough EXAM: PORTABLE CHEST 1 VIEW COMPARISON:  01/28/2017 FINDINGS: Patchy bilateral largely peripheral and left basilar consolidations and ground-glass densities. No pleural effusion. Borderline cardiomegaly. No pneumothorax. IMPRESSION: Patchy bilateral peripheral and left basilar consolidations and ground-glass densities consistent with bilateral pneumonia and history  of COVID positivity. Electronically Signed   By: Donavan Foil M.D.   On: 01/17/2020 20:20    Scheduled Meds: . vitamin C  500 mg Oral Daily  . aspirin EC  81 mg Oral Daily  . enoxaparin (LOVENOX) injection  40 mg Subcutaneous Q12H  . insulin aspart  0-15 Units Subcutaneous TID WC  . insulin aspart  0-5 Units Subcutaneous QHS  . Ipratropium-Albuterol  2 puff Inhalation Q6H  . lisinopril  20 mg Oral Daily  . methylPREDNISolone (SOLU-MEDROL) injection  100 mg Intravenous Q12H  . zinc sulfate  220 mg Oral Daily   Continuous Infusions: . remdesivir 100 mg in NS 100 mL 100 mg (01/19/20 1034)     LOS: 2 days   Time spent: 35 minutes.  Patrecia Pour, MD [facility] [email or web address] 01/19/2020, 2:27 PM

## 2020-01-20 DIAGNOSIS — E6609 Other obesity due to excess calories: Secondary | ICD-10-CM | POA: Diagnosis not present

## 2020-01-20 DIAGNOSIS — U071 COVID-19: Secondary | ICD-10-CM | POA: Diagnosis not present

## 2020-01-20 DIAGNOSIS — R739 Hyperglycemia, unspecified: Secondary | ICD-10-CM | POA: Diagnosis not present

## 2020-01-20 DIAGNOSIS — E782 Mixed hyperlipidemia: Secondary | ICD-10-CM | POA: Diagnosis not present

## 2020-01-20 LAB — COMPREHENSIVE METABOLIC PANEL
ALT: 117 U/L — ABNORMAL HIGH (ref 0–44)
AST: 62 U/L — ABNORMAL HIGH (ref 15–41)
Albumin: 3.1 g/dL — ABNORMAL LOW (ref 3.5–5.0)
Alkaline Phosphatase: 38 U/L (ref 38–126)
Anion gap: 13 (ref 5–15)
BUN: 29 mg/dL — ABNORMAL HIGH (ref 8–23)
CO2: 27 mmol/L (ref 22–32)
Calcium: 8.8 mg/dL — ABNORMAL LOW (ref 8.9–10.3)
Chloride: 101 mmol/L (ref 98–111)
Creatinine, Ser: 0.77 mg/dL (ref 0.61–1.24)
GFR calc Af Amer: >60 mL/min (ref >60)
GFR calc non Af Amer: >60 mL/min (ref >60)
Glucose, Bld: 160 mg/dL — ABNORMAL HIGH (ref 70–99)
Potassium: 3.5 mmol/L (ref 3.5–5.1)
Sodium: 141 mmol/L (ref 135–145)
Total Bilirubin: 1 mg/dL (ref 0.3–1.2)
Total Protein: 6.8 g/dL (ref 6.5–8.1)

## 2020-01-20 LAB — GLUCOSE, CAPILLARY
Glucose-Capillary: 139 mg/dL — ABNORMAL HIGH (ref 70–99)
Glucose-Capillary: 167 mg/dL — ABNORMAL HIGH (ref 70–99)
Glucose-Capillary: 157 mg/dL — ABNORMAL HIGH (ref 70–99)
Glucose-Capillary: 189 mg/dL — ABNORMAL HIGH (ref 70–99)

## 2020-01-20 LAB — C-REACTIVE PROTEIN: CRP: 6.3 mg/dL — ABNORMAL HIGH (ref <1.0)

## 2020-01-20 LAB — D-DIMER, QUANTITATIVE: D-Dimer, Quant: 0.9 ug/mL-FEU — ABNORMAL HIGH (ref 0.00–0.50)

## 2020-01-20 MED ORDER — ENOXAPARIN SODIUM 40 MG/0.4ML ~~LOC~~ SOLN
40.0000 mg | SUBCUTANEOUS | Status: DC
  Administered 2020-01-21 – 2020-01-24 (×4): 40 mg via SUBCUTANEOUS
  Filled 2020-01-20 (×4): qty 0.4

## 2020-01-20 NOTE — Progress Notes (Signed)
PROGRESS NOTE  Lawrence Henderson  KZS:[id number] DOB: Nov 03, 1955 DOA: 01/17/2020 PCP: Mayra Neer, MD  Brief Narrative: Lawrence Henderson is an unvaccinated (for covid) 64 y.o. male with a history of HTN, HLD, and positive covid-19 testing 9 days PTA who presented to the ED 7/22 with progressive cough, fever, dyspnea, and fatigue after exposure to his son who had covid-19. In the ED he was afebrile, tachypneic, but not hypoxemic. WBC 7.7k, CRP 21.6, AST 83, ALT 62, d-dimer 1.71. CXR showed bilateral patchy peripheral opacities typical of covid pneumonia.   Assessment & Plan: Principal Problem:   Pneumonia due to COVID-19 virus Active Problems:   Hypertension   Hyperlipidemia   Obesity   Transaminitis   Hyperglycemia   Acute respiratory disease due to COVID-19 virus  Acute hypoxemic respiratory failure due to covid-19 pneumonia: SARS-CoV-2 PCR positive 7/13.  - Wean oxygen as able, though with severity of infiltrates, anticipate prolonged hypoxemia. Ready for PT, ambulatory pulse oximetry today. - CRP declined significantly with augmented steroids, will continue at this level given lack of severe hyperglycemia and CRP still >6.  - Continue remdesivir x5 days  - Steroids: Solumedrol - Vitamin C, zinc - Encourage OOB, IS, FV, and awake proning if able - Tylenol and antitussives prn - Continue airborne, contact precautions for 21 days from positive testing. - Continue lovenox, will return to typical dosing w/ d-dimer <1 and initiation of more mobilization.  LFT elevation: Likely due to covid itself.  - Improving, will monitor intermittently. Must stop remdesivir if ALT 10x ULN.  - Holding statin  HTN:  - Continue lisinopril   HLD:  - Hold statin as above  Steroid-induced hyperglycemia, prediabetes:  - Follow up with PCP due to HbA1c 6.2%. - SSI to optimize control.  DVT prophylaxis: Lovenox 40mg  q24h Code Status: Full Family Communication: None at bedside.   Disposition  Plan:  Status is: Inpatient  Remains inpatient appropriate because:Inpatient level of care appropriate due to severity of illness, level of hypoxemia.  Dispo: The patient is from: Home              Anticipated d/c is to: Home              Anticipated d/c date is: 3 days              Patient currently is not medically stable to d/c.  Consultants:   None  Procedures:   None  Antimicrobials:  Remdesivir 7/22 - 7/26.  Subjective: Still short of breath at rest, worse with exertion but stable, no fever, could not tolerate prone positioning. Cough remains severe, persistent, nonproductive.    Objective: Vitals:   01/19/20 2109 01/20/20 0544 01/20/20 1200 01/20/20 1200  BP: (!) 137/81 (!) 142/98  (!) 135/87  Pulse: 87 79  84  Resp: 22 20  20   Temp: 98 F (36.7 C) 97.9 F (36.6 C)  98.7 F (37.1 C)  TempSrc: Oral Oral  Oral  SpO2: 91% 94% 92% 92%  Weight:  (!) 102.1 kg    Height:        Intake/Output Summary (Last 24 hours) at 01/20/2020 1231 Last data filed at 01/20/2020 1200 Gross per 24 hour  Intake 180 ml  Output --  Net 180 ml   Filed Weights   01/17/20 1931 01/18/20 1359 01/20/20 0544  Weight: (!) 106.6 kg (!) 102.2 kg (!) 102.1 kg   Gen: 64 y.o. male in no distress Pulm: Nonlabored breathing supplemental oxygen, crackles bilaterally without  wheezes. CV: Regular rate and rhythm. No murmur, rub, or gallop. No JVD, no dependent edema. GI: Abdomen soft, non-tender, non-distended, with normoactive bowel sounds.  Ext: Warm, no deformities Skin: No rashes, lesions or ulcers on visualized skin. Neuro: Alert and oriented. No focal neurological deficits. Psych: Judgement and insight appear fair. Mood euthymic & affect congruent. Behavior is appropriate.    Data Reviewed: I have personally reviewed following labs and imaging studies  CBC: Recent Labs  Lab 01/17/20 2119 01/18/20 1416 01/19/20 0533  WBC 7.7 4.9 9.1  NEUTROABS  --  3.8 6.8  HGB 14.6 13.9 14.0   HCT 43.6 40.9 43.2  MCV 88.3 88.0 90.9  PLT 214 228 240   Basic Metabolic Panel: Recent Labs  Lab 01/17/20 2120 01/18/20 1416 01/19/20 0533 01/20/20 0437  NA 137 140 141 141  K 3.7 3.7 3.6 3.5  CL 95* 99 100 101  CO2 26 27 26 27   GLUCOSE 117* 202* 172* 160*  BUN 21 25* 27* 29*  CREATININE 0.83 0.70 0.83 0.77  CALCIUM 8.9 8.7* 9.1 8.8*  MG  --  3.1*  --   --   PHOS  --  3.8  --   --    GFR: Estimated Creatinine Clearance: 115 mL/min (by C-G formula based on SCr of 0.77 mg/dL). Liver Function Tests: Recent Labs  Lab 01/17/20 2120 01/18/20 1416 01/19/20 0533 01/20/20 0437  AST 83* 125* 99* 62*  ALT 62* 114* 129* 117*  ALKPHOS 42 40 39 38  BILITOT 0.9 0.7 0.6 1.0  PROT 7.6 7.5 7.4 6.8  ALBUMIN 3.3* 3.0* 3.2* 3.1*   No results for input(s): LIPASE, AMYLASE in the last 168 hours. No results for input(s): AMMONIA in the last 168 hours. Coagulation Profile: No results for input(s): INR, PROTIME in the last 168 hours. Cardiac Enzymes: No results for input(s): CKTOTAL, CKMB, CKMBINDEX, TROPONINI in the last 168 hours. BNP (last 3 results) No results for input(s): PROBNP in the last 8760 hours. HbA1C: Recent Labs    01/19/20 0533  HGBA1C 6.2*   CBG: Recent Labs  Lab 01/19/20 1156 01/19/20 1647 01/19/20 2114 01/20/20 0805 01/20/20 1156  GLUCAP 235* 184* 149* 139* 167*   Lipid Profile: Recent Labs    01/17/20 2120  TRIG 204*   Thyroid Function Tests: No results for input(s): TSH, T4TOTAL, FREET4, T3FREE, THYROIDAB in the last 72 hours. Anemia Panel: Recent Labs    01/17/20 2120 01/18/20 1416  FERRITIN 2,349* 2,634*   Urine analysis:    Component Value Date/Time   COLORURINE AMBER (A) 12/15/2016 1342   APPEARANCEUR HAZY (A) 12/15/2016 1342   LABSPEC 1.027 12/15/2016 1342   PHURINE 6.0 12/15/2016 1342   GLUCOSEU NEGATIVE 12/15/2016 1342   HGBUR NEGATIVE 12/15/2016 1342   BILIRUBINUR NEGATIVE 12/15/2016 1342   KETONESUR NEGATIVE 12/15/2016 1342    PROTEINUR 30 (A) 12/15/2016 1342   UROBILINOGEN 0.2 06/14/2007 1305   NITRITE NEGATIVE 12/15/2016 1342   LEUKOCYTESUR NEGATIVE 12/15/2016 1342   Recent Results (from the past 240 hour(s))  Blood Culture (routine x 2)     Status: None (Preliminary result)   Collection Time: 01/17/20  9:19 PM   Specimen: BLOOD RIGHT FOREARM  Result Value Ref Range Status   Specimen Description   Final    BLOOD RIGHT FOREARM Performed at [facility], [place] [address], [place], [place] [address]    Special Requests   Final    BOTTLES DRAWN AEROBIC AND ANAEROBIC Blood Culture results may  not be optimal due to an inadequate volume of blood received in culture bottles Performed at [facility] [address], [place], [place] [address]    Culture   Final    NO GROWTH 3 DAYS Performed at [facility], [place] [address], [place], [place] [address]    Report Status PENDING  Incomplete  Blood Culture (routine x 2)     Status: None (Preliminary result)   Collection Time: 01/17/20  9:19 PM   Specimen: BLOOD  Result Value Ref Range Status   Specimen Description   Final    BLOOD LEFT ANTECUBITAL Performed at [facility] [address], [place], [place] [address]    Special Requests   Final    BOTTLES DRAWN AEROBIC AND ANAEROBIC Blood Culture adequate volume Performed at [facility] [address] [place], [place] [address]    Culture   Final    NO GROWTH 3 DAYS Performed at [facility], [place] [address], [place], [place] [address]    Report Status PENDING  Incomplete      Radiology Studies: No results found.  Scheduled Meds: . vitamin C  500 mg Oral Daily  . aspirin EC  81 mg Oral Daily  . enoxaparin (LOVENOX) injection  40 mg Subcutaneous Q12H  . insulin aspart  0-15 Units Subcutaneous TID WC  . insulin aspart  0-5 Units Subcutaneous QHS  . Ipratropium-Albuterol  2 puff Inhalation Q6H  . lisinopril  20 mg Oral Daily   . methylPREDNISolone (SOLU-MEDROL) injection  100 mg Intravenous Q12H  . zinc sulfate  220 mg Oral Daily   Continuous Infusions: . remdesivir 100 mg in NS 100 mL 100 mg (01/20/20 0910)     LOS: 3 days   Time spent: 25 minutes.  Patrecia Pour, MD [facility] [email or web address] 01/20/2020, 12:31 PM

## 2020-01-21 DIAGNOSIS — E6609 Other obesity due to excess calories: Secondary | ICD-10-CM | POA: Diagnosis not present

## 2020-01-21 DIAGNOSIS — R739 Hyperglycemia, unspecified: Secondary | ICD-10-CM | POA: Diagnosis not present

## 2020-01-21 DIAGNOSIS — U071 COVID-19: Secondary | ICD-10-CM | POA: Diagnosis not present

## 2020-01-21 DIAGNOSIS — E782 Mixed hyperlipidemia: Secondary | ICD-10-CM | POA: Diagnosis not present

## 2020-01-21 LAB — GLUCOSE, CAPILLARY
Glucose-Capillary: 129 mg/dL — ABNORMAL HIGH (ref 70–99)
Glucose-Capillary: 188 mg/dL — ABNORMAL HIGH (ref 70–99)
Glucose-Capillary: 146 mg/dL — ABNORMAL HIGH (ref 70–99)
Glucose-Capillary: 159 mg/dL — ABNORMAL HIGH (ref 70–99)

## 2020-01-21 LAB — COMPREHENSIVE METABOLIC PANEL
ALT: 127 U/L — ABNORMAL HIGH (ref 0–44)
AST: 51 U/L — ABNORMAL HIGH (ref 15–41)
Albumin: 3 g/dL — ABNORMAL LOW (ref 3.5–5.0)
Alkaline Phosphatase: 36 U/L — ABNORMAL LOW (ref 38–126)
Anion gap: 13 (ref 5–15)
BUN: 24 mg/dL — ABNORMAL HIGH (ref 8–23)
CO2: 28 mmol/L (ref 22–32)
Calcium: 8.5 mg/dL — ABNORMAL LOW (ref 8.9–10.3)
Chloride: 99 mmol/L (ref 98–111)
Creatinine, Ser: 0.72 mg/dL (ref 0.61–1.24)
GFR calc Af Amer: >60 mL/min (ref >60)
GFR calc non Af Amer: >60 mL/min (ref >60)
Glucose, Bld: 147 mg/dL — ABNORMAL HIGH (ref 70–99)
Potassium: 3.8 mmol/L (ref 3.5–5.1)
Sodium: 140 mmol/L (ref 135–145)
Total Bilirubin: 1 mg/dL (ref 0.3–1.2)
Total Protein: 6.6 g/dL (ref 6.5–8.1)

## 2020-01-21 LAB — D-DIMER, QUANTITATIVE: D-Dimer, Quant: 0.86 ug/mL-FEU — ABNORMAL HIGH (ref 0.00–0.50)

## 2020-01-21 LAB — C-REACTIVE PROTEIN: CRP: 3.5 mg/dL — ABNORMAL HIGH (ref <1.0)

## 2020-01-21 MED ORDER — METHYLPREDNISOLONE SODIUM SUCC 125 MG IJ SOLR
80.0000 mg | Freq: Two times a day (BID) | INTRAMUSCULAR | Status: DC
  Administered 2020-01-21 – 2020-01-22 (×3): 80 mg via INTRAVENOUS
  Filled 2020-01-21 (×3): qty 2

## 2020-01-21 NOTE — Progress Notes (Signed)
PROGRESS NOTE  Lawrence Henderson  ZRA:[id number] DOB: 1955-10-22 DOA: 01/17/2020 PCP: Mayra Neer, MD  Brief Narrative: Lawrence Henderson is an unvaccinated (for covid) 64 y.o. male with a history of HTN, HLD, and positive covid-19 testing 9 days PTA who presented to the ED 7/22 with progressive cough, fever, dyspnea, and fatigue after exposure to his son who had covid-19. In the ED he was afebrile, tachypneic, but not hypoxemic. WBC 7.7k, CRP 21.6, AST 83, ALT 62, d-dimer 1.71. CXR showed bilateral patchy peripheral opacities typical of covid pneumonia. Remdesivir and steroids started.   Assessment & Plan: Principal Problem:   Pneumonia due to COVID-19 virus Active Problems:   Hypertension   Hyperlipidemia   Obesity   Transaminitis   Hyperglycemia   Acute respiratory disease due to COVID-19 virus  Acute hypoxemic respiratory failure due to covid-19 pneumonia: SARS-CoV-2 PCR positive 7/13.  - Wean oxygen as able, down to 85% with exertion on 4L today.  - CRP declined significantly, decrease steroids modestly today. - Completed the course of remdesivir x5 days today - Steroids: Solumedrol 80mg  IV BID - Vitamin C, zinc - Encourage OOB, IS, FV, and awake proning if able - Tylenol and antitussives prn - Continue airborne, contact precautions for 21 days from positive testing. - Continue lovenox.  LFT elevation: Likely due to covid itself.  - Remains mild. Must stop remdesivir if ALT 10x ULN.  - Holding statin  HTN:  - Continue lisinopril   HLD:  - Hold statin as above  Steroid-induced hyperglycemia, prediabetes:  - Follow up with PCP due to HbA1c 6.2%. - SSI to optimize control.  DVT prophylaxis: Lovenox 40mg  q24h Code Status: Full Family Communication: None at bedside.   Disposition Plan:  Status is: Inpatient  Remains inpatient appropriate because:Inpatient level of care appropriate due to severity of illness, level of hypoxemia.  Dispo: The patient is from: Home               Anticipated d/c is to: Home              Anticipated d/c date is: 2 days              Patient currently is not medically stable to d/c.  Consultants:   None  Procedures:   None  Antimicrobials:  Remdesivir 7/22 - 7/26.  Subjective: Walking in room intermittently, gets short of breath, no chest pain or other new symptoms. Cough remains severe, persistent, nonproductive.   Objective: Vitals:   01/20/20 1200 01/20/20 2047 01/21/20 0511 01/21/20 1221  BP: (!) 135/87 (!) 132/74 (!) 135/84 (!) 159/91  Pulse: 84 87 78 85  Resp: 20 20 20 22   Temp: 98.7 F (37.1 C) 98.2 F (36.8 C) 98.1 F (36.7 C) 98.4 F (36.9 C)  TempSrc: Oral Oral Oral Oral  SpO2: 92% 93% 93% 90%  Weight:      Height:       No intake or output data in the 24 hours ending 01/21/20 1709 Filed Weights   01/17/20 1931 01/18/20 1359 01/20/20 0544  Weight: (!) 106.6 kg (!) 102.2 kg (!) 102.1 kg   Gen: 64 y.o. male in no distress Pulm: Nonlabored breathing 4L, crackles R > L base improved.  CV: Regular rate and rhythm. No murmur, rub, or gallop. No JVD, no dependent edema. GI: Abdomen soft, non-tender, non-distended, with normoactive bowel sounds.  Ext: Warm, no deformities Skin: No rashes, lesions or ulcers on visualized skin. Neuro: Alert and oriented. No focal  neurological deficits. Psych: Judgement and insight appear fair. Mood euthymic & affect congruent. Behavior is appropriate.    Data Reviewed: I have personally reviewed following labs and imaging studies  CBC: Recent Labs  Lab 01/17/20 2119 01/18/20 1416 01/19/20 0533  WBC 7.7 4.9 9.1  NEUTROABS  --  3.8 6.8  HGB 14.6 13.9 14.0  HCT 43.6 40.9 43.2  MCV 88.3 88.0 90.9  PLT 214 228 903   Basic Metabolic Panel: Recent Labs  Lab 01/17/20 2120 01/18/20 1416 01/19/20 0533 01/20/20 0437 01/21/20 0500  NA 137 140 141 141 140  K 3.7 3.7 3.6 3.5 3.8  CL 95* 99 100 101 99  CO2 26 27 26 27 28   GLUCOSE 117* 202* 172* 160* 147*    BUN 21 25* 27* 29* 24*  CREATININE 0.83 0.70 0.83 0.77 0.72  CALCIUM 8.9 8.7* 9.1 8.8* 8.5*  MG  --  3.1*  --   --   --   PHOS  --  3.8  --   --   --    GFR: Estimated Creatinine Clearance: 115 mL/min (by C-G formula based on SCr of 0.72 mg/dL). Liver Function Tests: Recent Labs  Lab 01/17/20 2120 01/18/20 1416 01/19/20 0533 01/20/20 0437 01/21/20 0500  AST 83* 125* 99* 62* 51*  ALT 62* 114* 129* 117* 127*  ALKPHOS 42 40 39 38 36*  BILITOT 0.9 0.7 0.6 1.0 1.0  PROT 7.6 7.5 7.4 6.8 6.6  ALBUMIN 3.3* 3.0* 3.2* 3.1* 3.0*   No results for input(s): LIPASE, AMYLASE in the last 168 hours. No results for input(s): AMMONIA in the last 168 hours. Coagulation Profile: No results for input(s): INR, PROTIME in the last 168 hours. Cardiac Enzymes: No results for input(s): CKTOTAL, CKMB, CKMBINDEX, TROPONINI in the last 168 hours. BNP (last 3 results) No results for input(s): PROBNP in the last 8760 hours. HbA1C: Recent Labs    01/19/20 0533  HGBA1C 6.2*   CBG: Recent Labs  Lab 01/20/20 1156 01/20/20 1632 01/20/20 2044 01/21/20 0752 01/21/20 1218  GLUCAP 167* 157* 189* 129* 188*   Lipid Profile: No results for input(s): CHOL, HDL, LDLCALC, TRIG, CHOLHDL, LDLDIRECT in the last 72 hours. Thyroid Function Tests: No results for input(s): TSH, T4TOTAL, FREET4, T3FREE, THYROIDAB in the last 72 hours. Anemia Panel: No results for input(s): VITAMINB12, FOLATE, FERRITIN, TIBC, IRON, RETICCTPCT in the last 72 hours. Urine analysis:    Component Value Date/Time   COLORURINE AMBER (A) 12/15/2016 1342   APPEARANCEUR HAZY (A) 12/15/2016 1342   LABSPEC 1.027 12/15/2016 1342   PHURINE 6.0 12/15/2016 1342   GLUCOSEU NEGATIVE 12/15/2016 1342   HGBUR NEGATIVE 12/15/2016 1342   BILIRUBINUR NEGATIVE 12/15/2016 1342   KETONESUR NEGATIVE 12/15/2016 1342   PROTEINUR 30 (A) 12/15/2016 1342   UROBILINOGEN 0.2 06/14/2007 1305   NITRITE NEGATIVE 12/15/2016 1342   LEUKOCYTESUR NEGATIVE  12/15/2016 1342   Recent Results (from the past 240 hour(s))  Blood Culture (routine x 2)     Status: None (Preliminary result)   Collection Time: 01/17/20  9:19 PM   Specimen: BLOOD RIGHT FOREARM  Result Value Ref Range Status   Specimen Description   Final    BLOOD RIGHT FOREARM Performed at [facility], [place] [address], [place], [place] [address]    Special Requests   Final    BOTTLES DRAWN AEROBIC AND ANAEROBIC Blood Culture results may not be optimal due to an inadequate volume of blood received in culture bottles Performed at [facility]  [place] [address] [place], [place] [address]    Culture   Final    NO GROWTH 4 DAYS Performed at [facility], [place] [address], [place], [place] [address]    Report Status PENDING  Incomplete  Blood Culture (routine x 2)     Status: None (Preliminary result)   Collection Time: 01/17/20  9:19 PM   Specimen: BLOOD  Result Value Ref Range Status   Specimen Description   Final    BLOOD LEFT ANTECUBITAL Performed at [facility] [address], [place], [place] [address]    Special Requests   Final    BOTTLES DRAWN AEROBIC AND ANAEROBIC Blood Culture adequate volume Performed at [facility] [address], [place], [place] [address]    Culture   Final    NO GROWTH 4 DAYS Performed at [facility], [place] [address], [place], [place] [address]    Report Status PENDING  Incomplete      Radiology Studies: No results found.  Scheduled Meds: . vitamin C  500 mg Oral Daily  . aspirin EC  81 mg Oral Daily  . enoxaparin (LOVENOX) injection  40 mg Subcutaneous Q24H  . insulin aspart  0-15 Units Subcutaneous TID WC  . insulin aspart  0-5 Units Subcutaneous QHS  . Ipratropium-Albuterol  2 puff Inhalation Q6H  . lisinopril  20 mg Oral Daily  . methylPREDNISolone (SOLU-MEDROL) injection  80 mg Intravenous Q12H  . zinc sulfate  220 mg Oral Daily   Continuous  Infusions:    LOS: 4 days   Time spent: 25 minutes.  Patrecia Pour, MD [facility] [email or web address] 01/21/2020, 5:09 PM

## 2020-01-21 NOTE — Evaluation (Signed)
Physical Therapy Evaluation-1x Patient Details Name: LONNEY REVAK MRN: [id number] DOB: 09-15-1955 Today's Date: 01/21/2020   History of Present Illness  64 yo male admitted with COVID Pna.  Clinical Impression  On eval, pt was Mod Ind with mobility. He walked ~75 feet around his room. O2 85% on 4L [place] O2 with activity. No acute PT needs. 1x eval. Will sign off.     Follow Up Recommendations No PT follow up    Equipment Recommendations  None recommended by PT    Recommendations for Other Services       Precautions / Restrictions Precautions Precautions: None Precaution Comments: monitor O2 Restrictions Weight Bearing Restrictions: No      Mobility  Bed Mobility Overal bed mobility: Modified Independent                Transfers Overall transfer level: Modified independent                  Ambulation/Gait Ambulation/Gait assistance: Modified independent (Device/Increase time) Gait Distance (Feet): 75 Feet Assistive device: None       General Gait Details: walked several laps around room. no lob. O2 85% on 4LNC. Dyspnea 2/4  Stairs            Wheelchair Mobility    Modified Rankin (Stroke Patients Only)       Balance Overall balance assessment: No apparent balance deficits (not formally assessed)                                           Pertinent Vitals/Pain Pain Assessment: Faces Faces Pain Scale: Hurts a little bit Pain Location: R lower abd Pain Descriptors / Indicators: Discomfort;Sore Pain Intervention(s): Monitored during session    Home Living Family/patient expects to be discharged to:: Private residence Living Arrangements: Spouse/significant other Available Help at Discharge: Family Type of Home: House Home Access: Stairs to enter Entrance Stairs-Rails: Right Entrance Stairs-Number of Steps: 1 flight Home Layout: Multi-level Home Equipment: None      Prior Function Level of Independence:  Independent               Hand Dominance        Extremity/Trunk Assessment   Upper Extremity Assessment Upper Extremity Assessment: Overall WFL for tasks assessed    Lower Extremity Assessment Lower Extremity Assessment: Overall WFL for tasks assessed    Cervical / Trunk Assessment Cervical / Trunk Assessment: Normal  Communication   Communication: No difficulties  Cognition Arousal/Alertness: Awake/alert Behavior During Therapy: WFL for tasks assessed/performed Overall Cognitive Status: Within Functional Limits for tasks assessed                                        General Comments      Exercises     Assessment/Plan    PT Assessment Patent does not need any further PT services  PT Problem List         PT Treatment Interventions      PT Goals (Current goals can be found in the Care Plan section)  Acute Rehab PT Goals Patient Stated Goal: get better PT Goal Formulation: All assessment and education complete, DC therapy    Frequency     Barriers to discharge        Co-evaluation  AM-PAC PT "6 Clicks" Mobility  Outcome Measure Help needed turning from your back to your side while in a flat bed without using bedrails?: None Help needed moving from lying on your back to sitting on the side of a flat bed without using bedrails?: None Help needed moving to and from a bed to a chair (including a wheelchair)?: None Help needed standing up from a chair using your arms (e.g., wheelchair or bedside chair)?: None Help needed to walk in hospital room?: None Help needed climbing 3-5 steps with a railing? : None 6 Click Score: 24    End of Session   Activity Tolerance: Patient tolerated treatment well Patient left: in bed;with call bell/phone within reach        Time: 1517-1534 PT Time Calculation (min) (ACUTE ONLY): 17 min   Charges:   PT Evaluation $PT Eval Low Complexity: [place],  PT Acute Rehabilitation  Office: [phone] Pager: [phone]

## 2020-01-22 DIAGNOSIS — E782 Mixed hyperlipidemia: Secondary | ICD-10-CM | POA: Diagnosis not present

## 2020-01-22 DIAGNOSIS — E6609 Other obesity due to excess calories: Secondary | ICD-10-CM | POA: Diagnosis not present

## 2020-01-22 DIAGNOSIS — R739 Hyperglycemia, unspecified: Secondary | ICD-10-CM | POA: Diagnosis not present

## 2020-01-22 DIAGNOSIS — U071 COVID-19: Secondary | ICD-10-CM | POA: Diagnosis not present

## 2020-01-22 LAB — COMPREHENSIVE METABOLIC PANEL
ALT: 90 U/L — ABNORMAL HIGH (ref 0–44)
AST: 23 U/L (ref 15–41)
Albumin: 3 g/dL — ABNORMAL LOW (ref 3.5–5.0)
Alkaline Phosphatase: 37 U/L — ABNORMAL LOW (ref 38–126)
Anion gap: 12 (ref 5–15)
BUN: 21 mg/dL (ref 8–23)
CO2: 28 mmol/L (ref 22–32)
Calcium: 8.6 mg/dL — ABNORMAL LOW (ref 8.9–10.3)
Chloride: 100 mmol/L (ref 98–111)
Creatinine, Ser: 0.68 mg/dL (ref 0.61–1.24)
GFR calc Af Amer: >60 mL/min (ref >60)
GFR calc non Af Amer: >60 mL/min (ref >60)
Glucose, Bld: 145 mg/dL — ABNORMAL HIGH (ref 70–99)
Potassium: 4.7 mmol/L (ref 3.5–5.1)
Sodium: 140 mmol/L (ref 135–145)
Total Bilirubin: 0.6 mg/dL (ref 0.3–1.2)
Total Protein: 6.5 g/dL (ref 6.5–8.1)

## 2020-01-22 LAB — CULTURE, BLOOD (ROUTINE X 2)
Culture: NO GROWTH
Culture: NO GROWTH
Special Requests: ADEQUATE

## 2020-01-22 LAB — GLUCOSE, CAPILLARY
Glucose-Capillary: 119 mg/dL — ABNORMAL HIGH (ref 70–99)
Glucose-Capillary: 199 mg/dL — ABNORMAL HIGH (ref 70–99)
Glucose-Capillary: 269 mg/dL — ABNORMAL HIGH (ref 70–99)
Glucose-Capillary: 167 mg/dL — ABNORMAL HIGH (ref 70–99)

## 2020-01-22 LAB — D-DIMER, QUANTITATIVE: D-Dimer, Quant: 0.85 ug/mL-FEU — ABNORMAL HIGH (ref 0.00–0.50)

## 2020-01-22 LAB — C-REACTIVE PROTEIN: CRP: 1.6 mg/dL — ABNORMAL HIGH (ref <1.0)

## 2020-01-22 MED ORDER — METHYLPREDNISOLONE SODIUM SUCC 125 MG IJ SOLR
60.0000 mg | Freq: Two times a day (BID) | INTRAMUSCULAR | Status: DC
  Administered 2020-01-22 – 2020-01-24 (×4): 60 mg via INTRAVENOUS
  Filled 2020-01-22 (×4): qty 2

## 2020-01-22 NOTE — Progress Notes (Signed)
PROGRESS NOTE  Lawrence Henderson  XBM:[id number] DOB: December 01, 1955 DOA: 01/17/2020 PCP: Mayra Neer, MD  Brief Narrative: Lawrence Henderson is an unvaccinated (for covid) 64 y.o. male with a history of HTN, HLD, and positive covid-19 testing 9 days PTA who presented to the ED 7/22 with progressive cough, fever, dyspnea, and fatigue after exposure to his son who had covid-19. In the ED he was afebrile, tachypneic, but not hypoxemic. WBC 7.7k, CRP 21.6, AST 83, ALT 62, d-dimer 1.71. CXR showed bilateral patchy peripheral opacities typical of covid pneumonia. Remdesivir and steroids started.   Assessment & Plan: Principal Problem:   Pneumonia due to COVID-19 virus Active Problems:   Hypertension   Hyperlipidemia   Obesity   Transaminitis   Hyperglycemia   Acute respiratory disease due to COVID-19 virus  Acute hypoxemic respiratory failure due to covid-19 pneumonia: SARS-CoV-2 PCR positive 7/13.  - Wean oxygen as able, check ambulatory pulse oximetry again today. - CRP declined significantly, decrease solumedrol to 60mg  IV BID (1.2mg /kg/d) - Completed the course of remdesivir x5 days  - Continue airborne, contact precautions for 21 days from positive testing. - Continue lovenox at typical dosing.  LFT elevation: Likely due to covid itself.  - Improving, ALT 90. - Holding statin  HTN:  - Continue lisinopril   HLD:  - Holding statin acutely as above  Steroid-induced hyperglycemia, prediabetes:  - Follow up with PCP due to HbA1c 6.2%. - SSI to optimize control, decrease steroid dosing to minimize hyperglycemia.  DVT prophylaxis: Lovenox 40mg  q24h Code Status: Full Family Communication: None at bedside.   Disposition Plan:  Status is: Inpatient  Remains inpatient appropriate because:Inpatient level of care appropriate due to severity of illness, level of hypoxemia.  Dispo: The patient is from: Home              Anticipated d/c is to: Home              Anticipated d/c date is:  2 days              Patient currently is not medically stable to d/c.  Consultants:   None  Procedures:   None  Antimicrobials:  Remdesivir 7/22 - 7/26.  Subjective: Shortness of breath about stable, worse with exertion, but better with oxygen. Upper abdomen/lower chest pain with cough is better, cough is less frequent.   Objective: Vitals:   01/21/20 1221 01/21/20 2104 01/22/20 0629 01/22/20 1133  BP: (!) 159/91 119/80 (!) 134/87 (!) 143/87  Pulse: 85 85 77 78  Resp: 22 18 18 18   Temp: 98.4 F (36.9 C) 98.2 F (36.8 C) 98.4 F (36.9 C) 98.4 F (36.9 C)  TempSrc: Oral Oral Oral Oral  SpO2: 90% 90% 90% 93%  Weight:      Height:       No intake or output data in the 24 hours ending 01/22/20 1404 Filed Weights   01/17/20 1931 01/18/20 1359 01/20/20 0544  Weight: (!) 106.6 kg (!) 102.2 kg (!) 102.1 kg   Gen: 64 y.o. male in no distress Pulm: Nonlabored breathing 4L, crackles at R base. CV: Regular rate and rhythm. No murmur, rub, or gallop. No JVD, no dependent edema. GI: Abdomen soft, non-tender, non-distended, with normoactive bowel sounds.  Ext: Warm, no deformities Skin: No rashes, lesions or ulcers on visualized skin. Neuro: Alert and oriented. No focal neurological deficits. Psych: Judgement and insight appear fair. Mood euthymic & affect congruent. Behavior is appropriate.    Data Reviewed: I  have personally reviewed following labs and imaging studies  CBC: Recent Labs  Lab 01/17/20 2119 01/18/20 1416 01/19/20 0533  WBC 7.7 4.9 9.1  NEUTROABS  --  3.8 6.8  HGB 14.6 13.9 14.0  HCT 43.6 40.9 43.2  MCV 88.3 88.0 90.9  PLT 214 228 270   Basic Metabolic Panel: Recent Labs  Lab 01/18/20 1416 01/19/20 0533 01/20/20 0437 01/21/20 0500 01/22/20 0459  NA 140 141 141 140 140  K 3.7 3.6 3.5 3.8 4.7  CL 99 100 101 99 100  CO2 27 26 27 28 28   GLUCOSE 202* 172* 160* 147* 145*  BUN 25* 27* 29* 24* 21  CREATININE 0.70 0.83 0.77 0.72 0.68  CALCIUM 8.7*  9.1 8.8* 8.5* 8.6*  MG 3.1*  --   --   --   --   PHOS 3.8  --   --   --   --    GFR: Estimated Creatinine Clearance: 115 mL/min (by C-G formula based on SCr of 0.68 mg/dL). Liver Function Tests: Recent Labs  Lab 01/18/20 1416 01/19/20 0533 01/20/20 0437 01/21/20 0500 01/22/20 0459  AST 125* 99* 62* 51* 23  ALT 114* 129* 117* 127* 90*  ALKPHOS 40 39 38 36* 37*  BILITOT 0.7 0.6 1.0 1.0 0.6  PROT 7.5 7.4 6.8 6.6 6.5  ALBUMIN 3.0* 3.2* 3.1* 3.0* 3.0*   No results for input(s): LIPASE, AMYLASE in the last 168 hours. No results for input(s): AMMONIA in the last 168 hours. Coagulation Profile: No results for input(s): INR, PROTIME in the last 168 hours. Cardiac Enzymes: No results for input(s): CKTOTAL, CKMB, CKMBINDEX, TROPONINI in the last 168 hours. BNP (last 3 results) No results for input(s): PROBNP in the last 8760 hours. HbA1C: No results for input(s): HGBA1C in the last 72 hours. CBG: Recent Labs  Lab 01/21/20 1218 01/21/20 1753 01/21/20 2101 01/22/20 0804 01/22/20 1131  GLUCAP 188* 146* 159* 119* 199*   Lipid Profile: No results for input(s): CHOL, HDL, LDLCALC, TRIG, CHOLHDL, LDLDIRECT in the last 72 hours. Thyroid Function Tests: No results for input(s): TSH, T4TOTAL, FREET4, T3FREE, THYROIDAB in the last 72 hours. Anemia Panel: No results for input(s): VITAMINB12, FOLATE, FERRITIN, TIBC, IRON, RETICCTPCT in the last 72 hours. Urine analysis:    Component Value Date/Time   COLORURINE AMBER (A) 12/15/2016 1342   APPEARANCEUR HAZY (A) 12/15/2016 1342   LABSPEC 1.027 12/15/2016 1342   PHURINE 6.0 12/15/2016 1342   GLUCOSEU NEGATIVE 12/15/2016 1342   HGBUR NEGATIVE 12/15/2016 1342   BILIRUBINUR NEGATIVE 12/15/2016 1342   KETONESUR NEGATIVE 12/15/2016 1342   PROTEINUR 30 (A) 12/15/2016 1342   UROBILINOGEN 0.2 06/14/2007 1305   NITRITE NEGATIVE 12/15/2016 1342   LEUKOCYTESUR NEGATIVE 12/15/2016 1342   Recent Results (from the past 240 hour(s))  Blood  Culture (routine x 2)     Status: None   Collection Time: 01/17/20  9:19 PM   Specimen: BLOOD RIGHT FOREARM  Result Value Ref Range Status   Specimen Description   Final    BLOOD RIGHT FOREARM Performed at [facility], [place] [address] [place], [place] [address]    Special Requests   Final    BOTTLES DRAWN AEROBIC AND ANAEROBIC Blood Culture results may not be optimal due to an inadequate volume of blood received in culture bottles Performed at [facility] [address], [place], [place] [address]    Culture   Final    NO GROWTH 5 DAYS Performed at [facility], [place] [address] [place], [place] [address]    Report Status 01/22/2020 FINAL  Final  Blood Culture (routine x 2)     Status: None   Collection Time: 01/17/20  9:19 PM   Specimen: BLOOD  Result Value Ref Range Status   Specimen Description   Final    BLOOD LEFT ANTECUBITAL Performed at [facility] [address], [place], [place] [address]    Special Requests   Final    BOTTLES DRAWN AEROBIC AND ANAEROBIC Blood Culture adequate volume Performed at [place] [facility] [address] [place], [place] [address]    Culture   Final    NO GROWTH 5 DAYS Performed at [facility], [place] [address] [place], [place] [address]    Report Status 01/22/2020 FINAL  Final      Radiology Studies: No results found.  Scheduled Meds: . vitamin C  500 mg Oral Daily  . aspirin EC  81 mg Oral Daily  . enoxaparin (LOVENOX) injection  40 mg Subcutaneous Q24H  . insulin aspart  0-15 Units Subcutaneous TID WC  . insulin aspart  0-5 Units Subcutaneous QHS  . Ipratropium-Albuterol  2 puff Inhalation Q6H  . lisinopril  20 mg Oral Daily  . methylPREDNISolone (SOLU-MEDROL) injection  80 mg Intravenous Q12H  . zinc sulfate  220 mg Oral Daily   Continuous Infusions:    LOS: 5 days   Time spent: 25 minutes.  Patrecia Pour, MD [facility] [email or web address] 01/22/2020, 2:04 PM

## 2020-01-22 NOTE — Final Progress Note (Signed)
SATURATION QUALIFICATIONS: (This note is used to comply with regulatory documentation for home oxygen)  Patient Saturations on Room Air at Rest = 93%  Patient Saturations on Room Air while Ambulating = 87%  Patient Saturations on 4 Liters of oxygen while Ambulating =91%  Please briefly explain why patient needs home oxygen:

## 2020-01-22 NOTE — Plan of Care (Signed)
  Problem: Education: Goal: Knowledge of risk factors and measures for prevention of condition will improve Outcome: Progressing   Problem: Coping: Goal: Psychosocial and spiritual needs will be supported Outcome: Progressing   Problem: Respiratory: Goal: Will maintain a patent airway Outcome: Progressing Goal: Complications related to the disease process, condition or treatment will be avoided or minimized Outcome: Progressing   

## 2020-01-23 DIAGNOSIS — E782 Mixed hyperlipidemia: Secondary | ICD-10-CM | POA: Diagnosis not present

## 2020-01-23 DIAGNOSIS — E6609 Other obesity due to excess calories: Secondary | ICD-10-CM | POA: Diagnosis not present

## 2020-01-23 DIAGNOSIS — U071 COVID-19: Secondary | ICD-10-CM | POA: Diagnosis not present

## 2020-01-23 DIAGNOSIS — I10 Essential (primary) hypertension: Secondary | ICD-10-CM | POA: Diagnosis not present

## 2020-01-23 LAB — CBC WITH DIFFERENTIAL/PLATELET
Abs Immature Granulocytes: 0.92 10*3/uL — ABNORMAL HIGH (ref 0.00–0.07)
Basophils Absolute: 0.1 10*3/uL (ref 0.0–0.1)
Basophils Relative: 0 %
Eosinophils Absolute: 0 10*3/uL (ref 0.0–0.5)
Eosinophils Relative: 0 %
HCT: 43 % (ref 39.0–52.0)
Hemoglobin: 13.9 g/dL (ref 13.0–17.0)
Immature Granulocytes: 6 %
Lymphocytes Relative: 7 %
Lymphs Abs: 1.3 10*3/uL (ref 0.7–4.0)
MCH: 29.9 pg (ref 26.0–34.0)
MCHC: 32.3 g/dL (ref 30.0–36.0)
MCV: 92.5 fL (ref 80.0–100.0)
Monocytes Absolute: 0.6 10*3/uL (ref 0.1–1.0)
Monocytes Relative: 4 %
Neutro Abs: 13.9 10*3/uL — ABNORMAL HIGH (ref 1.7–7.7)
Neutrophils Relative %: 83 %
Platelets: 371 10*3/uL (ref 150–400)
RBC: 4.65 MIL/uL (ref 4.22–5.81)
RDW: 12.8 % (ref 11.5–15.5)
WBC: 16.8 10*3/uL — ABNORMAL HIGH (ref 4.0–10.5)
nRBC: 0 % (ref 0.0–0.2)

## 2020-01-23 LAB — COMPREHENSIVE METABOLIC PANEL
ALT: 69 U/L — ABNORMAL HIGH (ref 0–44)
AST: 20 U/L (ref 15–41)
Albumin: 3.1 g/dL — ABNORMAL LOW (ref 3.5–5.0)
Alkaline Phosphatase: 37 U/L — ABNORMAL LOW (ref 38–126)
Anion gap: 13 (ref 5–15)
BUN: 21 mg/dL (ref 8–23)
CO2: 29 mmol/L (ref 22–32)
Calcium: 8.9 mg/dL (ref 8.9–10.3)
Chloride: 97 mmol/L — ABNORMAL LOW (ref 98–111)
Creatinine, Ser: 0.67 mg/dL (ref 0.61–1.24)
GFR calc Af Amer: >60 mL/min (ref >60)
GFR calc non Af Amer: >60 mL/min (ref >60)
Glucose, Bld: 158 mg/dL — ABNORMAL HIGH (ref 70–99)
Potassium: 4.8 mmol/L (ref 3.5–5.1)
Sodium: 139 mmol/L (ref 135–145)
Total Bilirubin: 0.8 mg/dL (ref 0.3–1.2)
Total Protein: 6.4 g/dL — ABNORMAL LOW (ref 6.5–8.1)

## 2020-01-23 LAB — GLUCOSE, CAPILLARY
Glucose-Capillary: 95 mg/dL (ref 70–99)
Glucose-Capillary: 117 mg/dL — ABNORMAL HIGH (ref 70–99)
Glucose-Capillary: 153 mg/dL — ABNORMAL HIGH (ref 70–99)
Glucose-Capillary: 129 mg/dL — ABNORMAL HIGH (ref 70–99)

## 2020-01-23 LAB — MAGNESIUM: Magnesium: 2.6 mg/dL — ABNORMAL HIGH (ref 1.7–2.4)

## 2020-01-23 LAB — PHOSPHORUS: Phosphorus: 3.4 mg/dL (ref 2.5–4.6)

## 2020-01-23 NOTE — Plan of Care (Signed)
  Problem: Education: Goal: Knowledge of risk factors and measures for prevention of condition will improve Outcome: Progressing   Problem: Coping: Goal: Psychosocial and spiritual needs will be supported Outcome: Progressing   Problem: Respiratory: Goal: Will maintain a patent airway Outcome: Progressing Goal: Complications related to the disease process, condition or treatment will be avoided or minimized Outcome: Progressing   

## 2020-01-23 NOTE — Progress Notes (Signed)
PROGRESS NOTE    Lawrence Henderson  ZOX:[id number] DOB: 11/05/1955 DOA: 01/17/2020 PCP: Mayra Neer, MD   Brief Narrative:  Patient is a 64 year old obese Caucasian male who is unvaccinated for COVID-19 disease with a past medical history significant for but not limited to hypertension, hyperlipidemia as well as other comorbidities who tested positive for COVID-19 9 days prior to admission and presented to the ED on 01/17/2020 with progressive cough, fever, dyspnea, and fatigue after exposure to a son who had COVID-19 disease.  In the ED he was afebrile and tachypneic but was not hypoxemic at that time.  WBC on admission was 7.7 and CRP was 21.6 and an elevated LFTs and elevated D-dimer.  Chest x-ray showed bilateral patchy peripheral opacities to both cover pneumonia.  He started on remdesivir and steroids and currently his oxygen saturation is weaning.  He is down to 4 L currently and he is also status post 5 days of remdesivir and his Solu-Medrol has been weaned to 1.2 mg/kg/day.  Assessment & Plan:   Principal Problem:   Pneumonia due to COVID-19 virus Active Problems:   Hypertension   Hyperlipidemia   Obesity   Transaminitis   Hyperglycemia   Acute respiratory disease due to COVID-19 virus  Acute Respiratory Failure with Hypoxia 2/2 to COVID-19 Disease  -SARS-CoV-2 PCR positive on 7/13.  -Inflammatory Marker Tren  Recent Labs    01/21/20 0500 01/22/20 0459  DDIMER 0.86* 0.85*  CRP 3.5* 1.6*  LDH was 522 on Admission, TG was 204, Ferritin was 2349 and trended up to 2634 -PCT was <0.10 No results found for: SARSCOV2NAA -Ferritin was 571, LDH was 234; Fibrinogen was >800 -PCT was 0.74 -SpO2: 95 % O2 Flow Rate (L/min): 3 L/min -Completed Remdesivir x5 days 7/22- 7/26; -C/w Steroids x10 days; Was weaned to IV Solumedrol 60 mg q12h from 100 mg q12h and today is Day 6/10 -Continue airborne, contact precautions for 21 days from positive testing. -Contine Monitor inflammatory  markers and LFTs.  - Enoxaparin 40mg  q24h, CRP now <5; D-dimer elevating, no hx VTE. -C/w Combivent 2 puff IH q6h -Continue to monitor temperature curve and continue with acetaminophen for mild fever -Antitussives with p.o. Robitussin-DM and Tussionex -Continue with zinc sulfate 220 mg p.o. daily along with vitamin C 500 mg p.o. daily -Prone if able -Continue monitor respiratory status carefully and wean O2 as tolerated -We will need a repeat ambulatory home O2 screen prior to discharge; Last one showed he requires O2; repeat in a.m.  Abnormal LFT's/LFT Elevation -Likely due to covid itself.  -Improving as AST is gone from 51 is now 20 and ALT has gone from 127 and is now 53 -Continue holding statin -Repeat CMP in the AM  HTN -Continue Lisinopril 20 mg po Daily   HLD  - Holding statin acutely as above  Steroid-induced Hyperglycemia, Prediabetes - Will Follow up with PCP due to HbA1c 6.2%. -C/w Moderate Novolog SSI to optimize control, decrease steroid dosing to minimize hyperglycemia. -CBG's ranging from 95-117; blood sugar on CMP today was 158 -Continue monitor blood sugars carefully and adjust insulin as necessary  Leukocytosis -Patient WBC is elevated in the setting of steroid demargination -Continue monitor for signs and symptoms of infection -Repeat CBC in a.m.  Obesity -Estimated body mass index is 31.39 kg/m as calculated from the following:   Height as of this encounter: 5\' 11"  (1.803 m).   Weight as of this encounter: 102.1 kg. -Weight Loss and Dietary Counseling given   DVT  prophylaxis: Enoxaparin 40 mg sq q24h Code Status: FULL CODE  Family Communication: No family present at bedside  Disposition Plan: Pending further improvement back to baseline and improvement of his respiratory status  Status is: Inpatient  Remains inpatient appropriate because:Unsafe d/c plan, IV treatments appropriate due to intensity of illness or inability to take PO and Inpatient  level of care appropriate due to severity of illness   Dispo: The patient is from: Home              Anticipated d/c is to: Home              Anticipated d/c date is: 1-2 days              Patient currently is not medically stable to d/c.  Consultants:   None   Procedures:  None  Antimicrobials:  Anti-infectives (From admission, onward)   Start     Dose/Rate Route Frequency Ordered Stop   01/18/20 1000  remdesivir 100 mg in sodium chloride 0.9 % 100 mL IVPB        100 mg 200 mL/hr over 30 Minutes Intravenous Daily 01/17/20 2047 01/21/20 0948   01/17/20 2100  remdesivir 100 mg in sodium chloride 0.9 % 100 mL IVPB        100 mg 200 mL/hr over 30 Minutes Intravenous Every 1 hr x 2 01/17/20 2047 01/18/20 0000     Subjective: Seen and examined at bedside he is sitting in the chair and states that he is feeling little bit better but still feels short of breath whenever he exerts himself.  Also complains of some pain when he coughs but states that his cough frequency is not as frequent.  Denies any lightheadedness or dizziness.  No other concerns or close at this time and feels better since coming in but still not feeling back to his baseline.  Objective: Vitals:   01/22/20 2045 01/23/20 0505 01/23/20 1200 01/23/20 1324  BP: (!) 144/85 (!) 151/98  (!) 135/80  Pulse: 83 72  85  Resp: 20 19  18   Temp: 97.8 F (36.6 C) 97.8 F (36.6 C)  98 F (36.7 C)  TempSrc: Oral Oral  Oral  SpO2: 92% 92% 94% 95%  Weight:      Height:        Intake/Output Summary (Last 24 hours) at 01/23/2020 1354 Last data filed at 01/23/2020 0900 Gross per 24 hour  Intake 480 ml  Output --  Net 480 ml   Filed Weights   01/17/20 1931 01/18/20 1359 01/20/20 0544  Weight: (!) 106.6 kg (!) 102.2 kg (!) 102.1 kg   Examination: Physical Exam:  Constitutional: WN/WD obese Caucasian male currently in, NAD and appears calm  Eyes: Lids and conjunctivae normal, sclerae anicteric  ENMT: External Ears, Nose  appear normal. Grossly normal hearing.  Neck: Appears normal, supple, no cervical masses, normal ROM, no appreciable thyromegaly: No JVD Respiratory: Diminished to auscultation bilaterally with coarse breath sounds and some crackles at the bases but no appreciable wheezing, rales, rhonchi.  He has a normal respiratory effort is not tachypneic but he is wearing supplemental oxygen via nasal cannula. Cardiovascular: RRR, no murmurs / rubs / gallops. S1 and S2 auscultated.  Minimal extremity edema Abdomen: Soft, non-tender, distended secondary body habitus. Bowel sounds positive.  GU: Deferred. Musculoskeletal: No clubbing / cyanosis of digits/nails. No joint deformity upper and lower extremities.  Skin: No rashes, lesions, ulcers on limited skin evaluation. No induration; Warm and dry.  Neurologic: CN 2-12 grossly intact with no focal deficits. Romberg sign cerebellar reflexes not assessed.  Psychiatric: Normal judgment and insight. Alert and oriented x 3. Normal mood and appropriate affect.   Data Reviewed: I have personally reviewed following labs and imaging studies  CBC: Recent Labs  Lab 01/17/20 2119 01/18/20 1416 01/19/20 0533 01/23/20 1031  WBC 7.7 4.9 9.1 16.8*  NEUTROABS  --  3.8 6.8 13.9*  HGB 14.6 13.9 14.0 13.9  HCT 43.6 40.9 43.2 43.0  MCV 88.3 88.0 90.9 92.5  PLT 214 228 279 330   Basic Metabolic Panel: Recent Labs  Lab 01/18/20 1416 01/18/20 1416 01/19/20 0533 01/20/20 0437 01/21/20 0500 01/22/20 0459 01/23/20 1031  NA 140   < > 141 141 140 140 139  K 3.7   < > 3.6 3.5 3.8 4.7 4.8  CL 99   < > 100 101 99 100 97*  CO2 27   < > 26 27 28 28 29   GLUCOSE 202*   < > 172* 160* 147* 145* 158*  BUN 25*   < > 27* 29* 24* 21 21  CREATININE 0.70   < > 0.83 0.77 0.72 0.68 0.67  CALCIUM 8.7*   < > 9.1 8.8* 8.5* 8.6* 8.9  MG 3.1*  --   --   --   --   --  2.6*  PHOS 3.8  --   --   --   --   --  3.4   < > = values in this interval not displayed.   GFR: Estimated  Creatinine Clearance: 115 mL/min (by C-G formula based on SCr of 0.67 mg/dL). Liver Function Tests: Recent Labs  Lab 01/19/20 0533 01/20/20 0437 01/21/20 0500 01/22/20 0459 01/23/20 1031  AST 99* 62* 51* 23 20  ALT 129* 117* 127* 90* 69*  ALKPHOS 39 38 36* 37* 37*  BILITOT 0.6 1.0 1.0 0.6 0.8  PROT 7.4 6.8 6.6 6.5 6.4*  ALBUMIN 3.2* 3.1* 3.0* 3.0* 3.1*   No results for input(s): LIPASE, AMYLASE in the last 168 hours. No results for input(s): AMMONIA in the last 168 hours. Coagulation Profile: No results for input(s): INR, PROTIME in the last 168 hours. Cardiac Enzymes: No results for input(s): CKTOTAL, CKMB, CKMBINDEX, TROPONINI in the last 168 hours. BNP (last 3 results) No results for input(s): PROBNP in the last 8760 hours. HbA1C: No results for input(s): HGBA1C in the last 72 hours. CBG: Recent Labs  Lab 01/22/20 1131 01/22/20 1701 01/22/20 2208 01/23/20 0755 01/23/20 1202  GLUCAP 199* 269* 167* 95 117*   Lipid Profile: No results for input(s): CHOL, HDL, LDLCALC, TRIG, CHOLHDL, LDLDIRECT in the last 72 hours. Thyroid Function Tests: No results for input(s): TSH, T4TOTAL, FREET4, T3FREE, THYROIDAB in the last 72 hours. Anemia Panel: No results for input(s): VITAMINB12, FOLATE, FERRITIN, TIBC, IRON, RETICCTPCT in the last 72 hours. Sepsis Labs: Recent Labs  Lab 01/17/20 2120 01/17/20 2326  PROCALCITON <0.10  --   LATICACIDVEN 1.9 1.3    Recent Results (from the past 240 hour(s))  Blood Culture (routine x 2)     Status: None   Collection Time: 01/17/20  9:19 PM   Specimen: BLOOD RIGHT FOREARM  Result Value Ref Range Status   Specimen Description   Final    BLOOD RIGHT FOREARM Performed at [facility], [place] [address], [place],  [address]    Special Requests   Final    BOTTLES DRAWN AEROBIC AND ANAEROBIC Blood Culture results may  not be optimal due to an inadequate volume of blood received in culture bottles Performed at [facility] [address], [place], [place] [address]    Culture   Final    NO GROWTH 5 DAYS Performed at [facility], [place] [address], [place], [place] [address]    Report Status 01/22/2020 FINAL  Final  Blood Culture (routine x 2)     Status: None   Collection Time: 01/17/20  9:19 PM   Specimen: BLOOD  Result Value Ref Range Status   Specimen Description   Final    BLOOD LEFT ANTECUBITAL Performed at [facility] [address] [place], [place] [address]    Special Requests   Final    BOTTLES DRAWN AEROBIC AND ANAEROBIC Blood Culture adequate volume Performed at [facility] [address], [place], [place] [address]    Culture   Final    NO GROWTH 5 DAYS Performed at [facility], [place] [address], [place], [place] [address]    Report Status 01/22/2020 FINAL  Final    RN Pressure Injury Documentation:     Estimated body mass index is 31.39 kg/m as calculated from the following:   Height as of this encounter: 5\' 11"  (1.803 m).   Weight as of this encounter: 102.1 kg.  Malnutrition Type:      Malnutrition Characteristics:      Nutrition Interventions:     Radiology Studies: No results found.  Scheduled Meds:  vitamin C  500 mg Oral Daily   aspirin EC  81 mg Oral Daily   enoxaparin (LOVENOX) injection  40 mg Subcutaneous Q24H   insulin aspart  0-15 Units Subcutaneous TID WC   insulin aspart  0-5 Units Subcutaneous QHS   Ipratropium-Albuterol  2 puff Inhalation Q6H   lisinopril  20 mg Oral Daily   methylPREDNISolone (SOLU-MEDROL) injection  60 mg Intravenous Q12H   zinc sulfate  220 mg Oral Daily   Continuous Infusions:   LOS: 6 days   Kerney Elbe, DO [facility] PAGER is on AMION  If 7PM-7AM, please contact night-coverage [email or web address]

## 2020-01-24 ENCOUNTER — Inpatient Hospital Stay (HOSPITAL_COMMUNITY): Payer: 59

## 2020-01-24 DIAGNOSIS — R739 Hyperglycemia, unspecified: Secondary | ICD-10-CM | POA: Diagnosis not present

## 2020-01-24 DIAGNOSIS — E6609 Other obesity due to excess calories: Secondary | ICD-10-CM | POA: Diagnosis not present

## 2020-01-24 DIAGNOSIS — U071 COVID-19: Secondary | ICD-10-CM | POA: Diagnosis not present

## 2020-01-24 DIAGNOSIS — E782 Mixed hyperlipidemia: Secondary | ICD-10-CM | POA: Diagnosis not present

## 2020-01-24 DIAGNOSIS — R7303 Prediabetes: Secondary | ICD-10-CM

## 2020-01-24 LAB — FIBRINOGEN: Fibrinogen: 463 mg/dL (ref 210–475)

## 2020-01-24 LAB — COMPREHENSIVE METABOLIC PANEL
ALT: 63 U/L — ABNORMAL HIGH (ref 0–44)
AST: 22 U/L (ref 15–41)
Albumin: 3.2 g/dL — ABNORMAL LOW (ref 3.5–5.0)
Alkaline Phosphatase: 37 U/L — ABNORMAL LOW (ref 38–126)
Anion gap: 12 (ref 5–15)
BUN: 21 mg/dL (ref 8–23)
CO2: 26 mmol/L (ref 22–32)
Calcium: 8.8 mg/dL — ABNORMAL LOW (ref 8.9–10.3)
Chloride: 99 mmol/L (ref 98–111)
Creatinine, Ser: 0.62 mg/dL (ref 0.61–1.24)
GFR calc Af Amer: >60 mL/min (ref >60)
GFR calc non Af Amer: >60 mL/min (ref >60)
Glucose, Bld: 145 mg/dL — ABNORMAL HIGH (ref 70–99)
Potassium: 4.9 mmol/L (ref 3.5–5.1)
Sodium: 137 mmol/L (ref 135–145)
Total Bilirubin: 0.7 mg/dL (ref 0.3–1.2)
Total Protein: 6.7 g/dL (ref 6.5–8.1)

## 2020-01-24 LAB — CBC WITH DIFFERENTIAL/PLATELET
Abs Immature Granulocytes: 0.87 10*3/uL — ABNORMAL HIGH (ref 0.00–0.07)
Basophils Absolute: 0.1 10*3/uL (ref 0.0–0.1)
Basophils Relative: 0 %
Eosinophils Absolute: 0 10*3/uL (ref 0.0–0.5)
Eosinophils Relative: 0 %
HCT: 42.7 % (ref 39.0–52.0)
Hemoglobin: 14.2 g/dL (ref 13.0–17.0)
Immature Granulocytes: 6 %
Lymphocytes Relative: 7 %
Lymphs Abs: 1 10*3/uL (ref 0.7–4.0)
MCH: 29.7 pg (ref 26.0–34.0)
MCHC: 33.3 g/dL (ref 30.0–36.0)
MCV: 89.3 fL (ref 80.0–100.0)
Monocytes Absolute: 0.5 10*3/uL (ref 0.1–1.0)
Monocytes Relative: 3 %
Neutro Abs: 13.1 10*3/uL — ABNORMAL HIGH (ref 1.7–7.7)
Neutrophils Relative %: 84 %
Platelets: 387 10*3/uL (ref 150–400)
RBC: 4.78 MIL/uL (ref 4.22–5.81)
RDW: 12.9 % (ref 11.5–15.5)
WBC: 15.6 10*3/uL — ABNORMAL HIGH (ref 4.0–10.5)
nRBC: 0 % (ref 0.0–0.2)

## 2020-01-24 LAB — SEDIMENTATION RATE: Sed Rate: 29 mm/hr — ABNORMAL HIGH (ref 0–16)

## 2020-01-24 LAB — D-DIMER, QUANTITATIVE: D-Dimer, Quant: 0.72 ug/mL-FEU — ABNORMAL HIGH (ref 0.00–0.50)

## 2020-01-24 LAB — C-REACTIVE PROTEIN: CRP: 1.1 mg/dL — ABNORMAL HIGH (ref <1.0)

## 2020-01-24 LAB — FERRITIN: Ferritin: 626 ng/mL — ABNORMAL HIGH (ref 24–336)

## 2020-01-24 LAB — GLUCOSE, CAPILLARY
Glucose-Capillary: 105 mg/dL — ABNORMAL HIGH (ref 70–99)
Glucose-Capillary: 154 mg/dL — ABNORMAL HIGH (ref 70–99)

## 2020-01-24 LAB — MAGNESIUM: Magnesium: 2.4 mg/dL (ref 1.7–2.4)

## 2020-01-24 LAB — LACTATE DEHYDROGENASE: LDH: 257 U/L — ABNORMAL HIGH (ref 98–192)

## 2020-01-24 LAB — PHOSPHORUS: Phosphorus: 4.2 mg/dL (ref 2.5–4.6)

## 2020-01-24 MED ORDER — ACETAMINOPHEN 325 MG PO TABS: 650.0000 mg | ORAL_TABLET | Freq: Four times a day (QID) | ORAL | 0 refills | Status: DC | PRN

## 2020-01-24 MED ORDER — PREDNISONE 10 MG (21) PO TBPK
ORAL_TABLET | ORAL | 0 refills | Status: DC
Start: 2020-01-24 — End: 2020-02-12

## 2020-01-24 MED ORDER — ZINC SULFATE 220 (50 ZN) MG PO CAPS: 220.0000 mg | ORAL_CAPSULE | Freq: Every day | ORAL | 0 refills | Status: DC

## 2020-01-24 MED ORDER — IPRATROPIUM-ALBUTEROL 20-100 MCG/ACT IN AERS: 1.0000 | INHALATION_SPRAY | Freq: Four times a day (QID) | RESPIRATORY_TRACT | 0 refills | Status: DC

## 2020-01-24 MED ORDER — GUAIFENESIN-DM 100-10 MG/5ML PO SYRP: 10.0000 mL | ORAL_SOLUTION | ORAL | 0 refills | Status: DC | PRN

## 2020-01-24 MED ORDER — HYDROCOD POLST-CPM POLST ER 10-8 MG/5ML PO SUER: 5.0000 mL | Freq: Two times a day (BID) | ORAL | 0 refills | Status: DC | PRN

## 2020-01-24 NOTE — Discharge Summary (Signed)
Physician Discharge Summary  Lawrence Henderson EVO:[id number] DOB: Feb 18, 1956 DOA: 01/17/2020  PCP: Mayra Neer, MD  Admit date: 01/17/2020 Discharge date: 01/24/2020  Admitted From: Home Disposition: Home  Recommendations for Outpatient Follow-up:  1. Follow up with PCP in 1-2 weeks 2. Follow up with Pulmonary in the outpatient setting within 1-2 weeks 3. Please obtain CMP/CBC, Mag, Phos in one week 4. Repeat CXR in 3-6 weeks 5. Isolate for 21 Days total since testing positive  6. Please follow up on the following pending results:  Home Health: No Equipment/Devices: Supplemental O2 via   Discharge Condition: Stable  CODE STATUS: FULL CODE  Diet recommendation: Heart Healthy Carb Modified Diet   Brief/Interim Summary: Patient is a 64 year old obese Caucasian male who is unvaccinated for COVID-19 disease with a past medical history significant for but not limited to hypertension, hyperlipidemia as well as other comorbidities who tested positive for COVID-19 9 days prior to admission and presented to the ED on 01/17/2020 with progressive cough, fever, dyspnea, and fatigue after exposure to a son who had COVID-19 disease.  In the ED he was afebrile and tachypneic but was not hypoxemic at that time.  WBC on admission was 7.7 and CRP was 21.6 and an elevated LFTs and elevated D-dimer.  Chest x-ray showed bilateral patchy peripheral opacities to both cover pneumonia.  He started on remdesivir and steroids and currently his oxygen saturation is weaning.  He is down to 2 L currently and he is also status post 5 days of remdesivir and his Solu-Medrol has been weaned to 1.2 mg/kg/day.  We will further wean his Solu-Medrol and changed to p.o. prednisone for Sterapred taper.  Patient ambulated and desaturated so will be sent home on 2 L of an oxygen.  Patient is much improved and he is deemed stable to be discharged at this time will need to follow-up with PCP as well as pulmonary in outpatient  setting.  Patient understands and agrees with the plan of care.  Discharge Diagnoses:  Principal Problem:   Pneumonia due to COVID-19 virus Active Problems:   Hypertension   Hyperlipidemia   Obesity   Transaminitis   Hyperglycemia   Acute respiratory disease due to COVID-19 virus   Pre-diabetes  Acute Respiratory Failure with Hypoxia 2/2 to COVID-19 Disease and COVID PNA, improving   -SARS-CoV-2 PCR positive on 7/13. -Inflammatory Marker Trend Recent Labs    01/22/20 0459 01/24/20 0439  DDIMER 0.85* 0.72*  FERRITIN  --  626*  LDH  --  257*  CRP 1.6* 1.1*  -LDH was 522 on Admission, TG was 204, Ferritin was 2349 and trended up to 2634 -PCT was <0.10 No results found for: SARSCOV2NAA -Ferritin was 571, LDH was 234; Fibrinogen was >800 -PCT was 0.74 -SpO2: 93 % O2 Flow Rate (L/min): 2 L/min -Completed Remdesivir x5 days 7/22- 7/26; -C/w Steroids x10 days; Was weaned to IV Solumedrol 60 mg q12hfrom 100 mg q12h and today is Day 7/10; will be sent out on a Sterapred taper -Continue airborne, contact precautions for 21 days from positive testing. -ContineMonitor inflammatory markers and LFTs.  -Enoxaparin 40mg q24h, CRP now <5; D-dimerelevating, no hx VTE. -C/w Combivent 2 puff IH q6h and will wait for discharge -Continue to monitor temperature curve and continue with acetaminophen for mild fever -Antitussives withp.o. Robitussin-DM and Tussionex -Continue with zinc sulfate 220 mg p.o. daily along with vitaminC500 mg p.o. daily -Prone if able -Continue monitor respiratory status carefully and wean O2 as tolerated -Ambulatory home O2 screen  done and showed patient requires oxygen.  Patient is stable to be discharged home with oxygen only to follow-up with PCP and pulmonary in outpatient setting.  Abnormal LFT's/LFT Elevation, improving -Likely due to covid itself.  -Improving as AST is gone from 51 is now 20 and ALT has gone from 127 and is now 48 -Continue holding  statin and likely can resume at discharge -Repeat CMP in the AM  HTN -Continue Lisinopril 20 mg po Daily   HLD  - Holdingstatin acutely as above while hospitalized but likely can be resumed at discharge  Steroid-induced Hyperglycemia, Prediabetes - Will Follow up with PCP due to HbA1c 6.2%. -C/w Moderate Novolog SSI to optimize control, decrease steroid dosing to minimize hyperglycemia. -CBG's ranging from 95-117; blood sugar on CMP today was 145 -Continue monitor blood sugars carefully and adjust insulin as necessary -Follow-up with PCP for further blood sugar monitoring  Leukocytosis -Patient WBC is elevated in the setting of steroid demargination -Continue monitor for signs and symptoms of infection -Repeat CBC in a.m.  Obesity -Estimated body mass index is 31.39 kg/m as calculated from the following:   Height as of this encounter: 5\' 11"  (1.803 m).   Weight as of this encounter: 102.1 kg. -Weight Loss and Dietary Counseling given    Discharge Instructions  Discharge Instructions    Call MD for:  difficulty breathing, headache or visual disturbances   Complete by: As directed    Call MD for:  extreme fatigue   Complete by: As directed    Call MD for:  hives   Complete by: As directed    Call MD for:  persistant dizziness or light-headedness   Complete by: As directed    Call MD for:  persistant nausea and vomiting   Complete by: As directed    Call MD for:  redness, tenderness, or signs of infection (pain, swelling, redness, odor or green/yellow discharge around incision site)   Complete by: As directed    Call MD for:  severe uncontrolled pain   Complete by: As directed    Call MD for:  temperature >100.4   Complete by: As directed    Diet - low sodium heart healthy   Complete by: As directed    Diet Carb Modified   Complete by: As directed    Discharge instructions   Complete by: As directed    You were cared for by a hospitalist during your hospital  stay. If you have any questions about your discharge medications or the care you received while you were in the hospital after you are discharged, you can call the unit and ask to speak with the hospitalist on call if the hospitalist that took care of you is not available. Once you are discharged, your primary care physician will handle any further medical issues. Please note that NO REFILLS for any discharge medications will be authorized once you are discharged, as it is imperative that you return to your primary care physician (or establish a relationship with a primary care physician if you do not have one) for your aftercare needs so that they can reassess your need for medications and monitor your lab values.  Follow up with PCP and Pulmonary in the outpatient setting. Take all medications as prescribed. If symptoms change or worsen please return to the ED for evaluation   Infection Prevention Recommendations for Individuals Confirmed to have, or Being Evaluated for, 2019 Novel Coronavirus (COVID-19) Infection Who Receive Care at Home  Individuals who  are confirmed to have, or are being evaluated for, COVID-19 should follow the prevention steps below until a healthcare provider or local or state health department says they can return to normal activities.  Stay home except to get medical care You should restrict activities outside your home, except for getting medical care. Do not go to work, school, or public areas, and do not use public transportation or taxis.  Call ahead before visiting your doctor Before your medical appointment, call the healthcare provider and tell them that you have, or are being evaluated for, COVID-19 infection. This will help the healthcare provider's office take steps to keep other people from getting infected. Ask your healthcare provider to call the local or state health department.  Monitor your symptoms Seek prompt medical attention if your illness is  worsening (e.g., difficulty breathing). Before going to your medical appointment, call the healthcare provider and tell them that you have, or are being evaluated for, COVID-19 infection. Ask your healthcare provider to call the local or state health department.  Wear a facemask You should wear a facemask that covers your nose and mouth when you are in the same room with other people and when you visit a healthcare provider. People who live with or visit you should also wear a facemask while they are in the same room with you.  Separate yourself from other people in your home As much as possible, you should stay in a different room from other people in your home. Also, you should use a separate bathroom, if available.  Avoid sharing household items You should not share dishes, drinking glasses, cups, eating utensils, towels, bedding, or other items with other people in your home. After using these items, you should wash them thoroughly with soap and water.  Cover your coughs and sneezes Cover your mouth and nose with a tissue when you cough or sneeze, or you can cough or sneeze into your sleeve. Throw used tissues in a lined trash can, and immediately wash your hands with soap and water for at least 20 seconds or use an alcohol-based hand rub.  Wash your [facility] your hands often and thoroughly with soap and water for at least 20 seconds. You can use an alcohol-based hand sanitizer if soap and water are not available and if your hands are not visibly dirty. Avoid touching your eyes, nose, and mouth with unwashed hands.   Prevention Steps for Caregivers and Household Members of Individuals Confirmed to have, or Being Evaluated for, COVID-19 Infection Being Cared for in the Home  If you live with, or provide care at home for, a person confirmed to have, or being evaluated for, COVID-19 infection please follow these guidelines to prevent infection:  Follow healthcare provider's  instructions Make sure that you understand and can help the patient follow any healthcare provider instructions for all care.  Provide for the patient's basic needs You should help the patient with basic needs in the home and provide support for getting groceries, prescriptions, and other personal needs.  Monitor the patient's symptoms If they are getting sicker, call his or her medical provider and tell them that the patient has, or is being evaluated for, COVID-19 infection. This will help the healthcare provider's office take steps to keep other people from getting infected. Ask the healthcare provider to call the local or state health department.  Limit the number of people who have contact with the patient If possible, have only one caregiver for the patient. Other  household members should stay in another home or place of residence. If this is not possible, they should stay in another room, or be separated from the patient as much as possible. Use a separate bathroom, if available. Restrict visitors who do not have an essential need to be in the home.  Keep older adults, very young children, and other sick people away from the patient Keep older adults, very young children, and those who have compromised immune systems or chronic health conditions away from the patient. This includes people with chronic heart, lung, or kidney conditions, diabetes, and cancer.  Ensure good ventilation Make sure that shared spaces in the home have good air flow, such as from an air conditioner or an opened window, weather permitting.  Wash your hands often Wash your hands often and thoroughly with soap and water for at least 20 seconds. You can use an alcohol based hand sanitizer if soap and water are not available and if your hands are not visibly dirty. Avoid touching your eyes, nose, and mouth with unwashed hands. Use disposable paper towels to dry your hands. If not available, use dedicated cloth  towels and replace them when they become wet.  Wear a facemask and gloves Wear a disposable facemask at all times in the room and gloves when you touch or have contact with the patient's blood, body fluids, and/or secretions or excretions, such as sweat, saliva, sputum, nasal mucus, vomit, urine, or feces.  Ensure the mask fits over your nose and mouth tightly, and do not touch it during use. Throw out disposable facemasks and gloves after using them. Do not reuse. Wash your hands immediately after removing your facemask and gloves. If your personal clothing becomes contaminated, carefully remove clothing and launder. Wash your hands after handling contaminated clothing. Place all used disposable facemasks, gloves, and other waste in a lined container before disposing them with other household waste. Remove gloves and wash your hands immediately after handling these items.  Do not share dishes, glasses, or other household items with the patient Avoid sharing household items. You should not share dishes, drinking glasses, cups, eating utensils, towels, bedding, or other items with a patient who is confirmed to have, or being evaluated for, COVID-19 infection. After the person uses these items, you should wash them thoroughly with soap and water.  Wash laundry thoroughly Immediately remove and wash clothes or bedding that have blood, body fluids, and/or secretions or excretions, such as sweat, saliva, sputum, nasal mucus, vomit, urine, or feces, on them. Wear gloves when handling laundry from the patient. Read and follow directions on labels of laundry or clothing items and detergent. In general, wash and dry with the warmest temperatures recommended on the label.  Clean all areas the individual has used often Clean all touchable surfaces, such as counters, tabletops, doorknobs, bathroom fixtures, toilets, phones, keyboards, tablets, and bedside tables, every day. Also, clean any surfaces that may  have blood, body fluids, and/or secretions or excretions on them. Wear gloves when cleaning surfaces the patient has come in contact with. Use a diluted bleach solution (e.g., dilute bleach with 1 part bleach and 10 parts water) or a household disinfectant with a label that says EPA-registered for coronaviruses. To make a bleach solution at home, add 1 tablespoon of bleach to 1 quart (4 cups) of water. For a larger supply, add  cup of bleach to 1 gallon (16 cups) of water. Read labels of cleaning products and follow recommendations provided on product labels.  Labels contain instructions for safe and effective use of the cleaning product including precautions you should take when applying the product, such as wearing gloves or eye protection and making sure you have good ventilation during use of the product. Remove gloves and wash hands immediately after cleaning.  Monitor yourself for signs and symptoms of illness Caregivers and household members are considered close contacts, should monitor their health, and will be asked to limit movement outside of the home to the extent possible. Follow the monitoring steps for close contacts listed on the symptom monitoring form.   ? If you have additional questions, contact your local health department or call the epidemiologist on call at [phone] (available 24/7). ? This guidance is subject to change. For the most up-to-date guidance from [facility], please refer to their website: [email or web address]   Increase activity slowly   Complete by: As directed      Allergies as of 01/24/2020      Reactions   Sulfa Antibiotics Anaphylaxis, Swelling   64 years old, Hospitalized for 2 weeks, unknown      Medication List    STOP taking these medications   diphenhydrAMINE 25 MG tablet Commonly known as: BENADRYL   predniSONE 20 MG tablet Commonly known as: DELTASONE Replaced by: predniSONE 10 MG (21)  Tbpk tablet     TAKE these medications   acetaminophen 325 MG tablet Commonly known as: TYLENOL Take 2 tablets (650 mg total) by mouth every 6 (six) hours as needed for mild pain (or Fever >/= 101).   aspirin EC 81 MG tablet Take 81 mg by mouth daily.   chlorpheniramine-HYDROcodone 10-8 MG/5ML Suer Commonly known as: TUSSIONEX Take 5 mLs by mouth every 12 (twelve) hours as needed for cough.   co-enzyme Q-10 30 MG capsule Take 30 mg by mouth daily.   guaiFENesin-dextromethorphan 100-10 MG/5ML syrup Commonly known as: ROBITUSSIN DM Take 10 mLs by mouth every 4 (four) hours as needed for cough.   Ipratropium-Albuterol 20-100 MCG/ACT Aers respimat Commonly known as: COMBIVENT Inhale 1 puff into the lungs every 6 (six) hours.   lisinopril 20 MG tablet Commonly known as: ZESTRIL Take 20 mg by mouth daily.   multivitamin with minerals Tabs tablet Take 1 tablet by mouth daily.   predniSONE 10 MG (21) Tbpk tablet Commonly known as: STERAPRED UNI-PAK 21 TAB Take 6 tablets on day 1, 5 tablets on day 2, 4 tablets on day 3, 3 tablets on day 4, 2 tablets on day 5, 1 tablet on day 6 and then stop on day 7 Replaces: predniSONE 20 MG tablet   rosuvastatin 20 MG tablet Commonly known as: CRESTOR Take 20 mg by mouth daily.   vitamin C 500 MG tablet Commonly known as: ASCORBIC ACID Take 500 mg by mouth daily.   zinc sulfate 220 (50 Zn) MG capsule Take 1 capsule (220 mg total) by mouth daily. Start taking on: January 25, 2020            Durable Medical Equipment  (From admission, onward)         Start     Ordered   01/24/20 1150  DME Oxygen  Once       Question Answer Comment  Length of Need 6 Months   Mode or (Route) Nasal cannula   Liters per Minute 2   Oxygen delivery system Gas      01/24/20 1152          Allergies  Allergen Reactions  . Sulfa Antibiotics  Anaphylaxis and Swelling    64 years old, Hospitalized for 2 weeks, unknown     Consultations:  None  Procedures/Studies: DG CHEST PORT 1 VIEW  Result Date: 01/24/2020 CLINICAL DATA:  Shortness of breath EXAM: PORTABLE CHEST 1 VIEW COMPARISON:  January 17, 2020 FINDINGS: There has been considerable clearing of airspace opacity bilaterally compared to the previous study. A small amount of patchy infiltrate remains in the left base as well as atelectatic change in the left mid lung and left base regions. Right lung is currently clear. Heart is upper normal in size with pulmonary vascularity normal. No adenopathy. No bone lesions. IMPRESSION: Significant clearing of airspace opacity bilaterally compared to recent study. Patchy infiltrate remains left base. Atelectasis left mid lung and left base present also. Right lung clear. Stable cardiac silhouette. Electronically Signed   By: Lowella Grip III M.D.   On: 01/24/2020 06:59   DG Chest Port 1 View  Result Date: 01/17/2020 CLINICAL DATA:  COVID positive cough EXAM: PORTABLE CHEST 1 VIEW COMPARISON:  01/28/2017 FINDINGS: Patchy bilateral largely peripheral and left basilar consolidations and ground-glass densities. No pleural effusion. Borderline cardiomegaly. No pneumothorax. IMPRESSION: Patchy bilateral peripheral and left basilar consolidations and ground-glass densities consistent with bilateral pneumonia and history of COVID positivity. Electronically Signed   By: Donavan Foil M.D.   On: 01/17/2020 20:20     Subjective: Seen and examined at bedside he is doing better today and weaned to 2 L.  Feels okay and still has some shortness of breath but states he is doing better than he did since coming in.  Denies any lightheadedness or dizziness.  No other concerns up at this time inflammatory markers are trending down.  Patient stable to be discharged home and he understands agrees with plan of care and he will follow with PCP as well as pulmonary outpatient setting.  Discharge Exam: Vitals:   01/24/20 0507 01/24/20 1253   BP: (!) 141/96 (!) 135/80  Pulse: 71 74  Resp: 20 20  Temp: 98 F (36.7 C) 98.2 F (36.8 C)  SpO2: 94% 93%   Vitals:   01/23/20 1324 01/23/20 1817 01/24/20 0507 01/24/20 1253  BP: (!) 135/80  (!) 141/96 (!) 135/80  Pulse: 85  71 74  Resp: 18  20 20   Temp: 98 F (36.7 C)  98 F (36.7 C) 98.2 F (36.8 C)  TempSrc: Oral  Oral Oral  SpO2: 95% 95% 94% 93%  Weight:      Height:       General: Pt is alert, awake, not in acute distress Cardiovascular: RRR, S1/S2 +, no rubs, no gallops Respiratory: Diminished bilaterally with coarse breath sounds and some mild crackles, no wheezing, no rhonchi; wearing supplemental oxygen via nasal cannula 2 L Abdominal: Soft, NT, distended secondary body habitus, bowel sounds + Extremities: Minimal edema, no cyanosis  The results of significant diagnostics from this hospitalization (including imaging, microbiology, ancillary and laboratory) are listed below for reference.    Microbiology: Recent Results (from the past 240 hour(s))  Blood Culture (routine x 2)     Status: None   Collection Time: 01/17/20  9:19 PM   Specimen: BLOOD RIGHT FOREARM  Result Value Ref Range Status   Specimen Description   Final    BLOOD RIGHT FOREARM Performed at [facility], [address], [place], [place] [address]    Special Requests   Final    BOTTLES DRAWN AEROBIC AND ANAEROBIC Blood Culture results may not be  optimal due to an inadequate volume of blood received in culture bottles Performed at [facility] [address], [place], [place] [address]    Culture   Final    NO GROWTH 5 DAYS Performed at [facility], [place] [address] [place], [place] [address]    Report Status 01/22/2020 FINAL  Final  Blood Culture (routine x 2)     Status: None   Collection Time: 01/17/20  9:19 PM   Specimen: BLOOD  Result Value Ref Range Status   Specimen Description   Final    BLOOD LEFT ANTECUBITAL Performed at [facility] [address], [place], [place] [address]    Special Requests   Final    BOTTLES DRAWN AEROBIC AND ANAEROBIC Blood Culture adequate volume Performed at [facility] [address] [place], [place] [address]    Culture   Final    NO GROWTH 5 DAYS Performed at [facility], [place] [address] [place], [place] [address]    Report Status 01/22/2020 FINAL  Final    Labs: BNP (last 3 results) No results for input(s): BNP in the last 8760 hours. Basic Metabolic Panel: Recent Labs  Lab 01/18/20 1416 01/19/20 0533 01/20/20 0437 01/21/20 0500 01/22/20 0459 01/23/20 1031 01/24/20 0439  NA 140   < > 141 140 140 139 137  K 3.7   < > 3.5 3.8 4.7 4.8 4.9  CL 99   < > 101 99 100 97* 99  CO2 27   < > 27 28 28 29 26   GLUCOSE 202*   < > 160* 147* 145* 158* 145*  BUN 25*   < > 29* 24* 21 21 21   CREATININE 0.70   < > 0.77 0.72 0.68 0.67 0.62  CALCIUM 8.7*   < > 8.8* 8.5* 8.6* 8.9 8.8*  MG 3.1*  --   --   --   --  2.6* 2.4  PHOS 3.8  --   --   --   --  3.4 4.2   < > = values in this interval not displayed.   Liver Function Tests: Recent Labs  Lab 01/20/20 0437 01/21/20 0500 01/22/20 0459 01/23/20 1031 01/24/20 0439  AST 62* 51* 23 20 22   ALT 117* 127* 90* 69* 63*  ALKPHOS 38 36* 37* 37* 37*  BILITOT 1.0 1.0 0.6 0.8 0.7  PROT 6.8 6.6 6.5 6.4* 6.7  ALBUMIN 3.1* 3.0* 3.0* 3.1* 3.2*   No results for input(s): LIPASE, AMYLASE in the last 168 hours. No results for input(s): AMMONIA in the last 168 hours. CBC: Recent Labs  Lab 01/17/20 2119 01/18/20 1416 01/19/20 0533 01/23/20 1031 01/24/20 0439  WBC 7.7 4.9 9.1 16.8* 15.6*  NEUTROABS  --  3.8 6.8 13.9* 13.1*  HGB 14.6 13.9 14.0 13.9 14.2  HCT 43.6 40.9 43.2 43.0 42.7  MCV 88.3 88.0 90.9 92.5 89.3  PLT 214 228 279 371 387   Cardiac Enzymes: No results for input(s): CKTOTAL, CKMB, CKMBINDEX, TROPONINI in the last 168 hours. BNP: Invalid input(s): POCBNP CBG: Recent Labs  Lab  01/23/20 1202 01/23/20 1655 01/23/20 2044 01/24/20 0841 01/24/20 1114  GLUCAP 117* 153* 129* 105* 154*   D-Dimer Recent Labs    01/22/20 0459 01/24/20 0439  DDIMER 0.85* 0.72*   Hgb A1c No results for input(s): HGBA1C in the last 72 hours. Lipid Profile No results for input(s): CHOL, HDL, LDLCALC, TRIG, CHOLHDL, LDLDIRECT in the last 72  hours. Thyroid function studies No results for input(s): TSH, T4TOTAL, T3FREE, THYROIDAB in the last 72 hours.  Invalid input(s): FREET3 Anemia work up Recent Labs    01/24/20 0439  FERRITIN 626*   Urinalysis    Component Value Date/Time   COLORURINE AMBER (A) 12/15/2016 1342   APPEARANCEUR HAZY (A) 12/15/2016 1342   LABSPEC 1.027 12/15/2016 1342   PHURINE 6.0 12/15/2016 1342   GLUCOSEU NEGATIVE 12/15/2016 1342   [place] 12/15/2016 1342   [place] 12/15/2016 1342   [place] 12/15/2016 1342   PROTEINUR 30 (A) 12/15/2016 1342   UROBILINOGEN 0.2 06/14/2007 1305   NITRITE NEGATIVE 12/15/2016 1342   LEUKOCYTESUR NEGATIVE 12/15/2016 1342   Sepsis Labs Invalid input(s): PROCALCITONIN,  WBC,  LACTICIDVEN Microbiology Recent Results (from the past 240 hour(s))  Blood Culture (routine x 2)     Status: None   Collection Time: 01/17/20  9:19 PM   Specimen: BLOOD RIGHT FOREARM  Result Value Ref Range Status   Specimen Description   Final    BLOOD RIGHT FOREARM Performed at [facility], [place] [address] [place], [place] [address]    Special Requests   Final    BOTTLES DRAWN AEROBIC AND ANAEROBIC Blood Culture results may not be optimal due to an inadequate volume of blood received in culture bottles Performed at [facility] [address] [place], [place] [address]    Culture   Final    NO GROWTH 5 DAYS Performed at [facility], [place] [address], [place], [place] [address]    Report Status 01/22/2020 FINAL  Final  Blood Culture (routine x 2)     Status: None   Collection  Time: 01/17/20  9:19 PM   Specimen: BLOOD  Result Value Ref Range Status   Specimen Description   Final    BLOOD LEFT ANTECUBITAL Performed at [facility] [address], [place], [place] [address]    Special Requests   Final    BOTTLES DRAWN AEROBIC AND ANAEROBIC Blood Culture adequate volume Performed at [facility] [address] [place], [place] [address]    Culture   Final    NO GROWTH 5 DAYS Performed at [facility], [place] [address], [place],  [address]    Report Status 01/22/2020 FINAL  Final   Time coordinating discharge: 35 minutes  SIGNED:  Kerney Elbe, DO [facility] 01/24/2020, 7:48 PM Pager is on [place]  If 7PM-7AM, please contact night-coverage [email or web address]

## 2020-01-24 NOTE — Progress Notes (Signed)
SATURATION QUALIFICATIONS: (This note is used to comply with regulatory documentation for home oxygen)  Patient Saturations on Room Air at Rest = 93%  Patient Saturations on Room Air while Ambulating = 86%  Patient Saturations on 2 Liters of oxygen while Ambulating = 93%  Please briefly explain why patient needs home oxygen: to maintain 02 sats of 93 and above

## 2020-02-12 ENCOUNTER — Ambulatory Visit (INDEPENDENT_AMBULATORY_CARE_PROVIDER_SITE_OTHER): Payer: 59 | Admitting: Pulmonary Disease

## 2020-02-12 ENCOUNTER — Encounter: Payer: Self-pay | Admitting: Pulmonary Disease

## 2020-02-12 ENCOUNTER — Other Ambulatory Visit: Payer: Self-pay

## 2020-02-12 VITALS — BP 138/82 | HR 104 | Temp 96.6°F | Ht 71.0 in | Wt 234.4 lb

## 2020-02-12 DIAGNOSIS — Z9981 Dependence on supplemental oxygen: Secondary | ICD-10-CM

## 2020-02-12 DIAGNOSIS — J1282 Pneumonia due to coronavirus disease 2019: Secondary | ICD-10-CM | POA: Diagnosis not present

## 2020-02-12 DIAGNOSIS — J069 Acute upper respiratory infection, unspecified: Secondary | ICD-10-CM | POA: Diagnosis not present

## 2020-02-12 DIAGNOSIS — U071 COVID-19: Secondary | ICD-10-CM

## 2020-02-12 NOTE — Progress Notes (Signed)
Patient ID: Lawrence Henderson, male    DOB: [birth year], 64 y.o.   MRN: [id number]  Chief Complaint  Patient presents with  . Follow-up    hospital f/u, sob with exertion, using OTC cough meds to control cough, using O2 2L [place]    Referring provider: Mayra Neer, MD  HPI: Lawrence Henderson is a 64 year old male with history of obesity and hypertension who is referred to pulmonary clinic for hospital follow up after Covid 19 infection.   He was admitted 01/17/20 - 01/24/20 requiring supplemental oxygen via nasal canula. He had desaturations with ambulation and was discharged on 2L home O2. He has been checking his oxygen levels at home and reports he is around 94% at rest without oxygen and he checked it after taking his trash out walking about 60 ft plus 13 steps and his O2 saturation was around 92-93%.  He notices shortness of breath on exertion. He denies cough and possibly occasional wheezing. He has been using combivent 3 times daily and using an incentive spirometer.   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 His mother had lung cancer and was a heavy smoker.    Reviewed chest radiograph on 01/24/20 compared to 01/17/20 and there was slight improvement in the bilateral patchy opacities. No pleural effusions.  TEST/EVENTS :  Patient walked about 178m and O2 sats remained in the 92 and above today in clinic on room air.   Allergies  Allergen Reactions  . Sulfa Antibiotics Anaphylaxis and Swelling    64 years old, Hospitalized for 2 weeks, unknown     There is no immunization history on file for this patient.  Past Medical History:  Diagnosis Date  . Hyperlipidemia   . Hypertension     Tobacco History: Social History   Tobacco Use  Smoking Status Never Smoker  Smokeless Tobacco Never Used   Counseling given: Not Answered   Outpatient Medications Prior to Visit  Medication Sig Dispense Refill  . acetaminophen (TYLENOL) 325 MG tablet Take 2 tablets  (650 mg total) by mouth every 6 (six) hours as needed for mild pain (or Fever >/= 101). 20 tablet 0  . aspirin EC 81 MG tablet Take 81 mg by mouth daily.    . chlorpheniramine-HYDROcodone (TUSSIONEX) 10-8 MG/5ML SUER Take 5 mLs by mouth every 12 (twelve) hours as needed for cough. (Patient taking differently: Take 5 mLs by mouth every 12 (twelve) hours as needed for cough. Using at bedtime) 70 mL 0  . co-enzyme Q-10 30 MG capsule Take 30 mg by mouth daily.     Marland Kitchen guaiFENesin-dextromethorphan (ROBITUSSIN DM) 100-10 MG/5ML syrup Take 10 mLs by mouth every 4 (four) hours as needed for cough. 118 mL 0  . Ipratropium-Albuterol (COMBIVENT) 20-100 MCG/ACT AERS respimat Inhale 1 puff into the lungs every 6 (six) hours. 4 g 0  . lisinopril (PRINIVIL,ZESTRIL) 20 MG tablet Take 20 mg by mouth daily.    . Multiple Vitamin (MULTIVITAMIN WITH MINERALS) TABS tablet Take 1 tablet by mouth daily.    . rosuvastatin (CRESTOR) 20 MG tablet Take 20 mg by mouth daily.    . vitamin C (ASCORBIC ACID) 500 MG tablet Take 500 mg by mouth daily.    Marland Kitchen zinc sulfate 220 (50 Zn) MG capsule Take 1 capsule (220 mg total) by mouth daily. 14 capsule 0  . predniSONE (STERAPRED UNI-PAK 21 TAB) 10 MG (21) TBPK tablet Take 6 tablets on day 1, 5 tablets  on day 2, 4 tablets on day 3, 3 tablets on day 4, 2 tablets on day 5, 1 tablet on day 6 and then stop on day 7 21 tablet 0   No facility-administered medications prior to visit.     Review of Systems:   Constitutional:   No  weight loss, night sweats,  Fevers, chills, fatigue, or  lassitude.  HEENT:   No headaches,  Difficulty swallowing,  Tooth/dental problems, or  Sore throat,                No sneezing, itching, ear ache, nasal congestion, post nasal drip,   CV:  No chest pain,  Orthopnea, PND, swelling in lower extremities, anasarca, dizziness, palpitations, syncope.   GI  No heartburn, indigestion, abdominal pain, nausea, vomiting, diarrhea, change in bowel habits, loss of  appetite, bloody stools.   Resp: +shortness of breath with exertion.  No excess mucus, no productive cough,  No non-productive cough,  No coughing up of blood.  No change in color of mucus.  No wheezing.  No chest wall deformity  Skin: no rash or lesions.  GU: no dysuria, change in color of urine, no urgency or frequency.  No flank pain, no hematuria   MS:  No joint pain or swelling.  No decreased range of motion.  No back pain.    Physical Exam  BP 138/82 (BP Location: Left Arm, Cuff Size: Normal)   Pulse (!) 104   Temp (!) 96.6 F (35.9 C) (Temporal)   Ht 5\' 11"  (1.803 m)   Wt 234 lb 6.4 oz (106.3 kg)   SpO2 97%   BMI 32.69 kg/m   GEN: A/Ox3; pleasant , NAD, well nourished    HEENT:  [place]/AT,  EACs-clear, TMs-wnl, NOSE-clear, THROAT-clear, no lesions, no postnasal drip or exudate noted.   NECK:  Supple w/ fair ROM; no JVD; normal carotid impulses w/o bruits; no thyromegaly or nodules palpated; no lymphadenopathy.    RESP  Clear  P & A; w/o, wheezes/ rales/ or rhonchi. no accessory muscle use, no dullness to percussion  CARD:  RRR, no m/r/g, no peripheral edema, pulses intact, no cyanosis or clubbing.  GI:   Soft & nt; nml bowel sounds; no organomegaly or masses detected.   Musco: Warm bil, no deformities or joint swelling noted.   Neuro: alert, no focal deficits noted.    Skin: Warm, no lesions or rashes    Lab Results:  CBC    Component Value Date/Time   WBC 15.6 (H) 01/24/2020 0439   RBC 4.78 01/24/2020 0439   HGB 14.2 01/24/2020 0439   HCT 42.7 01/24/2020 0439   PLT 387 01/24/2020 0439   MCV 89.3 01/24/2020 0439   MCH 29.7 01/24/2020 0439   MCHC 33.3 01/24/2020 0439   RDW 12.9 01/24/2020 0439   LYMPHSABS 1.0 01/24/2020 0439   MONOABS 0.5 01/24/2020 0439   EOSABS 0.0 01/24/2020 0439   BASOSABS 0.1 01/24/2020 0439    BMET    Component Value Date/Time   NA 137 01/24/2020 0439   K 4.9 01/24/2020 0439   CL 99 01/24/2020 0439   CO2 26 01/24/2020 0439    GLUCOSE 145 (H) 01/24/2020 0439   BUN 21 01/24/2020 0439   CREATININE 0.62 01/24/2020 0439   CALCIUM 8.8 (L) 01/24/2020 0439   GFRNONAA >60 01/24/2020 0439   GFRAA >60 01/24/2020 0439    BNP No results found for: BNP  ProBNP No results found for: PROBNP  Imaging: DG CHEST  PORT 1 VIEW  Result Date: 01/24/2020 CLINICAL DATA:  Shortness of breath EXAM: PORTABLE CHEST 1 VIEW COMPARISON:  January 17, 2020 FINDINGS: There has been considerable clearing of airspace opacity bilaterally compared to the previous study. A small amount of patchy infiltrate remains in the left base as well as atelectatic change in the left mid lung and left base regions. Right lung is currently clear. Heart is upper normal in size with pulmonary vascularity normal. No adenopathy. No bone lesions. IMPRESSION: Significant clearing of airspace opacity bilaterally compared to recent study. Patchy infiltrate remains left base. Atelectasis left mid lung and left base present also. Right lung clear. Stable cardiac silhouette. Electronically Signed   By: Lowella Grip III M.D.   On: 01/24/2020 06:59   DG Chest Port 1 View  Result Date: 01/17/2020 CLINICAL DATA:  COVID positive cough EXAM: PORTABLE CHEST 1 VIEW COMPARISON:  01/28/2017 FINDINGS: Patchy bilateral largely peripheral and left basilar consolidations and ground-glass densities. No pleural effusion. Borderline cardiomegaly. No pneumothorax. IMPRESSION: Patchy bilateral peripheral and left basilar consolidations and ground-glass densities consistent with bilateral pneumonia and history of COVID positivity. Electronically Signed   By: Donavan Foil M.D.   On: 01/17/2020 20:20      Assessment & Plan:  Lawrence Henderson is a 64 year old male with history of obesity and hypertension who is referred to pulmonary clinic for hospital follow up after Covid 19 infection. He was admitted to the hospital 01/17/20 - 01/24/20. He was discharged with 2L home O2.   Today in clinic  he was able to ambulate on room air for 100 meters or more while maintaining oxygen saturations around 93%. Based on this information, he is safe to stop using his supplemental oxygen. He is safe to resume physical activity at his discretion. If he continues to build his strength and feel stronger by next week, then he is safe to get the first dose of his covid vaccine series.   He will follow up in 3 months time. At that time we will determine follow up chest imaging and consider PFTs based on his clinical status.  He can continue combivent inhaler 3 times per day and then begin to use it as needed in 2-3 weeks. He is to continue the use of incentive spirometry.    Freddi Starr, MD 02/12/2020

## 2020-02-12 NOTE — Patient Instructions (Signed)
You are safe to stop supplemental oxygen based on your walk test in clinic today Continue to use the incentive spirometry 2-3 times per day You are safe to advance your physical activity as able Continue combivent inhaler You are safe to get the Covid 19 Vaccine in 1-2 weeks as long as your physical strength is improving. Follow up in 3 months

## 2020-05-19 ENCOUNTER — Ambulatory Visit (INDEPENDENT_AMBULATORY_CARE_PROVIDER_SITE_OTHER): Payer: 59 | Admitting: Pulmonary Disease

## 2020-05-19 ENCOUNTER — Other Ambulatory Visit: Payer: Self-pay

## 2020-05-19 ENCOUNTER — Encounter: Payer: Self-pay | Admitting: Pulmonary Disease

## 2020-05-19 VITALS — BP 140/84 | HR 84 | Temp 98.7°F | Ht 71.0 in | Wt 249.6 lb

## 2020-05-19 DIAGNOSIS — J1282 Pneumonia due to coronavirus disease 2019: Secondary | ICD-10-CM

## 2020-05-19 DIAGNOSIS — R0600 Dyspnea, unspecified: Secondary | ICD-10-CM

## 2020-05-19 DIAGNOSIS — R0609 Other forms of dyspnea: Secondary | ICD-10-CM

## 2020-05-19 DIAGNOSIS — U071 COVID-19: Secondary | ICD-10-CM

## 2020-05-19 MED ORDER — ANORO ELLIPTA 62.5-25 MCG/INH IN AEPB
1.0000 | INHALATION_SPRAY | Freq: Every day | RESPIRATORY_TRACT | 0 refills | Status: DC
Start: 2020-05-19 — End: 2021-01-13

## 2020-05-19 NOTE — Patient Instructions (Addendum)
Continue on combivent inhaler as needed.  Try anoro ellipta inhaler when done with combivent. Call if you are noticing benefit and we will call in a prescription.  Work on weight loss with a goal weight of 225lbs  We will check pulmonary function tests at the next visit in 3 months

## 2020-05-19 NOTE — Addendum Note (Signed)
Addended byLuanna Salk on: 05/19/2020 02:43 PM   Modules accepted: Orders

## 2020-05-19 NOTE — Progress Notes (Signed)
Synopsis: Return visit for Covid 19 Pneumonia  Subjective:   PATIENT ID: Lawrence Henderson GENDER: male DOB: [birth year], MRN: [id number]   HPI  Chief Complaint  Patient presents with  . Follow-up    Still some SOB with activity   Lawrence Henderson is a 64 year old male with history of obesity and hypertension who returns to pulmonary clinic for follow up after Covid 19 infection.   He was admitted 01/17/20 - 01/24/20 requiring supplemental oxygen via nasal canula. He had desaturations with ambulation and was discharged on 2L home O2. His supplemental oxygen therapy was discontinued at the last visit (02/12/20) based on ambulatory O2 monitoring in clinic.   He continues to experience shortness of breath with walking to the end of his driveway and back. He also reports dyspnea with working around the house that requires breaks. He feels the improvement in his breathing has plateaud. He has gained 25lbs back that he lost while he was in the hospitalization.   He has been using combivent 3 times daily and notices benefit in his breathing.  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   He reports snoring. He sleeps mainly on his side. He denies nighttime awakenings. He feels rested in the morning and denies fatigue or Henderson time sleepiness.  Past Medical History:  Diagnosis Date  . Hyperlipidemia   . Hypertension      Family History  Problem Relation Age of Onset  . Lung cancer Mother   . Heart attack Father   . Diabetes Mellitus II Paternal Grandfather      Social History   Socioeconomic History  . Marital status: Married    Spouse name: Not on file  . Number of children: Not on file  . Years of education: Not on file  . Highest education level: Not on file  Occupational History  . Not on file  Tobacco Use  . Smoking status: Never Smoker  . Smokeless tobacco: Never Used  Substance and Sexual Activity  . Alcohol use: Yes  . Drug use: No  .  Sexual activity: Not on file  Other Topics Concern  . Not on file  Social History Narrative  . Not on file   Social Determinants of Health   Financial Resource Strain:   . Difficulty of Paying Living Expenses: Not on file  Food Insecurity:   . Worried About Charity fundraiser in the Last Year: Not on file  . Ran Out of Food in the Last Year: Not on file  Transportation Needs:   . Lack of Transportation (Medical): Not on file  . Lack of Transportation (Non-Medical): Not on file  Physical Activity:   . Days of Exercise per Week: Not on file  . Minutes of Exercise per Session: Not on file  Stress:   . Feeling of Stress : Not on file  Social Connections:   . Frequency of Communication with Friends and Family: Not on file  . Frequency of Social Gatherings with Friends and Family: Not on file  . Attends Religious Services: Not on file  . Active Member of Clubs or Organizations: Not on file  . Attends Archivist Meetings: Not on file  . Marital Status: Not on file  Intimate Partner Violence:   . Fear of Current or Ex-Partner: Not on file  . Emotionally Abused: Not on file  . Physically Abused: Not on file  . Sexually Abused: Not on  file     Allergies  Allergen Reactions  . Sulfa Antibiotics Anaphylaxis and Swelling    64 years old, Hospitalized for 2 weeks, unknown     Outpatient Medications Prior to Visit  Medication Sig Dispense Refill  . aspirin EC 81 MG tablet Take 81 mg by mouth daily.    Marland Kitchen co-enzyme Q-10 30 MG capsule Take 30 mg by mouth daily.     . Ipratropium-Albuterol (COMBIVENT) 20-100 MCG/ACT AERS respimat Inhale 1 puff into the lungs every 6 (six) hours. 4 g 0  . lisinopril (PRINIVIL,ZESTRIL) 20 MG tablet Take 20 mg by mouth daily.    . Multiple Vitamin (MULTIVITAMIN WITH MINERALS) TABS tablet Take 1 tablet by mouth daily.    . rosuvastatin (CRESTOR) 20 MG tablet Take 20 mg by mouth daily.    . chlorpheniramine-HYDROcodone (TUSSIONEX) 10-8 MG/5ML SUER  Take 5 mLs by mouth every 12 (twelve) hours as needed for cough. (Patient taking differently: Take 5 mLs by mouth every 12 (twelve) hours as needed for cough. Using at bedtime) 70 mL 0  . vitamin C (ASCORBIC ACID) 500 MG tablet Take 500 mg by mouth daily.    Marland Kitchen zinc sulfate 220 (50 Zn) MG capsule Take 1 capsule (220 mg total) by mouth daily. 14 capsule 0  . acetaminophen (TYLENOL) 325 MG tablet Take 2 tablets (650 mg total) by mouth every 6 (six) hours as needed for mild pain (or Fever >/= 101). (Patient not taking: Reported on 05/19/2020) 20 tablet 0  . guaiFENesin-dextromethorphan (ROBITUSSIN DM) 100-10 MG/5ML syrup Take 10 mLs by mouth every 4 (four) hours as needed for cough. (Patient not taking: Reported on 05/19/2020) 118 mL 0   No facility-administered medications prior to visit.    Review of Systems  Constitutional: Negative for chills, diaphoresis, fever, malaise/fatigue and weight loss.  HENT: Negative for congestion and sore throat.   Eyes: Negative.   Respiratory: Positive for shortness of breath. Negative for cough, hemoptysis, sputum production and wheezing.   Cardiovascular: Negative for chest pain, orthopnea, leg swelling and PND.  Gastrointestinal: Negative for heartburn.  Genitourinary: Negative.   Musculoskeletal: Negative.    Objective:   Vitals:   05/19/20 1339  BP: 140/84  Pulse: 84  Temp: 98.7 F (37.1 C)  TempSrc: Oral  SpO2: 98%  Weight: 249 lb 9.6 oz (113.2 kg)  Height: 5\' 11"  (1.803 m)     Physical Exam Constitutional:      General: He is not in acute distress.    Appearance: Normal appearance. He is obese. He is not ill-appearing.  HENT:     Head: Normocephalic and atraumatic.  Eyes:     General: No scleral icterus. Cardiovascular:     Rate and Rhythm: Normal rate and regular rhythm.     Pulses: Normal pulses.     Heart sounds: Normal heart sounds. No murmur heard.   Pulmonary:     Effort: Pulmonary effort is normal.     Breath sounds:  Normal breath sounds. No wheezing, rhonchi or rales.  Musculoskeletal:     Right lower leg: No edema.     Left lower leg: No edema.  Skin:    General: Skin is warm and dry.     Capillary Refill: Capillary refill takes less than 2 seconds.  Neurological:     Mental Status: He is alert and oriented to person, place, and time.  Psychiatric:        Mood and Affect: Mood normal.  Behavior: Behavior normal.        Thought Content: Thought content normal.        Judgment: Judgment normal.     CBC    Component Value Date/Time   WBC 15.6 (H) 01/24/2020 0439   RBC 4.78 01/24/2020 0439   HGB 14.2 01/24/2020 0439   HCT 42.7 01/24/2020 0439   PLT 387 01/24/2020 0439   MCV 89.3 01/24/2020 0439   MCH 29.7 01/24/2020 0439   MCHC 33.3 01/24/2020 0439   RDW 12.9 01/24/2020 0439   LYMPHSABS 1.0 01/24/2020 0439   MONOABS 0.5 01/24/2020 0439   EOSABS 0.0 01/24/2020 0439   BASOSABS 0.1 01/24/2020 0439    Chest imaging: CXR 01/24/20 There has been considerable clearing of airspace opacity bilaterally compared to the previous study. A small amount of patchy infiltrate remains in the left base as well as atelectatic change in the left mid lung and left base regions. Right lung is currently clear. Heart is upper normal in size with pulmonary vascularity normal. No adenopathy. No bone lesions.  PFT: No flowsheet data found.  Echo: 2018 Left ventricle: The cavity size was normal. Systolic function was  normal. The estimated ejection fraction was in the range of 55%  to 60%. Wall motion was normal; there were no regional wall  motion abnormalities. Left ventricular diastolic function  parameters were normal.  NM Myocardial Stress Test 01/29/2017  Blood pressure demonstrated a hypertensive response to exercise.  There was no ST segment deviation noted during stress.  The study is normal.  This is a low risk study.  Nuclear stress EF: 56%.  Assessment & Plan:    Pneumonia due to COVID-19 virus  Discussion: Lawrence Henderson is a 64 year old male with history of obesity and hypertension who returns to pulmonary clinic for Covid 19 infection. He was admitted to the hospital 01/17/20 - 01/24/20. He was discharged with 2L home O2 which was then discontinued on 02/12/20.  He continues to experience exertional dyspnea. He is overall feeling much better but feels his improvement has reached a plateau.   He is to continue combivent inhaler and we have provided him with a sample of anoro to try. If he receives benefit then he is to call us so we can send in a prescription for him.    He has received the first shot of the covid 19 vaccine series and will be finishing the series in December.  He will follow up in 3 months time. At that time we will obtain PFTs.  Freda Jackson, MD [facility] Office: [phone]   See Amion for Pager Details      Current Outpatient Medications:  .  aspirin EC 81 MG tablet, Take 81 mg by mouth daily., Disp: , Rfl:  .  co-enzyme Q-10 30 MG capsule, Take 30 mg by mouth daily. , Disp: , Rfl:  .  Ipratropium-Albuterol (COMBIVENT) 20-100 MCG/ACT AERS respimat, Inhale 1 puff into the lungs every 6 (six) hours., Disp: 4 g, Rfl: 0 .  lisinopril (PRINIVIL,ZESTRIL) 20 MG tablet, Take 20 mg by mouth daily., Disp: , Rfl:  .  Multiple Vitamin (MULTIVITAMIN WITH MINERALS) TABS tablet, Take 1 tablet by mouth daily., Disp: , Rfl:  .  rosuvastatin (CRESTOR) 20 MG tablet, Take 20 mg by mouth daily., Disp: , Rfl:

## 2020-08-12 ENCOUNTER — Other Ambulatory Visit (HOSPITAL_COMMUNITY)
Admission: RE | Admit: 2020-08-12 | Discharge: 2020-08-12 | Disposition: A | Discharge status: Home / Self Care | Payer: 59 | Source: Ambulatory Visit | Attending: Pulmonary Disease | Admitting: Pulmonary Disease

## 2020-08-12 DIAGNOSIS — Z20822 Contact with and (suspected) exposure to covid-19: Secondary | ICD-10-CM | POA: Insufficient documentation

## 2020-08-12 LAB — SARS CORONAVIRUS 2 (TAT 6-24 HRS): SARS Coronavirus 2: NEGATIVE

## 2020-08-15 ENCOUNTER — Other Ambulatory Visit: Payer: Self-pay

## 2020-08-15 ENCOUNTER — Ambulatory Visit (INDEPENDENT_AMBULATORY_CARE_PROVIDER_SITE_OTHER): Payer: 59 | Admitting: Pulmonary Disease

## 2020-08-15 ENCOUNTER — Encounter: Payer: Self-pay | Admitting: Pulmonary Disease

## 2020-08-15 VITALS — BP 132/80 | HR 80 | Temp 97.3°F | Ht 72.0 in | Wt 254.4 lb

## 2020-08-15 DIAGNOSIS — U071 COVID-19: Secondary | ICD-10-CM | POA: Diagnosis not present

## 2020-08-15 DIAGNOSIS — R0602 Shortness of breath: Secondary | ICD-10-CM | POA: Diagnosis not present

## 2020-08-15 DIAGNOSIS — R0609 Other forms of dyspnea: Secondary | ICD-10-CM

## 2020-08-15 DIAGNOSIS — R0683 Snoring: Secondary | ICD-10-CM | POA: Diagnosis not present

## 2020-08-15 DIAGNOSIS — R0600 Dyspnea, unspecified: Secondary | ICD-10-CM | POA: Diagnosis not present

## 2020-08-15 DIAGNOSIS — J1282 Pneumonia due to coronavirus disease 2019: Secondary | ICD-10-CM | POA: Diagnosis not present

## 2020-08-15 LAB — PULMONARY FUNCTION TEST
DL/VA % pred: 124 %
DL/VA: 5.11 ml/min/mmHg/L
DLCO cor % pred: 107 %
DLCO cor: 30.73 ml/min/mmHg
DLCO unc % pred: 107 %
DLCO unc: 30.73 ml/min/mmHg
FEF 25-75 Post: 4.12 L/sec
FEF 25-75 Pre: 3.21 L/sec
FEF2575-%Change-Post: 28 %
FEF2575-%Pred-Post: 138 %
FEF2575-%Pred-Pre: 108 %
FEV1-%Change-Post: 4 %
FEV1-%Pred-Post: 93 %
FEV1-%Pred-Pre: 89 %
FEV1-Post: 3.48 L
FEV1-Pre: 3.34 L
FEV1FVC-%Change-Post: 5 %
FEV1FVC-%Pred-Pre: 109 %
FEV6-%Change-Post: -1 %
FEV6-%Pred-Post: 85 %
FEV6-%Pred-Pre: 86 %
FEV6-Post: 4.05 L
FEV6-Pre: 4.09 L
FEV6FVC-%Change-Post: 0 %
FEV6FVC-%Pred-Post: 105 %
FEV6FVC-%Pred-Pre: 104 %
FVC-%Change-Post: -1 %
FVC-%Pred-Post: 81 %
FVC-%Pred-Pre: 82 %
FVC-Post: 4.05 L
FVC-Pre: 4.09 L
Post FEV1/FVC ratio: 86 %
Post FEV6/FVC ratio: 100 %
Pre FEV1/FVC ratio: 82 %
Pre FEV6/FVC Ratio: 100 %
RV % pred: 86 %
RV: 2.11 L
TLC % pred: 83 %
TLC: 6.2 L

## 2020-08-15 NOTE — Progress Notes (Signed)
Synopsis: Return visit for Covid 19 Pneumonia  Subjective:   PATIENT ID: Lawrence Henderson GENDER: male DOB: 19-Mar-1956, MRN: [id number]   HPI  Chief Complaint  Patient presents with  . Follow-up    Patient still feels like he gets out of breath real easy with exertion. Some dry cough in the morning. PFT today   Rolin Schult is a 65 year old male with history of obesity and hypertension who returns to pulmonary clinic for follow up after Covid 19 infection.   He was admitted 01/17/20 - 01/24/20 requiring supplemental oxygen via nasal canula. He had desaturations with ambulation and was discharged on 2L home O2. His supplemental oxygen therapy was discontinued at a previous visit (02/12/20) based on ambulatory O2 monitoring in clinic.   He continues to experience shortness of breath with walking to the end of his driveway and back. He also reports dyspnea with working around the house that requires breaks. He has not noticed any change in his breathing since last visit. He has gained 20lbs since the visit in August. He does report some lower extremity edema.  He was provided a sample of anoro at the last visit as he has been using combivent with continued relief from it, but he never tried the sample and continued using combivent.   He reports snoring. He sleeps mainly on his side. He denies nighttime awakenings.  PFTs today are within normal limits.  Past Medical History:  Diagnosis Date  . Hyperlipidemia   . Hypertension      Family History  Problem Relation Age of Onset  . Lung cancer Mother   . Heart attack Father   . Diabetes Mellitus II Paternal Grandfather      Social History   Socioeconomic History  . Marital status: Married    Spouse name: Not on file  . Number of children: Not on file  . Years of education: Not on file  . Highest education level: Not on file  Occupational History  . Not on file  Tobacco Use  . Smoking status: Former Smoker    Packs/day:  0.25    Types: Cigarettes    Quit date: 1975    Years since quitting: 47.1  . Smokeless tobacco: Never Used  . Tobacco comment: smoked for about a month or two  Vaping Use  . Vaping Use: Never used  Substance and Sexual Activity  . Alcohol use: Yes  . Drug use: No    Comment: Quit when he was 52  . Sexual activity: Not on file  Other Topics Concern  . Not on file  Social History Narrative  . Not on file   Social Determinants of Health   Financial Resource Strain: Not on file  Food Insecurity: Not on file  Transportation Needs: Not on file  Physical Activity: Not on file  Stress: Not on file  Social Connections: Not on file  Intimate Partner Violence: Not on file     Allergies  Allergen Reactions  . Sulfa Antibiotics Anaphylaxis and Swelling    65 years old, Hospitalized for 2 weeks, unknown     Outpatient Medications Prior to Visit  Medication Sig Dispense Refill  . aspirin EC 81 MG tablet Take 81 mg by mouth daily.    Marland Kitchen co-enzyme Q-10 30 MG capsule Take 30 mg by mouth daily.     . Ipratropium-Albuterol (COMBIVENT) 20-100 MCG/ACT AERS respimat Inhale 1 puff into the lungs every 6 (six) hours. 4 g 0  .  lisinopril (PRINIVIL,ZESTRIL) 20 MG tablet Take 20 mg by mouth daily.    . Multiple Vitamin (MULTIVITAMIN WITH MINERALS) TABS tablet Take 1 tablet by mouth daily.    . rosuvastatin (CRESTOR) 20 MG tablet Take 20 mg by mouth daily.    Marland Kitchen umeclidinium-vilanterol (ANORO ELLIPTA) 62.5-25 MCG/INH AEPB Inhale 1 puff into the lungs daily. 14 each 0   No facility-administered medications prior to visit.    Review of Systems  Constitutional: Negative for chills, diaphoresis, fever, malaise/fatigue and weight loss.  HENT: Negative for congestion and sore throat.   Eyes: Negative.   Respiratory: Positive for shortness of breath. Negative for cough, hemoptysis, sputum production and wheezing.   Cardiovascular: Negative for chest pain, orthopnea, leg swelling and PND.   Gastrointestinal: Negative for heartburn.  Genitourinary: Negative.   Musculoskeletal: Negative.    Objective:   Vitals:   08/15/20 1103  BP: 132/80  Pulse: 80  Temp: (!) 97.3 F (36.3 C)  TempSrc: Temporal  SpO2: 96%  Weight: 254 lb 6.4 oz (115.4 kg)  Height: 6' (1.829 m)    Physical Exam Constitutional:      General: He is not in acute distress.    Appearance: Normal appearance. He is obese. He is not ill-appearing.  HENT:     Head: Normocephalic and atraumatic.  Eyes:     General: No scleral icterus. Cardiovascular:     Rate and Rhythm: Normal rate and regular rhythm.     Pulses: Normal pulses.     Heart sounds: Normal heart sounds. No murmur heard.   Pulmonary:     Effort: Pulmonary effort is normal.     Breath sounds: Normal breath sounds. No wheezing, rhonchi or rales.  Musculoskeletal:     Right lower leg: No edema.     Left lower leg: No edema.  Skin:    General: Skin is warm and dry.     Capillary Refill: Capillary refill takes less than 2 seconds.  Neurological:     Mental Status: He is alert and oriented to person, place, and time.  Psychiatric:        Mood and Affect: Mood normal.        Behavior: Behavior normal.        Thought Content: Thought content normal.        Judgment: Judgment normal.     CBC    Component Value Date/Time   WBC 15.6 (H) 01/24/2020 0439   RBC 4.78 01/24/2020 0439   HGB 14.2 01/24/2020 0439   HCT 42.7 01/24/2020 0439   PLT 387 01/24/2020 0439   MCV 89.3 01/24/2020 0439   MCH 29.7 01/24/2020 0439   MCHC 33.3 01/24/2020 0439   RDW 12.9 01/24/2020 0439   LYMPHSABS 1.0 01/24/2020 0439   MONOABS 0.5 01/24/2020 0439   EOSABS 0.0 01/24/2020 0439   BASOSABS 0.1 01/24/2020 0439   BMP Latest Ref Rng & Units 01/24/2020 01/23/2020 01/22/2020  Glucose 70 - 99 mg/dL 145(H) 158(H) 145(H)  BUN 8 - 23 mg/dL 21 21 21   Creatinine 0.61 - 1.24 mg/dL 0.62 0.67 0.68  Sodium 135 - 145 mmol/L 137 139 140  Potassium 3.5 - 5.1 mmol/L  4.9 4.8 4.7  Chloride 98 - 111 mmol/L 99 97(L) 100  CO2 22 - 32 mmol/L 26 29 28   Calcium 8.9 - 10.3 mg/dL 8.8(L) 8.9 8.6(L)    Chest imaging: CXR 01/24/20 There has been considerable clearing of airspace opacity bilaterally compared to the previous study. A small amount of  patchy infiltrate remains in the left base as well as atelectatic change in the left mid lung and left base regions. Right lung is currently clear. Heart is upper normal in size with pulmonary vascularity normal. No adenopathy. No bone lesions.  PFT: PFT Results Latest Ref Rng & Units 08/15/2020  FVC-Pre L 4.09  FVC-Predicted Pre % 82  FVC-Post L 4.05  FVC-Predicted Post % 81  Pre FEV1/FVC % % 82  Post FEV1/FCV % % 86  FEV1-Pre L 3.34  FEV1-Predicted Pre % 89  FEV1-Post L 3.48  DLCO uncorrected ml/min/mmHg 30.73  DLCO UNC% % 107  DLCO corrected ml/min/mmHg 30.73  DLCO COR %Predicted % 107  DLVA Predicted % 124  TLC L 6.20  TLC % Predicted % 83  RV % Predicted % 86    Echo: 2018 Left ventricle: The cavity size was normal. Systolic function was  normal. The estimated ejection fraction was in the range of 55%  to 60%. Wall motion was normal; there were no regional wall  motion abnormalities. Left ventricular diastolic function  parameters were normal.  NM Myocardial Stress Test 01/29/2017  Blood pressure demonstrated a hypertensive response to exercise.  There was no ST segment deviation noted during stress.  The study is normal.  This is a low risk study.  Nuclear stress EF: 56%.  Assessment & Plan:   Pneumonia due to COVID-19 virus  Shortness of breath  Discussion: Lawrence Henderson is a 65 year old male with history of obesity and hypertension who returns to pulmonary clinic for Covid 19 infection. He was admitted to the hospital 01/17/20 - 01/24/20. He was discharged with 2L home O2 which was then discontinued on 02/12/20.  He continues to experience exertional dyspnea. His pulmonary  function tests are reassuring today as they are within normal limits. He continues to have symptom relief with the combivent. Encouraged him to use the sample of anoro given to him at last visit as this will be an easier regimen than using combivent multiple times per day.   Based on his history of snoring and obesity he is at risk for sleep apnea but reports he would not be amenable to wearing CPAP if needed so we will hold off on testing at this time.   Encouraged him to work on weight loss and increasing his physical activity level on a gradual basis.   We will check an echo for his exertional dyspnea and clinical history of lower extremity edema.   He will follow up in 6 months time.  Freda Jackson, MD [facility] Office: [phone]   See Amion for Pager Details      Current Outpatient Medications:  .  aspirin EC 81 MG tablet, Take 81 mg by mouth daily., Disp: , Rfl:  .  co-enzyme Q-10 30 MG capsule, Take 30 mg by mouth daily. , Disp: , Rfl:  .  Ipratropium-Albuterol (COMBIVENT) 20-100 MCG/ACT AERS respimat, Inhale 1 puff into the lungs every 6 (six) hours., Disp: 4 g, Rfl: 0 .  lisinopril (PRINIVIL,ZESTRIL) 20 MG tablet, Take 20 mg by mouth daily., Disp: , Rfl:  .  Multiple Vitamin (MULTIVITAMIN WITH MINERALS) TABS tablet, Take 1 tablet by mouth daily., Disp: , Rfl:  .  rosuvastatin (CRESTOR) 20 MG tablet, Take 20 mg by mouth daily., Disp: , Rfl:  .  umeclidinium-vilanterol (ANORO ELLIPTA) 62.5-25 MCG/INH AEPB, Inhale 1 puff into the lungs daily., Disp: 14 each, Rfl: 0

## 2020-08-15 NOTE — Patient Instructions (Addendum)
Start sample of anoro ellipta 1 puff daily and call us if you notice benefit   We will schedule you for an ECHO, ultrasound of your heart for the shortness of breath.   Start a walking regimen throughout the week. Start walking 5-10 minutes per day and add a couple minutes of time each week or two and work up to 25-30 minutes of walking per day.

## 2020-08-15 NOTE — Progress Notes (Signed)
PFT done today. 

## 2020-09-15 ENCOUNTER — Ambulatory Visit (HOSPITAL_COMMUNITY): Payer: 59 | Attending: Cardiovascular Disease

## 2020-09-15 ENCOUNTER — Other Ambulatory Visit: Payer: Self-pay

## 2020-09-15 DIAGNOSIS — R0602 Shortness of breath: Secondary | ICD-10-CM | POA: Diagnosis present

## 2020-09-15 LAB — ECHOCARDIOGRAM COMPLETE
Area-P 1/2: 4.49 cm2
S' Lateral: 2.9 cm

## 2020-09-18 ENCOUNTER — Encounter: Payer: Self-pay | Admitting: *Deleted

## 2020-09-22 ENCOUNTER — Telehealth: Payer: Self-pay | Admitting: Pulmonary Disease

## 2020-09-22 NOTE — Telephone Encounter (Signed)
Called and provided results of echo per Dr. Erin Fulling.  Patient/wife wanted the results e-mailed to them, advised that all the results are in his mychart account, if it is not yet active, it can be activated using the code on his AVS.  He verbalized understanding.  Nothing further needed.

## 2020-10-13 ENCOUNTER — Telehealth: Payer: Self-pay | Admitting: Pulmonary Disease

## 2020-10-14 NOTE — Telephone Encounter (Signed)
Nothing noted in message. Will close encounter.  

## 2020-10-21 ENCOUNTER — Telehealth: Payer: Self-pay | Admitting: Pulmonary Disease

## 2020-10-21 NOTE — Telephone Encounter (Signed)
Patient was last seen 08/15/2020, not due to follow up until 01/2021. Dr. Erin Fulling please advise on what you would like to be included in a letter outlining pt's status. Thanks!

## 2020-10-21 NOTE — Telephone Encounter (Signed)
Per pt, his employer is needing an update on our letterhead stating pt's condition. Pt is on long term disability through pcp. Employer needs update from [place] because [employer] was the last one to see pt. Please advise [phone] Fax #- [phone] ext [phone](Dawn Terrall Laity) contact person

## 2020-10-21 NOTE — Telephone Encounter (Signed)
Per pt, his employer is needing an update on our letterhead stating pt's condition. Pt is on long term disability through pcp. Employer needs update from [place] because [employer] was the last one to see pt. Please advise [phone] ext [phone](Dawn Terrall Laity) contact person who can provide fax#

## 2020-10-22 NOTE — Telephone Encounter (Signed)
Will route to cherina who does Dr. Erin Fulling box.

## 2020-10-22 NOTE — Telephone Encounter (Signed)
Will forward to Cherina to follow up on.  

## 2020-10-22 NOTE — Telephone Encounter (Signed)
Pt calling to state that he only needs the form filled out and not the letter. Pt can be reached at [phone]

## 2020-10-23 NOTE — Telephone Encounter (Signed)
Rec'd disability forms from [facility] - called patient to advise of fee. Patients okay to proceed, mentions about additional form that was dropped off by him that needs to be completed. Per previous note pulled paperwork from Dr. August Albino mail and prepared both forms for Dr. August Albino signature when he returns to the office on 10/24/2020-pr

## 2020-10-27 DIAGNOSIS — Z0289 Encounter for other administrative examinations: Secondary | ICD-10-CM

## 2020-10-27 NOTE — Telephone Encounter (Signed)
Dr. Erin Fulling didn't sign one of the forms - will give to Dr. Erin Fulling when he is back in office on 11/03/2020 -pr

## 2020-10-31 NOTE — Telephone Encounter (Signed)
Paperwork was given to [place] last week.

## 2020-11-03 NOTE — Telephone Encounter (Signed)
Faxed disability paperwork to [employer] at [phone] and LOA paperwork to employer at [phone] - will call patient tomorrow to pay fee - pr

## 2020-11-04 NOTE — Telephone Encounter (Signed)
Called and informed patient $29 fee has been applied to his account and he can call back and over the phone or pay online -pr

## 2020-11-10 DIAGNOSIS — Z0289 Encounter for other administrative examinations: Secondary | ICD-10-CM

## 2021-01-13 ENCOUNTER — Emergency Department (HOSPITAL_COMMUNITY): Payer: 59

## 2021-01-13 ENCOUNTER — Encounter (HOSPITAL_COMMUNITY): Payer: Self-pay | Admitting: Student

## 2021-01-13 ENCOUNTER — Observation Stay (HOSPITAL_COMMUNITY)
Admission: EM | Admit: 2021-01-13 | Discharge: 2021-01-14 | Disposition: A | Discharge status: Home / Self Care | Payer: 59 | Attending: Emergency Medicine | Admitting: Emergency Medicine

## 2021-01-13 ENCOUNTER — Other Ambulatory Visit: Payer: Self-pay

## 2021-01-13 DIAGNOSIS — Z8616 Personal history of COVID-19: Secondary | ICD-10-CM | POA: Diagnosis not present

## 2021-01-13 DIAGNOSIS — I10 Essential (primary) hypertension: Secondary | ICD-10-CM | POA: Diagnosis not present

## 2021-01-13 DIAGNOSIS — R079 Chest pain, unspecified: Principal | ICD-10-CM | POA: Insufficient documentation

## 2021-01-13 DIAGNOSIS — Z7982 Long term (current) use of aspirin: Secondary | ICD-10-CM | POA: Diagnosis not present

## 2021-01-13 DIAGNOSIS — R072 Precordial pain: Secondary | ICD-10-CM | POA: Diagnosis not present

## 2021-01-13 DIAGNOSIS — Z79899 Other long term (current) drug therapy: Secondary | ICD-10-CM | POA: Diagnosis not present

## 2021-01-13 DIAGNOSIS — R7303 Prediabetes: Secondary | ICD-10-CM | POA: Diagnosis not present

## 2021-01-13 DIAGNOSIS — E785 Hyperlipidemia, unspecified: Secondary | ICD-10-CM | POA: Diagnosis present

## 2021-01-13 DIAGNOSIS — Z87891 Personal history of nicotine dependence: Secondary | ICD-10-CM | POA: Insufficient documentation

## 2021-01-13 DIAGNOSIS — Z20822 Contact with and (suspected) exposure to covid-19: Secondary | ICD-10-CM | POA: Insufficient documentation

## 2021-01-13 LAB — CBC
HCT: 44.7 % (ref 39.0–52.0)
Hemoglobin: 14.7 g/dL (ref 13.0–17.0)
MCH: 29.5 pg (ref 26.0–34.0)
MCHC: 32.9 g/dL (ref 30.0–36.0)
MCV: 89.8 fL (ref 80.0–100.0)
Platelets: 207 10*3/uL (ref 150–400)
RBC: 4.98 MIL/uL (ref 4.22–5.81)
RDW: 12.8 % (ref 11.5–15.5)
WBC: 7.2 10*3/uL (ref 4.0–10.5)
nRBC: 0 % (ref 0.0–0.2)

## 2021-01-13 LAB — BASIC METABOLIC PANEL
Anion gap: 8 (ref 5–15)
BUN: 9 mg/dL (ref 8–23)
CO2: 25 mmol/L (ref 22–32)
Calcium: 9.5 mg/dL (ref 8.9–10.3)
Chloride: 106 mmol/L (ref 98–111)
Creatinine, Ser: 0.79 mg/dL (ref 0.61–1.24)
GFR, Estimated: >60 mL/min (ref >60)
Glucose, Bld: 106 mg/dL — ABNORMAL HIGH (ref 70–99)
Potassium: 3.7 mmol/L (ref 3.5–5.1)
Sodium: 139 mmol/L (ref 135–145)

## 2021-01-13 LAB — RESP PANEL BY RT-PCR (FLU A&B, COVID) ARPGX2
Influenza A by PCR: NEGATIVE
Influenza B by PCR: NEGATIVE
SARS Coronavirus 2 by RT PCR: NEGATIVE

## 2021-01-13 LAB — TROPONIN I (HIGH SENSITIVITY)
Troponin I (High Sensitivity): 3 ng/L (ref <18)
Troponin I (High Sensitivity): 3 ng/L (ref <18)

## 2021-01-13 MED ORDER — ENOXAPARIN SODIUM 40 MG/0.4ML IJ SOSY
40.0000 mg | PREFILLED_SYRINGE | INTRAMUSCULAR | Status: DC
  Administered 2021-01-13: 40 mg via SUBCUTANEOUS
  Filled 2021-01-13: qty 0.4

## 2021-01-13 MED ORDER — ASPIRIN 81 MG PO CHEW
324.0000 mg | CHEWABLE_TABLET | Freq: Once | ORAL | Status: AC
  Administered 2021-01-13: 324 mg via ORAL
  Filled 2021-01-13: qty 4

## 2021-01-13 MED ORDER — METOPROLOL TARTRATE 25 MG PO TABS
25.0000 mg | ORAL_TABLET | Freq: Once | ORAL | Status: AC | PRN
  Administered 2021-01-14: 25 mg via ORAL
  Filled 2021-01-13: qty 1

## 2021-01-13 MED ORDER — ROSUVASTATIN CALCIUM 20 MG PO TABS
20.0000 mg | ORAL_TABLET | Freq: Every day | ORAL | Status: DC
  Administered 2021-01-14: 20 mg via ORAL
  Filled 2021-01-13: qty 1

## 2021-01-13 MED ORDER — NITROGLYCERIN 0.4 MG SL SUBL: 0.4000 mg | SUBLINGUAL_TABLET | SUBLINGUAL | Status: DC | PRN

## 2021-01-13 MED ORDER — ONDANSETRON HCL 4 MG/2ML IJ SOLN: 4.0000 mg | Freq: Four times a day (QID) | INTRAMUSCULAR | Status: DC | PRN

## 2021-01-13 MED ORDER — LISINOPRIL 20 MG PO TABS
20.0000 mg | ORAL_TABLET | Freq: Every day | ORAL | Status: DC
  Administered 2021-01-14: 20 mg via ORAL
  Filled 2021-01-13: qty 1

## 2021-01-13 MED ORDER — ACETAMINOPHEN 325 MG PO TABS: 650.0000 mg | ORAL_TABLET | ORAL | Status: DC | PRN

## 2021-01-13 MED ORDER — AMLODIPINE BESYLATE 5 MG PO TABS
5.0000 mg | ORAL_TABLET | Freq: Every day | ORAL | Status: DC
  Administered 2021-01-13 – 2021-01-14 (×2): 5 mg via ORAL
  Filled 2021-01-13 (×2): qty 1

## 2021-01-13 MED ORDER — METOPROLOL TARTRATE 25 MG PO TABS: 50.0000 mg | ORAL_TABLET | Freq: Once | ORAL | Status: DC

## 2021-01-13 MED ORDER — ASPIRIN EC 81 MG PO TBEC
81.0000 mg | DELAYED_RELEASE_TABLET | Freq: Every day | ORAL | Status: DC
  Administered 2021-01-13 – 2021-01-14 (×2): 81 mg via ORAL
  Filled 2021-01-13 (×2): qty 1

## 2021-01-13 NOTE — ED Triage Notes (Signed)
Pt arrives POV with c/o of CP which started 01/12/2021. Pain intermittent. Today at 0515 pt had tightness in chest, SOB and diaphoretic. Denies N/V. Tightness in triage rates 4/10. A/O X4

## 2021-01-13 NOTE — H&P (Addendum)
Cardiology H&P:   Patient ID: Lawrence Henderson MRN: [id number]; DOB: [birth year]  Admit date: 01/13/2021 Date of Consult: 01/13/2021  PCP:  Mayra Neer, MD   [facility] Providers Cardiologist:  None   {  Patient Profile:   Lawrence Henderson is a 65 y.o. male with a history of chest pain with low risk Myoview in 2011 and 2018, chronic dyspnea  since COVID pneumonia in 12/2019, hypertension, hyperlipidemia, and obesity who is being seen 01/13/2021 for the evaluation of chest pain at the request of Dr. Tyrone Nine.   History of Present Illness:   Lawrence Henderson is a 65 year old male with the above history. Patient has a history of chest pain. Treadmill Myoview in 2011 showed no evidence of ischemia or prior infarct. He was admitted in 01/2017 with chest pain felt to be atypical. Treadmill Myoview was again low risk with no evidence of ischemia. Echo showed LVEF of 55-60% with normal wall motion and normal diastolic function. We have not seen patient since that time. In fact, patient has never been seen by Cardiology in the outpatient setting.  Patient was admitted in 12/2019 with COVID pneumonia and was treated with Remdesivir, steroids, and breathing treatments. He was discharged on 2L of home O2 which was able to be discontinued 1 month later. He has had chronic dyspnea since that time and follows in Pulmonology's COVID clinic. Echo in 08/2020 showed LVEF 60-65% with normal wall motion and normal diastolic function. RV normal. Patient was last seen by Pulmonology in 07/2020 at which time he reported continued dyspnea with walking to the end of his driveway and back as well as working around the house but this was stable. He also reported 20 lb weight gain over the prior 6 months as well as some lower extremity edema. PFTs at that visit were normal.  Patient presented to the ED today for further evaluation of chest pain. Patient reports he was in his usual state of health until last night when he developed  chest pain/tightness across his whole chest that he ranks as a 5-6/10 on the pain scale. He would occasionally have associated brief pain in his right arm with this but no other radiation. He would also have intermittent 6-7/10 sharp pain that would only last for a short time. He also reports increased shortness of breath from baseline as well as some diaphoresis. Also notes a couple episodes of nausea but no vomiting. Nothing seems to back the pain better or worse. Not worse with exertion. He was able to go to sleep last night and woke up still having very mild pain but was feeling better. He went to work but then pain worsened so he decided to come to the ED. He states the pain has never fully resolved since onset but has waxed and waned. Otherwise, has been in his usual state of health. He has chronic dyspnea on exertion but this is stable. He notes occasional feeling like his heart is racing but not sensation of skipping or irregular beats. No lightheadedness, dizziness, or syncope. No orthopnea, PND, or lower extremity edema. No recent fevers or illness. No cough or nasal congestion. No abnormal bleeding in urine or stools.   In the ED, patient mildly hypertensive but vitals stable. EKG showed normal sinus rhythm, rate 74 bpm, with isolated T wave inversions lead III but this has been seen on prior tracings. High-sensitivity troponin negative x2. Chest x-ray showed no acute findings. WBC 7.2, Hgb 14.7, Plts 207. Na  139, K 3.7, Glucose 106, BUN 9, Cr 0.79.   At the time of this evaluation, patient resting comfortably. He reports continued mild chest aching that he ranks as a 4/10 but is not in any distress.  Patient reports he remote tobacco/marijuana use but quit both in his teens/20s. He does have a family history of heart disease. His father had a history of CAD s/p two heart attacks (the last one in his 36s was fatal). His mother has a history of atrial fibrillation.  Past Medical History:   Diagnosis Date   Hyperlipidemia    Hypertension     Past Surgical History:  Procedure Laterality Date   APPENDECTOMY     arm surgery     right arm 2006   TONSILLECTOMY       Home Medications:  Prior to Admission medications   Medication Sig Start Date End Date Taking? Authorizing Provider  aspirin EC 81 MG tablet Take 81 mg by mouth daily.    [provider]  co-enzyme Q-10 30 MG capsule Take 30 mg by mouth daily.     [provider]  Ipratropium-Albuterol (COMBIVENT) 20-100 MCG/ACT AERS respimat Inhale 1 puff into the lungs every 6 (six) hours. 01/24/20   Raiford Noble Latif, DO  lisinopril (PRINIVIL,ZESTRIL) 20 MG tablet Take 20 mg by mouth daily. 01/13/15   [provider]  Multiple Vitamin (MULTIVITAMIN WITH MINERALS) TABS tablet Take 1 tablet by mouth daily.    [provider]  rosuvastatin (CRESTOR) 20 MG tablet Take 20 mg by mouth daily.    [provider]  umeclidinium-vilanterol (ANORO ELLIPTA) 62.5-25 MCG/INH AEPB Inhale 1 puff into the lungs daily. 05/19/20   Freddi Starr, MD    Inpatient Medications: Scheduled Meds:   Continuous Infusions:  PRN Meds: nitroGLYCERIN  Allergies:    Allergies  Allergen Reactions   Sulfa Antibiotics Anaphylaxis and Swelling    65 years old, Hospitalized for 2 weeks, unknown    Social History:   Social History   Socioeconomic History   Marital status: Married    Spouse name: Not on file   Number of children: Not on file   Years of education: Not on file   Highest education level: Not on file  Occupational History   Not on file  Tobacco Use   Smoking status: Former    Packs/day: 0.25    Types: Cigarettes    Quit date: 11    Years since quitting: 47.5   Smokeless tobacco: Never   Tobacco comments:    smoked for about a month or two  Vaping Use   Vaping Use: Never used  Substance and Sexual Activity   Alcohol use: Yes   Drug use: No    Comment: Quit when he was 75    Sexual activity: Not on file  Other Topics Concern   Not on file  Social History Narrative   Not on file   Social Determinants of Health   Financial Resource Strain: Not on file  Food Insecurity: Not on file  Transportation Needs: Not on file  Physical Activity: Not on file  Stress: Not on file  Social Connections: Not on file  Intimate Partner Violence: Not on file    Family History:   Family History  Problem Relation Age of Onset   Lung cancer Mother    Atrial fibrillation Mother    Heart attack Father    Diabetes Mellitus II Paternal Grandfather    Diabetes Paternal Merchant [employer] officer  ROS:  Please see the history of present illness.  Review of Systems  Constitutional:  Positive for diaphoresis. Negative for chills and fever.  HENT:  Negative for congestion.   Respiratory:  Positive for shortness of breath. Negative for cough and sputum production.   Cardiovascular:  Positive for chest pain. Negative for leg swelling and PND.  Gastrointestinal:  Positive for nausea. Negative for blood in stool, melena and vomiting.  Genitourinary:  Negative for hematuria.  Musculoskeletal:  Negative for myalgias.  Neurological:  Negative for dizziness and loss of consciousness.  Endo/Heme/Allergies:  Does not bruise/bleed easily.  Psychiatric/Behavioral:  Substance abuse: remote tobacco/marijuana use.     Physical Exam/Data:   Vitals:   01/13/21 0939 01/13/21 1259 01/13/21 1445  BP:  (!) 150/94 (!) 153/96  Pulse:  67 66  Resp:  18 17  Temp:  97.9 F (36.6 C)   TempSrc:  Oral   SpO2:  99% 100%  Weight: 104.3 kg    Height: 6' (1.829 m)      Intake/Output Summary (Last 24 hours) at 01/13/2021 1723 Last data filed at 01/13/2021 1445 Gross per 24 hour  Intake 0 ml  Output 0 ml  Net 0 ml   Last 3 Weights 01/13/2021 08/15/2020 05/19/2020  Weight (lbs) 230 lb 254 lb 6.4 oz 249 lb 9.6 oz  Weight (kg) 104.327 kg 115.395 kg 113.218 kg     Body mass index is 31.19 kg/m.   General: 65 y.o. male resting comfortably in no acute distress. HEENT: Normocephalic and atraumatic. Sclera clear.  Neck: Supple. No carotid bruits. No JVD. Heart: RRR. Distinct S1 and S2. No murmurs, gallops, or rubs. Radial pulses 2+ and equal bilaterally. Lungs: No increased work of breathing. Clear to ausculation bilaterally. No wheezes, rhonchi, or rales.  Abdomen: Soft, non-distended, and non-tender to palpation. Bowel sounds present. Extremities: No lower extremity edema.    Skin: Warm and dry. Neuro: Alert and oriented x3. No focal deficits. Psych: Normal affect. Responds appropriately.  EKG:  The EKG was personally reviewed and demonstrates: Normal sinus rhythm, rate 74 bpm, with isolated T wave inversions lead III. No acute changes from prior tracings.  Telemetry:  Telemetry was personally reviewed and demonstrates:  Normal sinus rhythm with rates in the 70s.  Relevant CV Studies:  Myoview 01/29/2017: Blood pressure demonstrated a hypertensive response to exercise. There was no ST segment deviation noted during stress. The study is normal. This is a low risk study. Nuclear stress EF: 56%.   Low risk stress nuclear study with normal perfusion and normal left ventricular regional and global systolic function. _______________  Echocardiogram 09/15/2020: Impressions: 1. Left ventricular ejection fraction, by estimation, is 60 to 65%. The  left ventricle has normal function. The left ventricle has no regional  wall motion abnormalities. There is mild asymmetric left ventricular  hypertrophy of the basal and septal  segments. Left ventricular diastolic parameters were normal. The average  left ventricular global longitudinal strain is -22.0 %. The global  longitudinal strain is normal.   2. Right ventricular systolic function is normal. The right ventricular  size is normal.   3. Left atrial size was mildly dilated.   4. The mitral valve is normal in structure. Trivial  mitral valve  regurgitation. No evidence of mitral stenosis.   5. The aortic valve was not well visualized. Aortic valve regurgitation  is not visualized. No aortic stenosis is present.   6. The inferior vena cava is normal in size with greater than  50%  respiratory variability, suggesting right atrial pressure of 3 mmHg.   Comparison(s): 01/29/17 EF 55-60%.   Laboratory Data:  High Sensitivity Troponin:   Recent Labs  Lab 01/13/21 0946 01/13/21 1500  TROPONINIHS 3 3     Chemistry Recent Labs  Lab 01/13/21 0946  NA 139  K 3.7  CL 106  CO2 25  GLUCOSE 106*  BUN 9  CREATININE 0.79  CALCIUM 9.5  GFRNONAA >60  ANIONGAP 8    No results for input(s): PROT, ALBUMIN, AST, ALT, ALKPHOS, BILITOT in the last 168 hours. Hematology Recent Labs  Lab 01/13/21 0946  WBC 7.2  RBC 4.98  HGB 14.7  HCT 44.7  MCV 89.8  MCH 29.5  MCHC 32.9  RDW 12.8  PLT 207   BNPNo results for input(s): BNP, PROBNP in the last 168 hours.  DDimer No results for input(s): DDIMER in the last 168 hours.   Radiology/Studies:  DG Chest 2 View  Result Date: 01/13/2021 CLINICAL DATA:  Chest pain, shortness of breath EXAM: CHEST - 2 VIEW COMPARISON:  01/24/2020 FINDINGS: The heart size and mediastinal contours are within normal limits. Both lungs are clear. The visualized skeletal structures are unremarkable. IMPRESSION: No active cardiopulmonary disease. Electronically Signed   By: Kathreen Devoid   On: 01/13/2021 10:22     Assessment and Plan:   Chest Pain - Patient presented with chest pain that has been constant for over 24 hours now. - EKG shows no acute changes.  - High-sensitivity troponin negative x2. - Echo in 08/2020 showed normal LV function. - Currently reports mild chest aching but not in any distress.  - Continue aspirin and high-intensity statin.  - Given constant chest pain for >24 hours, negative enzymes, and unchanged EKG; do not think chest pain is due to significant CAD. However,  patient has multiple CV risk factors and family history. Will plan for coronary CTA tomorrow.   Hypertension - BP elevated with systolic BP as high as the 150s-160s. - Continue home Lisinopril 20mg  daily. - Will add Amlodipine 5mg  daily.  Hyperlipidemia - Continue home Crestor 20mg  daily.  - Will recheck fasting lipid panel in the morning.  History of COVID-19 Pneumonia - Admitted with COVID-19 pneumonia in 12/2019 and has had chronic dyspnea since that time. Followed in [facility] clinic. - Increase in dyspnea with chest pain yesterday but otherwise stable.  - Patient states he is no longer taking Combivent or Anoro Ellipta inhalers.    Risk Assessment/Risk Scores:   HEAR Score (for undifferentiated chest pain):  HEAR Score: 4{  Severity of Illness: The appropriate patient status for this patient is OBSERVATION. Observation status is judged to be reasonable and necessary in order to provide the required intensity of service to ensure the patient's safety. The patient's presenting symptoms, physical exam findings, and initial radiographic and laboratory data in the context of their medical condition is felt to place them at decreased risk for further clinical deterioration. Furthermore, it is anticipated that the patient will be medically stable for discharge from the hospital within 2 midnights of admission. The following factors support the patient status of observation.   " The patient's presenting symptoms include chest pain with associated shortness of breath, nausea, diaphoresis. " The physical exam findings as above. " The initial radiographic and laboratory data as above.  For questions or updates, please contact [place] Please consult [email or web address] for contact info under     Signed, Darreld Mclean, PA-C  01/13/2021 5:24  PM  ---------------------------------------------------------------------------------------------   History and all data above reviewed.   Patient examined.  I agree with the findings as above.  Lawrence Henderson is a 65 year old male with a history of chronic dyspnea since COVID-19 pneumonia in July 2021, hypertension, hyperlipidemia, obesity with recent 30 pound intentional weight loss due to modification of lifestyle factors and previous ischemic evaluations that have been normal in 2011 and 2018 by nuclear stress test who presents for further evaluation of chest pain.  He developed chest pain and tightness across his whole chest that was 6 out of 10 with some associated right arm pain and no other radiation.  He also had some increase in shortness of breath and diaphoresis along with nausea.  He has a family history of premature CAD with his father passing from an MI at age 81.  His blood pressure has been reasonably well controlled but he notes that it has been primarily in the 130s over 80s at home.  EKG relatively unremarkable for ischemic change, troponin 3x2.  Findings discussed with the patient, we reviewed that this is unlikely to represent ACS, but given presentation and risk factors and intermediate risk for CAD with his last ischemic evaluation 5 years ago, would be reasonable to repeat ischemic testing in the form of coronary CTA.  Constitutional: No acute distress Eyes: pupils equally round and reactive to light, sclera non-icteric, normal conjunctiva and lids ENMT: normal dentition, moist mucous membranes Cardiovascular: regular rhythm, normal rate, no murmurs. S1 and S2 normal. Radial pulses normal bilaterally. No jugular venous distention.  Respiratory: clear to auscultation bilaterally GI : normal bowel sounds, soft and nontender. No distention.   MSK: extremities warm, well perfused. No edema.  NEURO: grossly nonfocal exam, moves all extremities. PSYCH: alert and oriented x 3, normal mood and affect.   All available labs, radiology testing, previous records reviewed. Agree with documented assessment and plan of my  colleague as stated above with the following additions or changes:  Active Problems:   Chest pain    Plan: We will plan for coronary CTA as noted above tomorrow morning.  Heart rate is relatively well controlled he may only need a low-dose of beta-blockade, we will determine this in the morning.  He may also need some additional blood pressure control, he has been on a stable dose of lisinopril for quite some time, we will add amlodipine 5 mg daily and monitor for blood pressure response.  Discussed in detail dietary recommendations with the patient and his wife who also provides some of the history.  We reviewed cholesterol rich foods to eat in moderation or abstain entirely if able.  Questions about calcium intake also answered with regard to possible atherosclerotic calcifications that may be detected with CT.   Length of Stay:  LOS: 0 days   Elouise Munroe, MD [facility] 6:07 PM  01/13/2021

## 2021-01-13 NOTE — ED Provider Notes (Signed)
Emergency Medicine Provider Triage Evaluation Note  Lawrence Henderson , a 65 y.o. male  was evaluated in triage.  Pt complains of chest pain  Review of Systems  Positive: Chest tightness Negative: No cough  Physical Exam  Ht 6' (1.829 m)   Wt 104.3 kg   BMI 31.19 kg/m  Gen:   Awake, no distress   Resp:  Normal effort  MSK:   Moves extremities without difficulty  Other:    Medical Decision Making  Medically screening exam initiated at 10:00 AM.  Appropriate orders placed.  Trudee Grip was informed that the remainder of the evaluation will be completed by another provider, this initial triage assessment does not replace that evaluation, and the importance of remaining in the ED until their evaluation is complete.     Fransico Meadow, [place] 01/13/21 1002    Milton Ferguson, MD 01/15/21 1027

## 2021-01-13 NOTE — ED Provider Notes (Signed)
[facility] EMERGENCY DEPARTMENT Provider Note   CSN: [id number] Arrival date & time: 01/13/21  3329     History Chief Complaint  Patient presents with   Chest Pain    Lawrence Henderson is a 65 y.o. male presenting for evaluation CP.   Patient states yesterday at 5 PM he was sitting at work when he developed central chest pain.  Is a gradual onset.  He describes it as a constant tightness and dullness, rated 5-6 out of 10.  He occasionally has sharp pain.  He has associated shortness of breath and diaphoresis which has been constant since yesterday.  He woke up a few times during the night due to pain and shortness of breath.  He tried to go to work today, but continued to have pain, and thus came to the ER to be migrated.  He does have radiation to the right arm, no pain to the left shoulder or jaw.  No increased pain with inspiration or activity.  Patient has had issues with shortness of breath since having COVID in July 2021, was cleared to return to work a month ago.  He had a normal echo in March 2022.  He has never seen a cardiologist before.  He reports his dad had multiple heart attacks, second event was at age 53.  Patient smoked for 1 to 2 years in high school, but has not had any tobacco since then.  He drinks 1-2 beers a day.  He has a history of hypertension and hyperlipidemia for which he takes medication.  Denies calf pain or swelling.  No recent travel. Pt currently has pain.   Additional history obtained from chart review.  Patient with a history of hypertension, hyperlipidemia, prediabetes, obesity, transaminitis in the setting of COVID infection.  I reviewed recent echo from March 2022 which showed an EF of 60 to 65%. Pt reports a previous stress test, but this is not in the EMR.    HPI  HPI: A 66 year old patient with a history of hypertension and hypercholesterolemia presents for evaluation of chest pain. Initial onset of pain was more than 6 hours ago.  The patient's chest pain is described as heaviness/pressure/tightness, is sharp and is not worse with exertion. The patient reports some diaphoresis. The patient's chest pain is not middle- or left-sided, is not well-localized and does radiate to the arms/jaw/neck. The patient does not complain of nausea. The patient has no history of stroke, has no history of peripheral artery disease, has not smoked in the past 90 days, denies any history of treated diabetes, has no relevant family history of coronary artery disease (first degree relative at less than age 57) and does not have an elevated BMI (>=30).   Past Medical History:  Diagnosis Date   Hyperlipidemia    Hypertension     Patient Active Problem List   Diagnosis Date Noted   Pre-diabetes 01/24/2020   Acute respiratory disease due to COVID-19 virus 01/18/2020   Pneumonia due to COVID-19 virus 01/17/2020   Transaminitis 01/17/2020   Hyperglycemia 01/17/2020   Chest pain 01/28/2017   Hypertension 01/28/2017   Obesity 01/28/2017   Hyperlipidemia     Past Surgical History:  Procedure Laterality Date   APPENDECTOMY     arm surgery     right arm 2006   TONSILLECTOMY         Family History  Problem Relation Age of Onset   Lung cancer Mother    Atrial fibrillation Mother  Heart attack Father    Diabetes Mellitus II Paternal Grandfather    Diabetes Paternal Grandfather     Social History   Tobacco Use   Smoking status: Former    Packs/day: 0.25    Types: Cigarettes    Quit date: 1975    Years since quitting: 47.5   Smokeless tobacco: Never   Tobacco comments:    smoked for about a month or two  Vaping Use   Vaping Use: Never used  Substance Use Topics   Alcohol use: Yes   Drug use: No    Comment: Quit when he was 31    Home Medications Prior to Admission medications   Medication Sig Start Date End Date Taking? Authorizing Provider  aspirin EC 81 MG tablet Take 81 mg by mouth daily.   Yes [provider]  co-enzyme Q-10 30 MG capsule Take 30 mg by mouth daily.    Yes [provider]  lisinopril (PRINIVIL,ZESTRIL) 20 MG tablet Take 20 mg by mouth daily. 01/13/15  Yes [provider]  Multiple Vitamin (MULTIVITAMIN WITH MINERALS) TABS tablet Take 1 tablet by mouth daily.   Yes [provider]  rosuvastatin (CRESTOR) 20 MG tablet Take 20 mg by mouth daily.   Yes [provider]  Ipratropium-Albuterol (COMBIVENT) 20-100 MCG/ACT AERS respimat Inhale 1 puff into the lungs every 6 (six) hours. 01/24/20   Sheikh, Omair Latif, DO  umeclidinium-vilanterol (ANORO ELLIPTA) 62.5-25 MCG/INH AEPB Inhale 1 puff into the lungs daily. 05/19/20   Freddi Starr, MD    Allergies    Sulfa antibiotics  Review of Systems   Review of Systems  Constitutional:  Positive for diaphoresis.  Respiratory:  Positive for shortness of breath.   Cardiovascular:  Positive for chest pain.  All other systems reviewed and are negative.  Physical Exam Updated Vital Signs BP (!) 141/104   Pulse 65   Temp 97.9 F (36.6 C) (Oral)   Resp 15   Ht 6' (1.829 m)   Wt 104.3 kg   SpO2 100%   BMI 31.19 kg/m   Physical Exam Vitals and nursing note reviewed.  Constitutional:      General: He is not in acute distress.    Appearance: Normal appearance. He is obese. He is diaphoretic.     Comments: Nontoxic, but slightly sweaty  HENT:     Head: Normocephalic and atraumatic.  Eyes:     Conjunctiva/sclera: Conjunctivae normal.     Pupils: Pupils are equal, round, and reactive to light.  Cardiovascular:     Rate and Rhythm: Normal rate and regular rhythm.     Pulses: Normal pulses.  Pulmonary:     Effort: Pulmonary effort is normal. No respiratory distress.     Breath sounds: Normal breath sounds. No wheezing.     Comments: Speaking in full sentences.  Clear lung sounds in all fields. Abdominal:     General: There is no distension.     Palpations: Abdomen is soft. There  is no mass.     Tenderness: There is no abdominal tenderness. There is no guarding or rebound.  Musculoskeletal:        General: Normal range of motion.     Cervical back: Normal range of motion and neck supple.     Right lower leg: No edema.     Left lower leg: No edema.  Skin:    General: Skin is warm.     Capillary Refill: Capillary refill takes less than  2 seconds.  Neurological:     Mental Status: He is alert and oriented to person, place, and time.  Psychiatric:        Mood and Affect: Mood and affect normal.        Speech: Speech normal.        Behavior: Behavior normal.    ED Results / Procedures / Treatments   Labs (all labs ordered are listed, but only abnormal results are displayed) Labs Reviewed  BASIC METABOLIC PANEL - Abnormal; Notable for the following components:      Result Value   Glucose, Bld 106 (*)    All other components within normal limits  RESP PANEL BY RT-PCR (FLU A&B, COVID) ARPGX2  CBC  TROPONIN I (HIGH SENSITIVITY)  TROPONIN I (HIGH SENSITIVITY)    EKG EKG Interpretation  Date/Time:  Tuesday January 13 2021 09:27:33 EDT Ventricular Rate:  74 PR Interval:  196 QRS Duration: 92 QT Interval:  382 QTC Calculation: 424 R Axis:   2 Text Interpretation: Normal sinus rhythm Normal ECG No significant change since last tracing Confirmed by Deno Etienne [phone]) on 01/13/2021 3:39:59 PM  Radiology DG Chest 2 View  Result Date: 01/13/2021 CLINICAL DATA:  Chest pain, shortness of breath EXAM: CHEST - 2 VIEW COMPARISON:  01/24/2020 FINDINGS: The heart size and mediastinal contours are within normal limits. Both lungs are clear. The visualized skeletal structures are unremarkable. IMPRESSION: No active cardiopulmonary disease. Electronically Signed   By: Kathreen Devoid   On: 01/13/2021 10:22    Procedures Procedures   Medications Ordered in ED Medications  nitroGLYCERIN (NITROSTAT) SL tablet 0.4 mg (has no administration in time range)  amLODipine (NORVASC)  tablet 5 mg (has no administration in time range)  metoprolol tartrate (LOPRESSOR) tablet 25 mg (has no administration in time range)  aspirin chewable tablet 324 mg (324 mg Oral Given 01/13/21 1557)    ED Course  I have reviewed the triage vital signs and the nursing notes.  Pertinent labs & imaging results that were available during my care of the patient were reviewed by me and considered in my medical decision making (see chart for details).    MDM Rules/Calculators/A&P HEAR Score: 4                         Patient presenting for evaluation of chest pain.  He has associated shortness of breath diaphoresis.  On exam, patient appears nontoxic, he however he is slightly sweaty.  History is concerning for persistent tightness and pain.  He has multiple risk factors including obesity, hypertension, hyperlipidemia, and family history.  Heart pathway score of 5.  Initial labs obtained from triage interpreted by me, overall reassuring.  Troponin negative.  Electrolytes stable.  EKG is nonischemic.  Chest x-ray viewed and independently interpreted by me, no pneumonia pneumothorax, effusion.  Discussed findings with patient and wife. Will consult with cardiology due to concerning history.   Discussed with cardiology, they will evaluate the pt.   Cardiology evaluated the patient and plan for admission with a CTA coronary.   Final Clinical Impression(s) / ED Diagnoses Final diagnoses:  Chest pain, unspecified type    Rx / DC Orders ED Discharge Orders     None        Franchot Heidelberg, PA-C 01/13/21 [place], [place], DO 01/13/21 Vernelle Emerald

## 2021-01-13 NOTE — ED Notes (Signed)
Dr advised pt could eat/drink. Gave pt a [place] sandwich and he ambulated to the bathroom with no issues.

## 2021-01-13 NOTE — ED Notes (Signed)
Attempted to give reportx1 

## 2021-01-14 ENCOUNTER — Other Ambulatory Visit (HOSPITAL_COMMUNITY): Payer: Self-pay | Admitting: Emergency Medicine

## 2021-01-14 ENCOUNTER — Observation Stay (HOSPITAL_COMMUNITY): Payer: 59

## 2021-01-14 DIAGNOSIS — R079 Chest pain, unspecified: Secondary | ICD-10-CM | POA: Diagnosis not present

## 2021-01-14 DIAGNOSIS — R072 Precordial pain: Secondary | ICD-10-CM | POA: Diagnosis not present

## 2021-01-14 LAB — LIPID PANEL
Cholesterol: 149 mg/dL (ref 0–200)
HDL: 51 mg/dL (ref >40)
LDL Cholesterol: 63 mg/dL (ref 0–99)
Total CHOL/HDL Ratio: 2.9 RATIO
Triglycerides: 175 mg/dL — ABNORMAL HIGH (ref <150)
VLDL: 35 mg/dL (ref 0–40)

## 2021-01-14 LAB — HEMOGLOBIN A1C
Hgb A1c MFr Bld: 5.5 % (ref 4.8–5.6)
Mean Plasma Glucose: 111.15 mg/dL

## 2021-01-14 LAB — BASIC METABOLIC PANEL
Anion gap: 7 (ref 5–15)
BUN: 11 mg/dL (ref 8–23)
CO2: 29 mmol/L (ref 22–32)
Calcium: 9.2 mg/dL (ref 8.9–10.3)
Chloride: 103 mmol/L (ref 98–111)
Creatinine, Ser: 0.84 mg/dL (ref 0.61–1.24)
GFR, Estimated: >60 mL/min (ref >60)
Glucose, Bld: 97 mg/dL (ref 70–99)
Potassium: 3.9 mmol/L (ref 3.5–5.1)
Sodium: 139 mmol/L (ref 135–145)

## 2021-01-14 MED ORDER — IOHEXOL 350 MG/ML SOLN
95.0000 mL | Freq: Once | INTRAVENOUS | Status: AC | PRN
  Administered 2021-01-14: 95 mL via INTRAVENOUS

## 2021-01-14 MED ORDER — AMLODIPINE BESYLATE 5 MG PO TABS: 5.0000 mg | ORAL_TABLET | Freq: Every day | ORAL | 2 refills | Status: DC

## 2021-01-14 MED ORDER — NITROGLYCERIN 0.4 MG SL SUBL
SUBLINGUAL_TABLET | SUBLINGUAL | Status: AC
  Filled 2021-01-14: qty 2

## 2021-01-14 MED ORDER — METOPROLOL TARTRATE 25 MG PO TABS: 25.0000 mg | ORAL_TABLET | Freq: Once | ORAL | Status: DC | PRN

## 2021-01-14 NOTE — Progress Notes (Addendum)
Progress Note  Patient Name: Lawrence Henderson Date of Encounter: 01/14/2021  Primary Cardiologist: New to [facility]   Subjective   Doing well today. One brief episode of chest tightness overnight. No SL NTG needed. Plan for CTA today.   Inpatient Medications    Scheduled Meds:  amLODipine  5 mg Oral Daily   aspirin EC  81 mg Oral Daily   enoxaparin (LOVENOX) injection  40 mg Subcutaneous Q24H   lisinopril  20 mg Oral Daily   rosuvastatin  20 mg Oral Daily   Continuous Infusions:  PRN Meds: acetaminophen, metoprolol tartrate, nitroGLYCERIN, ondansetron (ZOFRAN) IV   Vital Signs    Vitals:   01/13/21 1930 01/13/21 2110 01/14/21 0004 01/14/21 0500  BP: (!) 149/78 (!) 159/91 139/86 121/63  Pulse: 76 73 66 72  Resp: (!) 23 20 19 15   Temp:  98 F (36.7 C) 98.1 F (36.7 C) 98.4 F (36.9 C)  TempSrc:  Oral Oral Oral  SpO2: 96% 97% 97% 97%  Weight:  105.4 kg    Height:  6' (1.829 m)      Intake/Output Summary (Last 24 hours) at 01/14/2021 0754 Last data filed at 01/13/2021 1445 Gross per 24 hour  Intake 0 ml  Output 0 ml  Net 0 ml   Filed Weights   01/13/21 0939 01/13/21 2110  Weight: 104.3 kg 105.4 kg    Physical Exam   General: Well developed, well nourished, NAD Neck: Negative for carotid bruits. No JVD Lungs:Clear to ausculation bilaterally. No wheezes, rales, or rhonchi. Breathing is unlabored. Cardiovascular: RRR with S1 S2. No murmurs Abdomen: Soft, non-tender, non-distended. No obvious abdominal masses. Extremities: No edema. Radial pulses 2+ bilaterally Neuro: Alert and oriented. No focal deficits. No facial asymmetry. MAE spontaneously. Psych: Responds to questions appropriately with normal affect.    Labs    Chemistry Recent Labs  Lab 01/13/21 0946 01/14/21 0203  NA 139 139  K 3.7 3.9  CL 106 103  CO2 25 29  GLUCOSE 106* 97  BUN 9 11  CREATININE 0.79 0.84  CALCIUM 9.5 9.2  GFRNONAA >60 >60  ANIONGAP 8 7     Hematology Recent Labs   Lab 01/13/21 0946  WBC 7.2  RBC 4.98  HGB 14.7  HCT 44.7  MCV 89.8  MCH 29.5  MCHC 32.9  RDW 12.8  PLT 207    Cardiac EnzymesNo results for input(s): TROPONINI in the last 168 hours. No results for input(s): TROPIPOC in the last 168 hours.   BNPNo results for input(s): BNP, PROBNP in the last 168 hours.   DDimer No results for input(s): DDIMER in the last 168 hours.   Radiology    DG Chest 2 View  Result Date: 01/13/2021 CLINICAL DATA:  Chest pain, shortness of breath EXAM: CHEST - 2 VIEW COMPARISON:  01/24/2020 FINDINGS: The heart size and mediastinal contours are within normal limits. Both lungs are clear. The visualized skeletal structures are unremarkable. IMPRESSION: No active cardiopulmonary disease. Electronically Signed   By: Kathreen Devoid   On: 01/13/2021 10:22    Telemetry    01/14/21 NSR with HR 60-70's - Personally Reviewed  ECG    No new tracing as of 01/14/21 - Personally Reviewed  Cardiac Studies   Myoview 01/29/2017: Blood pressure demonstrated a hypertensive response to exercise. There was no ST segment deviation noted during stress. The study is normal. This is a low risk study. Nuclear stress EF: 56%.   Low risk stress nuclear  study with normal perfusion and normal left ventricular regional and global systolic function. _______________   Echocardiogram 09/15/2020: Impressions: 1. Left ventricular ejection fraction, by estimation, is 60 to 65%. The  left ventricle has normal function. The left ventricle has no regional  wall motion abnormalities. There is mild asymmetric left ventricular  hypertrophy of the basal and septal  segments. Left ventricular diastolic parameters were normal. The average  left ventricular global longitudinal strain is -22.0 %. The global  longitudinal strain is normal.   2. Right ventricular systolic function is normal. The right ventricular  size is normal.   3. Left atrial size was mildly dilated.   4. The mitral valve  is normal in structure. Trivial mitral valve  regurgitation. No evidence of mitral stenosis.   5. The aortic valve was not well visualized. Aortic valve regurgitation  is not visualized. No aortic stenosis is present.   6. The inferior vena cava is normal in size with greater than 50%  respiratory variability, suggesting right atrial pressure of 3 mmHg.   Comparison(s): 01/29/17 EF 55-60%.   Patient Profile     65 y.o. male with a history of chest pain with low risk Myoview in 2011 and 2018, chronic dyspnea  since COVID pneumonia in 12/2019, hypertension, hyperlipidemia, and obesity who is being seen 01/13/2021 for the evaluation of chest pain at the request of Dr. Tyrone Nine.   Assessment & Plan    1. Chest Pain: -Pt presented with chest pain greater than 24H with neg HsT and no acute  change on EKG>>plan was to proceed with CTA for more definitive evaluation  -HRs in the mid to upper 60's>>>will give metoprolol 50mg  now for CTA today  -Has 18G IV  -Follow results   2. HTN: -Stable, 121/63>>139/86 -Continued on PTA lisinopril 20mg  QD  -Amlodipine 5mg  added to regimen yesterday>>>improved BP today   3. HLD: -Last LDL, 63 from labs today  -Triglycerides slightly elevated at 175 which appears to be improved from past labs -Continue PTA Crestor 20mg  QD    Signed, Kathyrn Drown NP-C [facility] Pager: [phone] 01/14/2021, 7:54 AM     For questions or updates, please contact   Please consult [email or web address] for contact info under Cardiology/STEMI.  Patient seen and examined with Tonny Branch NP-C.  Agree as above, with the following exceptions and changes as noted below. No significant CP overnight. Gen: NAD, CV: RRR, no murmurs, Lungs: clear, Abd: soft, Extrem: Warm, well perfused, no edema, Neuro/Psych: alert and oriented x 3, normal mood and affect. All available labs, radiology testing, previous records reviewed. Plan for CCTA today. If no obstructive CAD, can likely d/c home  today.  Elouise Munroe, MD 01/14/21 9:52 AM

## 2021-01-14 NOTE — Discharge Summary (Signed)
Discharge Summary    Patient ID: Lawrence Henderson MRN: [id number]; DOB: Jan 21, 1956  Admit date: 01/13/2021 Discharge date: 01/14/2021  PCP:  Mayra Neer, MD   [facility] Cardiologist:  Dr. Margaretann Loveless, MD     Discharge Diagnoses    Principal Problem:   Chest pain Active Problems:   Hypertension   Hyperlipidemia  Diagnostic Studies/Procedures    Coronary CTA 01/15/19:  A 120 kV prospective scan was triggered in the descending thoracic aorta at 111 HU's. Axial non-contrast 3 mm slices were carried out through the heart. The data set was analyzed on a dedicated work station and scored using the [place]. Gantry rotation speed was 250 msecs and collimation was .6 mm. Beta blockade and 0.8 mg of sl NTG was given. The 3D data set was reconstructed in 5% intervals of the 35-75 % of the R-R cycle. Diastolic phases were analyzed on a dedicated work station using MPR, MIP and VRT modes. The patient received of contrast.   FINDINGS: Quality: Very good, HR 62, attenuation artifact   Coronary calcium score: The patient's coronary artery calcium score is 30, which places the patient in the 40th percentile.   Coronary arteries: Normal coronary origins.  Right dominance.   Right Coronary Artery: Dominant. Minimal 1-24% mixed distal vessel stenosis (CADRADS1). Normal R-PDA and R-PLB branches.   Left Main Coronary Artery: Normal. Bifurcates into the LAD and LCx arteries.   Left Anterior Descending Coronary Artery: Anterior vessel which reaches the apex. There is a mild 25-49% eccentric non-calcified stenosis of the proximal vessel, just prior to a small D1 takeoff (CADRADS2). The D1 branch demonstrates minimal 1-24% proximal mixed stenoses (CADRADS1).   Left Circumflex Artery: Smaller AV groove vessel without disease. Larger high OM1 branch with minimal non-calcified proximal stenosis (1-24%, CADRADS1).   Aorta: Normal size, 31 mm at the mid ascending aorta  (level of the PA bifurcation) measured double oblique. No calcifications. No dissection.   Aortic Valve: Trileaflet.  No calcifications.   Other findings:   Normal pulmonary vein drainage into the left atrium.   Normal left atrial appendage without a thrombus.   Normal size of the pulmonary artery.   IMPRESSION: 1. 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   2. Coronary calcium score of 30. This was 40th percentile for age and sex matched control.   3. Normal coronary origin with right dominance.   4. Aggressive cardiovascular risk factor modification is recommended. _____________   History of Present Illness     Lawrence Henderson is a 65 y.o. male with a history of chest pain with low risk Myoview in 2011 and 2018, chronic dyspnea  since COVID pneumonia in 12/2019, hypertension, hyperlipidemia, and obesity who is being seen 01/13/2021 for the evaluation of chest pain at the request of Dr. Tyrone Nine.   Patient presented to the ED 01/13/21 for the evaluation of chest pain. Patient reported he was in his usual state of health until the evening prior to presentation when he developed chest pain/tightness across his whole chest that he ranks as a 5-6/10 on the pain scale. He would occasionally have associated brief pain in his right arm with this but no other radiation. He would also have intermittent 6-7/10 sharp pain that would only last for a short time. He also reports increased shortness of breath from baseline as well as some diaphoresis. Also notes a couple episodes of nausea but no vomiting. Nothing seems to back the pain better or worse. Not  worse with exertion. He was able to go to sleep last night and woke up still having very mild pain but was feeling better. He went to work but then pain worsened so he decided to come to the ED. He states the pain has never fully resolved since onset but has waxed and waned. Otherwise, had been in his usual state of health. He has chronic  dyspnea on exertion but this is stable.   In the ED, patient was mildly hypertensive but vitals stable. EKG showed normal sinus rhythm, rate 74 bpm, with isolated T wave inversions lead III but this has been seen on prior tracings. High-sensitivity troponin negative x2. Chest x-ray showed no acute findings. WBC 7.2, Hgb 14.7, Plts 207. Na 139, K 3.7, Glucose 106, BUN 9, Cr 0.79.    At the time of this evaluation, patient was resting comfortably. He reports continued mild chest aching that he ranks as a 4/10 but is not in any distress.   Patient reports he remote tobacco/marijuana use but quit both in his teens/20s. He does have a family history of heart disease. His father had a history of CAD s/p two heart attacks (the last one in his 38s was fatal). His mother has a history of atrial fibrillation.  Due to symptoms above, plan was to proceed with CTA 01/14/21.   Hospital Course     CTA performed today showed minimal non-obstructive mixed CAD with a coronary calcium score of 30 which was 40th percentile for age and sex matched control. There was normal coronary origin with right dominance. Plan was to continue with aggressive cardiovascular risk factor modification was recommended.   Medical issues during hospital course:  Chest Pain: -Pt presented with chest pain greater than 24H with neg HsT and no acute change on EKG>>plan was to proceed with CTA for more definitive evaluation -CTA results as above    HTN: -Stable, 121/63>>139/86 -Continued on PTA lisinopril 20mg  QD -Amlodipine 5mg  added to regimen 01/13/21>>may need up titration in the OP setting    HLD: -Last LDL, 63 from labs today -Triglycerides slightly elevated at 175 which appears to be improved from past labs -Continue PTA Crestor 20mg  QD   Consultants: None    The patient was seen and examined by Dr. Margaretann Loveless who feels that the patient   Did the patient have an acute coronary syndrome (MI, NSTEMI, STEMI, etc) this  admission?:  No                               Did the patient have a percutaneous coronary intervention (stent / angioplasty)?:  No.   ___________  Discharge Vitals Blood pressure 116/78, pulse 61, temperature 98.5 F (36.9 C), temperature source Oral, resp. rate 16, height 6' (1.829 m), weight 105.4 kg, SpO2 96 %.  Filed Weights   01/13/21 0939 01/13/21 2110  Weight: 104.3 kg 105.4 kg   Labs & Radiologic Studies    CBC Recent Labs    01/13/21 0946  WBC 7.2  HGB 14.7  HCT 44.7  MCV 89.8  PLT 742   Basic Metabolic Panel Recent Labs    01/13/21 0946 01/14/21 0203  NA 139 139  K 3.7 3.9  CL 106 103  CO2 25 29  GLUCOSE 106* 97  BUN 9 11  CREATININE 0.79 0.84  CALCIUM 9.5 9.2   Liver Function Tests No results for input(s): AST, ALT, ALKPHOS, BILITOT, PROT, ALBUMIN in the  last 72 hours. No results for input(s): LIPASE, AMYLASE in the last 72 hours. High Sensitivity Troponin:   Recent Labs  Lab 01/13/21 0946 01/13/21 1500  TROPONINIHS 3 3    BNP Invalid input(s): POCBNP D-Dimer No results for input(s): DDIMER in the last 72 hours. Hemoglobin A1C Recent Labs    01/14/21 0203  HGBA1C 5.5   Fasting Lipid Panel Recent Labs    01/14/21 0203  CHOL 149  HDL 51  LDLCALC 63  TRIG 175*  CHOLHDL 2.9   Thyroid Function Tests No results for input(s): TSH, T4TOTAL, T3FREE, THYROIDAB in the last 72 hours.  Invalid input(s): FREET3 _____________  DG Chest 2 View  Result Date: 01/13/2021 CLINICAL DATA:  Chest pain, shortness of breath EXAM: CHEST - 2 VIEW COMPARISON:  01/24/2020 FINDINGS: The heart size and mediastinal contours are within normal limits. Both lungs are clear. The visualized skeletal structures are unremarkable. IMPRESSION: No active cardiopulmonary disease. Electronically Signed   By: Kathreen Devoid   On: 01/13/2021 10:22   CT CORONARY MORPH W/CTA COR W/SCORE W/CA W/CM &/OR WO/CM  Result Date: 01/14/2021 HISTORY: 65 yo male with chest pain/anginal  equiv, ECGs and troponins normal EXAM: Cardiac/Coronary CTA TECHNIQUE: The patient was scanned on a [employer]. PROTOCOL: A 120 kV prospective scan was triggered in the descending thoracic aorta at 111 HU's. Axial non-contrast 3 mm slices were carried out through the heart. The data set was analyzed on a dedicated work station and scored using the [place]. Gantry rotation speed was 250 msecs and collimation was .6 mm. Beta blockade and 0.8 mg of sl NTG was given. The 3D data set was reconstructed in 5% intervals of the 35-75 % of the R-R cycle. Diastolic phases were analyzed on a dedicated work station using MPR, MIP and VRT modes. The patient received of contrast. FINDINGS: Quality: Very good, HR 62, attenuation artifact Coronary calcium score: The patient's coronary artery calcium score is 30, which places the patient in the 40th percentile. Coronary arteries: Normal coronary origins.  Right dominance. Right Coronary Artery: Dominant. Minimal 1-24% mixed distal vessel stenosis (CADRADS1). Normal R-PDA and R-PLB branches. Left Main Coronary Artery: Normal. Bifurcates into the LAD and LCx arteries. Left Anterior Descending Coronary Artery: Anterior vessel which reaches the apex. There is a mild 25-49% eccentric non-calcified stenosis of the proximal vessel, just prior to a small D1 takeoff (CADRADS2). The D1 branch demonstrates minimal 1-24% proximal mixed stenoses (CADRADS1). Left Circumflex Artery: Smaller AV groove vessel without disease. Larger high OM1 branch with minimal non-calcified proximal stenosis (1-24%, CADRADS1). Aorta: Normal size, 31 mm at the mid ascending aorta (level of the PA bifurcation) measured double oblique. No calcifications. No dissection. Aortic Valve: Trileaflet.  No calcifications. Other findings: Normal pulmonary vein drainage into the left atrium. Normal left atrial appendage without a thrombus. Normal size of the pulmonary artery. IMPRESSION: 1. Minimal to mild  non-obstructive mixed CAD, CADRADS = 2. 2. Coronary calcium score of 30. This was 40th percentile for age and sex matched control. 3. Normal coronary origin with right dominance. 4. Aggressive cardiovascular risk factor modification is recommended. Electronically Signed   By: Pixie Casino M.D.   On: 01/14/2021 11:12   Disposition   Pt is being discharged home today in good condition.  Follow-up Plans & Appointments    Follow-up Information     Elouise Munroe, MD Follow up on 02/17/2021.   Specialties: Cardiology, Radiology Why: 840am Contact information: [address]  [place] [address] [phone]                 Discharge Medications   Allergies as of 01/14/2021       Reactions   Sulfa Antibiotics Anaphylaxis, Swelling   65 years old, Hospitalized for 2 weeks, unknown        Medication List     TAKE these medications    amLODipine 5 MG tablet Commonly known as: NORVASC Take 1 tablet (5 mg total) by mouth daily. Start taking on: January 15, 2021   aspirin EC 81 MG tablet Take 81 mg by mouth daily.   co-enzyme Q-10 30 MG capsule Take 30 mg by mouth daily.   lisinopril 20 MG tablet Commonly known as: ZESTRIL Take 20 mg by mouth daily.   multivitamin with minerals Tabs tablet Take 1 tablet by mouth daily.   rosuvastatin 20 MG tablet Commonly known as: CRESTOR Take 20 mg by mouth daily.   vitamin C 500 MG tablet Commonly known as: ASCORBIC ACID Take 500 mg by mouth daily.   zinc gluconate 50 MG tablet Take 50 mg by mouth daily.        Outstanding Labs/Studies   None   Duration of Discharge Encounter   Greater than 30 minutes including physician time.  Signed, Kathyrn Drown, NP 01/14/2021, 12:43 PM

## 2021-01-26 ENCOUNTER — Telehealth: Payer: Self-pay | Admitting: Internal Medicine

## 2021-01-26 MED ORDER — AMLODIPINE BESYLATE 5 MG PO TABS: 5.0000 mg | ORAL_TABLET | Freq: Two times a day (BID) | ORAL | 3 refills | Status: DC

## 2021-01-26 NOTE — Telephone Encounter (Signed)
  Pt c/o BP issue: STAT if pt c/o blurred vision, one-sided weakness or slurred speech  1. What are your last 5 BP readings?  Friday 01/16/21 166/94, 166/89  2. Are you having any other symptoms (ex. Dizziness, headache, blurred vision, passed out)? no  3. What is your BP issue? High BP   Pt c/o medication issue:  1. Name of Medication: amLODipine (NORVASC) 5 MG tablet  2. How are you currently taking this medication (dosage and times per day)? 5 mg BID  3. Are you having a reaction (difficulty breathing--STAT)?   4. What is your medication issue? Patient was told by his PCP to double this medicine because his BP was still high. The patient is now almost out of this medication because he has doubled the dosage and his pharmacy will not refill the medication. He can not refill the medication until 02/10/21 with it written as one tablet daily   Please send a new rx to [id number]- [place], [place]

## 2021-01-26 NOTE — Telephone Encounter (Signed)
Left message for pt to call to confirm dose of amlodipine. Aware he should reach out to the PCP for refill since he increased the medication.

## 2021-01-26 NOTE — Telephone Encounter (Signed)
Refill sent to the pharmacy electronically.  

## 2021-01-26 NOTE — Telephone Encounter (Signed)
Pt c/o medication issue:  1. Name of Medication:  amLODipine (NORVASC) 5 MG tablet  2. How are you currently taking this medication (dosage and times per day)?  Two 5 MG tablets (10  MG total) daily  3. Are you having a reaction (difficulty breathing--STAT)?  No   4. What is your medication issue?   Patient is following up after receiving voice message from RN. He wanted to verify he has been taking 1 tablet (5 MG) twice daily (10 MG total), per PCP.

## 2021-02-17 ENCOUNTER — Other Ambulatory Visit: Payer: Self-pay

## 2021-02-17 ENCOUNTER — Ambulatory Visit (INDEPENDENT_AMBULATORY_CARE_PROVIDER_SITE_OTHER): Payer: 59 | Admitting: Internal Medicine

## 2021-02-17 ENCOUNTER — Encounter: Payer: Self-pay | Admitting: Internal Medicine

## 2021-02-17 VITALS — BP 130/78 | HR 86 | Ht 72.0 in | Wt 239.0 lb

## 2021-02-17 DIAGNOSIS — E785 Hyperlipidemia, unspecified: Secondary | ICD-10-CM

## 2021-02-17 DIAGNOSIS — I10 Essential (primary) hypertension: Secondary | ICD-10-CM | POA: Diagnosis not present

## 2021-02-17 DIAGNOSIS — E781 Pure hyperglyceridemia: Secondary | ICD-10-CM

## 2021-02-17 DIAGNOSIS — Z7189 Other specified counseling: Secondary | ICD-10-CM

## 2021-02-17 DIAGNOSIS — R7303 Prediabetes: Secondary | ICD-10-CM

## 2021-02-17 DIAGNOSIS — R072 Precordial pain: Secondary | ICD-10-CM

## 2021-02-17 DIAGNOSIS — Z79899 Other long term (current) drug therapy: Secondary | ICD-10-CM

## 2021-02-17 MED ORDER — ROSUVASTATIN CALCIUM 40 MG PO TABS
40.0000 mg | ORAL_TABLET | Freq: Every day | ORAL | 3 refills | Status: DC
Start: 2021-02-17 — End: 2021-03-23

## 2021-02-17 NOTE — Patient Instructions (Signed)
Medication Instructions:  INCREASE CRESTOR (ROSUVASTATIN) TO '40mg'$  DAILY  *If you need a refill on your cardiac medications before your next appointment, please call your pharmacy*  Lab Work: REPEAT LIPID AND LIVER BLOOD WORK IN 3 MONTHS- YOU WILL RECEIVE A LAB REMINDER IN THE MAIL ABOUT 3-4 WEEKS PRIOR TO BLOOD WORK BEING DUE If you have labs (blood work) drawn today and your tests are completely normal, you will receive your results only by: [place] (if you have MyChart) OR A paper copy in the mail If you have any lab test that is abnormal or we need to change your treatment, we will call you to review the results.  Follow-Up: At [facility], you and your health needs are our priority.  As part of our continuing mission to provide you with exceptional heart care, we have created designated Provider Care Teams.  These Care Teams include your primary Cardiologist (physician) and Advanced Practice Providers (APPs -  Physician Assistants and Nurse Practitioners) who all work together to provide you with the care you need, when you need it.  Your next appointment:   6 month(s)  The format for your next appointment:   In Person  Provider:   You will see one of the following Advanced Practice Providers on your designated Care Team:   Sande Rives PA-C Almyra Deforest PA-C

## 2021-02-17 NOTE — Progress Notes (Signed)
Cardiology Office Note:    Date:  02/17/2021   ID:  Lawrence Henderson, DOB July 23, 1955, MRN [id number]  PCP:  Mayra Neer, MD  Cardiologist:  None  Electrophysiologist:  None   Referring MD: Mayra Neer, MD   Chief Complaint/Reason for Referral: Follow-up hospitalization for chest pain  History of Present Illness:    Lawrence Henderson is a 65 y.o. male with a history of chest pain with low risk Myoview in 2011 and 2018, chronic dyspnea since COVID pneumonia in 12/2019, hypertension, hyperlipidemia, and obesity.    During his most recent hospitalization for chest pain we performed a coronary CTA demonstrating minimal to mild nonobstructive CAD, coronary calcium score of 14 which is 40th percentile for age and sex matched controls, normal coronary origins, and no significant incidental extracardiac findings, particularly in light of his history of COVID-19 and chronic dyspnea.  He presents today for cardiovascular follow-up and is overall doing well.  No significant recurrence of chest pain.  He has been trying to watch his diet both from a lipid standpoint as well as GERD, and takes famotidine as well.  He has a history of hypertension and takes amlodipine, lisinopril.  At times his blood pressure readings will be 155/80, but today is relatively normal.  Recently his primary care doctor uptitrated his amlodipine to 10 mg daily and he is tolerating this well without lower extremity edema or signs of hypotension.  We reviewed opportunities to optimize his lipid control with elevated triglycerides.  We reviewed dietary modifications that can be made to reduce triglycerides, and also reviewed medication changes that can be performed.  Fortunately his LDL is at goal at 63 and he has a good HDL of 51.  He does not feel he will be able to make the significant dietary changes needed to aggressively lower his triglycerides and therefore we decided on a strategy of medication titration today.  The  patient denies chest pain, chest pressure, dyspnea at rest or with exertion, palpitations, PND, orthopnea, or leg swelling. Denies cough, fever, chills. Denies nausea, vomiting. Denies syncope or presyncope. Denies dizziness or lightheadedness.    Past Medical History:  Diagnosis Date   Hyperlipidemia    Hypertension     Past Surgical History:  Procedure Laterality Date   APPENDECTOMY     arm surgery     right arm 2006   TONSILLECTOMY      Current Medications: Current Meds  Medication Sig   amLODipine (NORVASC) 5 MG tablet Take 1 tablet (5 mg total) by mouth 2 (two) times daily.   aspirin EC 81 MG tablet Take 81 mg by mouth daily.   co-enzyme Q-10 30 MG capsule Take 30 mg by mouth daily.    famotidine (PEPCID) 20 MG tablet Take 20 mg by mouth daily.   lisinopril (PRINIVIL,ZESTRIL) 20 MG tablet Take 20 mg by mouth daily.   Multiple Vitamin (MULTIVITAMIN WITH MINERALS) TABS tablet Take 1 tablet by mouth daily.   rosuvastatin (CRESTOR) 20 MG tablet Take 20 mg by mouth daily.   vitamin C (ASCORBIC ACID) 500 MG tablet Take 500 mg by mouth daily.   zinc gluconate 50 MG tablet Take 50 mg by mouth daily.     Allergies:   Sulfa antibiotics   Social History   Tobacco Use   Smoking status: Former    Packs/day: 0.25    Types: Cigarettes    Quit date: 1975    Years since quitting: 47.6   Smokeless tobacco: Never  Tobacco comments:    smoked for about a month or two  Vaping Use   Vaping Use: Never used  Substance Use Topics   Alcohol use: Yes   Drug use: No    Comment: Quit when he was 21     Family History: The patient's family history includes Atrial fibrillation in his mother; Diabetes in his paternal grandfather; Diabetes Mellitus II in his paternal grandfather; Heart attack in his father; Lung cancer in his mother.  ROS:   Please see the history of present illness.    All other systems reviewed and are negative.  EKGs/Labs/Other Studies Reviewed:    The following  studies were reviewed today:  EKG:  NSR  I have independently reviewed the images from coronary CTA 01/14/21.  Recent Labs: 01/13/2021: Hemoglobin 14.7; Platelets 207 01/14/2021: BUN 11; Creatinine, Ser 0.84; Potassium 3.9; Sodium 139  Recent Lipid Panel    Component Value Date/Time   CHOL 149 01/14/2021 0203   TRIG 175 (H) 01/14/2021 0203   HDL 51 01/14/2021 0203   CHOLHDL 2.9 01/14/2021 0203   VLDL 35 01/14/2021 0203   LDLCALC 63 01/14/2021 0203    Physical Exam:    VS:  BP 130/78   Pulse 86   Ht 6' (1.829 m)   Wt 239 lb (108.4 kg)   SpO2 96%   BMI 32.41 kg/m     Wt Readings from Last 5 Encounters:  02/17/21 239 lb (108.4 kg)  01/13/21 232 lb 4.8 oz (105.4 kg)  08/15/20 254 lb 6.4 oz (115.4 kg)  05/19/20 249 lb 9.6 oz (113.2 kg)  02/12/20 234 lb 6.4 oz (106.3 kg)    Constitutional: No acute distress Eyes: sclera non-icteric, normal conjunctiva and lids ENMT: normal dentition, moist mucous membranes Cardiovascular: regular rhythm, normal rate, no murmurs. S1 and S2 normal. Radial pulses normal bilaterally. No jugular venous distention.  Respiratory: clear to auscultation bilaterally GI : normal bowel sounds, soft and nontender. No distention.   MSK: extremities warm, well perfused. No edema.  NEURO: grossly nonfocal exam, moves all extremities. PSYCH: alert and oriented x 3, normal mood and affect.   ASSESSMENT:    1. Pure hypertriglyceridemia   2. Primary hypertension   3. Precordial pain   4. Pre-diabetes   5. Cardiac risk counseling   6. Medication management    PLAN:    Pure hypertriglyceridemia - Plan: Hepatic function panel, Lipid panel -We discussed increasing Crestor to 2 tablets daily which would be a total of 40 mg daily for the next 2 to 3 weeks.  If he has any increase in myalgias on top of his current longstanding joint pain, we will reduce the dose back to 20 mg daily which is stable for him.  I would like to see triglycerides drop below 150, if  we are unable to achieve this we will discuss alternative strategies.  Recheck labs in 6 to 12 weeks.  Primary hypertension-blood pressure overall stable with amlodipine 10 mg daily, lisinopril 20 mg daily, continue at these doses.  Precordial pain-noted to have coronary artery disease which is minimal to mild with mild coronary artery calcifications as well.  Continue on aspirin 81 mg daily and statin as noted above.  Cardiac risk counseling Medication management -As above  Total time of encounter: 30 minutes total time of encounter, including 20 minutes spent in face-to-face patient care on the date of this encounter. This time includes coordination of care and counseling regarding above mentioned problem list. Remainder of  non-face-to-face time involved reviewing chart documents/testing relevant to the patient encounter and documentation in the medical record. I have independently reviewed documentation from referring provider.   Cherlynn Kaiser, MD, [place]   Shared Decision Making/Informed Consent:       Medication Adjustments/Labs and Tests Ordered: Current medicines are reviewed at length with the patient today.  Concerns regarding medicines are outlined above.   Orders Placed This Encounter  Procedures   Hepatic function panel   Lipid panel    Meds ordered this encounter  Medications   rosuvastatin (CRESTOR) 40 MG tablet    Sig: Take 1 tablet (40 mg total) by mouth daily.    Dispense:  90 tablet    Refill:  3    Patient Instructions  Medication Instructions:  INCREASE CRESTOR (ROSUVASTATIN) TO '40mg'$  DAILY  *If you need a refill on your cardiac medications before your next appointment, please call your pharmacy*  Lab Work: REPEAT LIPID AND LIVER BLOOD WORK IN 3 MONTHS- YOU WILL RECEIVE A LAB REMINDER IN THE MAIL ABOUT 3-4 WEEKS PRIOR TO BLOOD WORK BEING DUE If you have labs (blood work) drawn today and your tests are completely normal, you  will receive your results only by: [place] (if you have MyChart) OR A paper copy in the mail If you have any lab test that is abnormal or we need to change your treatment, we will call you to review the results.  Follow-Up: At [facility], you and your health needs are our priority.  As part of our continuing mission to provide you with exceptional heart care, we have created designated Provider Care Teams.  These Care Teams include your primary Cardiologist (physician) and Advanced Practice Providers (APPs -  Physician Assistants and Nurse Practitioners) who all work together to provide you with the care you need, when you need it.  Your next appointment:   6 month(s)  The format for your next appointment:   In Person  Provider:   You will see one of the following Advanced Practice Providers on your designated Care Team:   Sande Rives PA-C Almyra Deforest PA-C

## 2021-02-24 NOTE — Addendum Note (Signed)
Addended by: Jacqulynn Cadet on: 02/24/2021 09:55 AM   Modules accepted: Orders

## 2021-03-10 ENCOUNTER — Ambulatory Visit: Payer: 59 | Admitting: Pulmonary Disease

## 2021-03-18 ENCOUNTER — Telehealth: Payer: Self-pay

## 2021-03-18 NOTE — Telephone Encounter (Signed)
Attempted to call patient to see how he is doing on the increased dose of Rosuvastatin at the request of Dr. Margaretann Loveless. Left message for patient to call back when able.

## 2021-03-23 ENCOUNTER — Telehealth: Payer: Self-pay | Admitting: Internal Medicine

## 2021-03-23 DIAGNOSIS — Z79899 Other long term (current) drug therapy: Secondary | ICD-10-CM

## 2021-03-23 MED ORDER — ROSUVASTATIN CALCIUM 20 MG PO TABS: 20.0000 mg | ORAL_TABLET | Freq: Every day | ORAL | 3 refills | Status: AC

## 2021-03-23 MED ORDER — HYDROCHLOROTHIAZIDE 25 MG PO TABS: 25.0000 mg | ORAL_TABLET | Freq: Every day | ORAL | 3 refills | Status: DC

## 2021-03-23 NOTE — Telephone Encounter (Signed)
Pt is returning a call in regards to his medication amlodipine please advise

## 2021-03-23 NOTE — Telephone Encounter (Signed)
Returned call to Lawrence Henderson, made Lawrence Henderson aware of Lawrence Henderson recommendations as follows:  Lawrence Munroe, MD  You 3 hours ago (1:10 PM)   Decrease crestor back to 20 mg daily.  In a few days, add HCTZ 25 mg daily and about 7-10 days after that get BMET  See if we can get Lawrence Henderson in for follow up before I go on leave.  [place]     Lawrence Henderson aware and verbalized understanding, HCTZ 25mg  Daily and Rosuvastatin 20mg  Daily sent to Lawrence Henderson preferred pharmacy. Order for BMET in 7 days placed, appointment made for 04/20/21 at 8:40AM with Lawrence Henderson to follow up.   Advised Lawrence Henderson to call back to office with any issues, questions, or concerns. Lawrence Henderson verbalized understanding.

## 2021-03-23 NOTE — Telephone Encounter (Signed)
Pt called to repot that he is having some "joint pain" since he had to increase his Crestor to 40 mg. He says he has had the pain consistently daily. He takes the Crestor in the mornings and has taken his dose today.   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   He denies headache, dizziness, chest pain...   I advised him that I will forward to Dr. Margaretann Loveless for her review. To continue to monitor and let us know if he develops any further symptoms.

## 2021-03-24 NOTE — Telephone Encounter (Signed)
Please see other telephone encounter. Will close this encounter at this time.

## 2021-04-03 ENCOUNTER — Other Ambulatory Visit: Payer: Self-pay

## 2021-04-03 DIAGNOSIS — E781 Pure hyperglyceridemia: Secondary | ICD-10-CM

## 2021-04-03 LAB — LIPID PANEL
Chol/HDL Ratio: 2.8 ratio (ref 0.0–5.0)
Cholesterol, Total: 167 mg/dL (ref 100–199)
HDL: 60 mg/dL (ref >39)
LDL Chol Calc (NIH): 88 mg/dL (ref 0–99)
Triglycerides: 108 mg/dL (ref 0–149)
VLDL Cholesterol Cal: 19 mg/dL (ref 5–40)

## 2021-04-03 LAB — HEPATIC FUNCTION PANEL
ALT: 27 IU/L (ref 0–44)
AST: 23 IU/L (ref 0–40)
Albumin: 4.9 g/dL — ABNORMAL HIGH (ref 3.8–4.8)
Alkaline Phosphatase: 74 IU/L (ref 44–121)
Bilirubin Total: 0.8 mg/dL (ref 0.0–1.2)
Bilirubin, Direct: 0.2 mg/dL (ref 0.00–0.40)
Total Protein: 7 g/dL (ref 6.0–8.5)

## 2021-04-20 ENCOUNTER — Encounter: Payer: Self-pay | Admitting: Internal Medicine

## 2021-04-20 ENCOUNTER — Other Ambulatory Visit: Payer: Self-pay

## 2021-04-20 ENCOUNTER — Ambulatory Visit (INDEPENDENT_AMBULATORY_CARE_PROVIDER_SITE_OTHER): Payer: 59 | Admitting: Internal Medicine

## 2021-04-20 VITALS — BP 118/76 | HR 84 | Ht 72.0 in | Wt 240.0 lb

## 2021-04-20 DIAGNOSIS — R072 Precordial pain: Secondary | ICD-10-CM | POA: Diagnosis not present

## 2021-04-20 DIAGNOSIS — E785 Hyperlipidemia, unspecified: Secondary | ICD-10-CM | POA: Diagnosis not present

## 2021-04-20 DIAGNOSIS — I10 Essential (primary) hypertension: Secondary | ICD-10-CM | POA: Diagnosis not present

## 2021-04-20 DIAGNOSIS — Z7189 Other specified counseling: Secondary | ICD-10-CM

## 2021-04-20 DIAGNOSIS — Z79899 Other long term (current) drug therapy: Secondary | ICD-10-CM

## 2021-04-20 LAB — BASIC METABOLIC PANEL
BUN/Creatinine Ratio: 16 (ref 10–24)
BUN: 15 mg/dL (ref 8–27)
CO2: 24 mmol/L (ref 20–29)
Calcium: 10 mg/dL (ref 8.6–10.2)
Chloride: 99 mmol/L (ref 96–106)
Creatinine, Ser: 0.96 mg/dL (ref 0.76–1.27)
Glucose: 114 mg/dL — ABNORMAL HIGH (ref 70–99)
Potassium: 4.3 mmol/L (ref 3.5–5.2)
Sodium: 139 mmol/L (ref 134–144)
eGFR: 88 mL/min/{1.73_m2} (ref >59)

## 2021-04-20 NOTE — Progress Notes (Signed)
Cardiology Office Note:    Date:  04/20/2021   ID:  Lawrence Henderson, DOB [birth year], MRN [id number]  PCP:  Mayra Neer, MD  Cardiologist:  None  Electrophysiologist:  None   Referring MD: Mayra Neer, MD   Chief Complaint/Reason for Referral: Follow-up HTN, HLD  History of Present Illness:    Lawrence Henderson is a 65 y.o. male with a history of chest pain with low risk Myoview in 2011 and 2018, chronic dyspnea since COVID pneumonia in 12/2019, hypertension, hyperlipidemia, and obesity.  He presents today for a 1 month follow-up after starting HCTZ.  He is tolerating the HCTZ started from his last visit.   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 When he records his blood pressure at home, he reports average measurements of 135/80s.  One month ago, his Crestor decreased from 40mg  to 20mg . His cholesterol and triglyceride readings having improved since his last visit. We discussed possibly taking Zetia along with Crestor for cholesterol management. However, he would prefer to continue working on his weight and diet.   A few days ago, his bilateral LE were red and swollen. He elevated and flexed his legs which helped resolve the swelling. We discussed wearing compression socks to prevent edema in the future.  The patient denies chest pain, chest pressure, dyspnea at rest or with exertion, PND, or orthopnea. Denies cough, fever, chills, nausea, or vomiting. Denies syncope, presyncope, or snoring. Denies dizziness or lightheadedness.   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 He has a history of hypertension and takes amlodipine, lisinopril.  His primary care doctor uptitrated his amlodipine to 10 mg daily and he was tolerating this well without lower extremity edema or signs of hypotension. We reviewed dietary modifications that can be made to reduce triglycerides, and also reviewed medication changes that can be performed. His LDL was at goal at 63 and he has a HDL of 51.  He did not feel he will be able to make the significant dietary changes needed to aggressively lower his triglycerides and therefore we decided on a strategy of medication titration today.   Past Medical History:  Diagnosis Date   Hyperlipidemia    Hypertension     Past Surgical History:  Procedure Laterality Date   APPENDECTOMY     arm surgery     right arm 2006   TONSILLECTOMY      Current Medications: Current Meds  Medication Sig   amLODipine (NORVASC) 5 MG tablet Take 1 tablet (5 mg total) by mouth 2 (two) times daily.   aspirin EC 81 MG tablet Take 81 mg by mouth daily.   co-enzyme Q-10 30 MG capsule Take 30 mg by mouth daily.    famotidine (PEPCID) 20 MG tablet Take 20 mg by mouth daily.   hydrochlorothiazide (HYDRODIURIL) 25 MG tablet Take 1 tablet (25 mg total) by mouth daily.   lisinopril (PRINIVIL,ZESTRIL) 20 MG tablet Take 20 mg by mouth daily.   Multiple Vitamin (MULTIVITAMIN WITH MINERALS) TABS tablet Take 1 tablet by mouth daily.   rosuvastatin (CRESTOR) 20 MG tablet Take 1 tablet (20 mg total) by mouth daily.   vitamin C (ASCORBIC ACID) 500 MG tablet Take 500 mg by  mouth daily.   zinc gluconate 50 MG tablet Take 50 mg by mouth daily.     Allergies:   Sulfa antibiotics   Social History   Tobacco Use   Smoking status: Former    Packs/day: 0.25    Types: Cigarettes    Quit date: 1975    Years since quitting: 47.8   Smokeless tobacco: Never   Tobacco comments:    smoked for about a month or two  Vaping Use   Vaping Use: Never used  Substance Use Topics   Alcohol use: Yes   Drug use: No    Comment: Quit when he was 7      Family History: The patient's family history includes Atrial fibrillation in his mother; Diabetes in his paternal grandfather; Diabetes Mellitus II in his paternal grandfather; Heart attack in his father; Lung cancer in his mother.  ROS:   Please see the history of present illness.  (+) Bilateral LE edema   All other systems reviewed and are negative.  EKGs/Labs/Other Studies Reviewed:    The following studies were reviewed today: Echo 09/15/2020 1. Left ventricular ejection fraction, by estimation, is 60 to 65%. The  left ventricle has normal function. The left ventricle has no regional  wall motion abnormalities. There is mild asymmetric left ventricular  hypertrophy of the basal and septal  segments. Left ventricular diastolic parameters were normal. The average  left ventricular global longitudinal strain is -22.0 %. The global  longitudinal strain is normal.   2. Right ventricular systolic function is normal. The right ventricular  size is normal.   3. Left atrial size was mildly dilated.   4. The mitral valve is normal in structure. Trivial mitral valve  regurgitation. No evidence of mitral stenosis.   5. The aortic valve was not well visualized. Aortic valve regurgitation  is not visualized. No aortic stenosis is present.   6. The inferior vena cava is normal in size with greater than 50%  respiratory variability, suggesting right atrial pressure of 3 mmHg.   EKG:   04/20/2021: NSR, 84 bpm, Non-specific T-wave abnormality 02/17/2021: NSR  I have independently reviewed the images from coronary CTA 01/14/21.  Recent Labs: 01/13/2021: Hemoglobin 14.7; Platelets 207 01/14/2021: BUN 11; Creatinine, Ser 0.84; Potassium 3.9; Sodium 139 04/03/2021: ALT 27  Recent Lipid Panel    Component Value Date/Time   CHOL 167 04/03/2021 1147   TRIG 108 04/03/2021 1147   HDL 60 04/03/2021 1147   CHOLHDL 2.8 04/03/2021 1147   CHOLHDL 2.9 01/14/2021 0203   VLDL 35 01/14/2021 0203    LDLCALC 88 04/03/2021 1147    Physical Exam:    VS:  BP 118/76 (BP Location: Left Arm, Patient Position: Sitting, Cuff Size: Large)   Pulse 84   Ht 6' (1.829 m)   Wt 240 lb (108.9 kg)   BMI 32.55 kg/m     Wt Readings from Last 5 Encounters:  04/20/21 240 lb (108.9 kg)  02/17/21 239 lb (108.4 kg)  01/13/21 232 lb 4.8 oz (105.4 kg)  08/15/20 254 lb 6.4 oz (115.4 kg)  05/19/20 249 lb 9.6 oz (113.2 kg)    Constitutional: No acute distress Eyes: sclera non-icteric, normal conjunctiva and lids ENMT: normal dentition, moist mucous membranes Cardiovascular: regular rhythm, normal rate, no murmurs. S1 and S2 normal. Radial pulses normal bilaterally. No jugular venous distention.  Respiratory: clear to auscultation bilaterally GI : normal bowel sounds, soft and nontender. No distention.   MSK: extremities warm, well perfused.  No edema.  NEURO: grossly nonfocal exam, moves all extremities. PSYCH: alert and oriented x 3, normal mood and affect.   ASSESSMENT:    1. Medication management   2. Hyperlipidemia, unspecified hyperlipidemia type   3. Primary hypertension   4. Precordial pain   5. Cardiac risk counseling     PLAN:    HLD - Lipids overall well managed, continued Crestor at 20 mg daily as he did not tolerate the higher dose of 40 mg daily. Will reassess lipids and if suboptimally controlled with lower dose of crestor, will add zetia 10 mg daily.  Primary hypertension-blood pressure well managed now with addition of HCTZ 25 mg daily, and with amlodipine 10 mg daily, lisinopril 20 mg daily, continue at these doses. Labs today to review renal function on HCTZ.  Precordial pain-noted to have nonobstructive coronary artery disease which is minimal to mild with mild coronary artery calcifications as well.  Continue on aspirin 81 mg daily and statin as noted above.  Cardiac risk counseling Medication management -As above   Total time of encounter: 30 minutes total time of  encounter, including 20 minutes spent in face-to-face patient care on the date of this encounter. This time includes coordination of care and counseling regarding above mentioned problem list. Remainder of non-face-to-face time involved reviewing chart documents/testing relevant to the patient encounter and documentation in the medical record. I have independently reviewed documentation from referring provider.   Cherlynn Kaiser, MD, [place]   Shared Decision Making/Informed Consent:       Medication Adjustments/Labs and Tests Ordered: Current medicines are reviewed at length with the patient today.  Concerns regarding medicines are outlined above.   Orders Placed This Encounter  Procedures   Basic metabolic panel   Lipid panel   EKG 12-Lead     No orders of the defined types were placed in this encounter.    Patient Instructions  Medication Instructions:  No Changes In Medications at this time.  *If you need a refill on your cardiac medications before your next appointment, please call your pharmacy*  Lab Work: BMET- [place]  If you have labs (blood work) drawn today and your tests are completely normal, you will receive your results only by: [place] (if you have MyChart) OR A paper copy in the mail If you have any lab test that is abnormal or we need to change your treatment, we will call you to review the results.  Follow-Up: At [facility], you and your health needs are our priority.  As part of our continuing mission to provide you with exceptional heart care, we have created designated Provider Care Teams.  These Care Teams include your primary Cardiologist (physician) and Advanced Practice Providers (APPs -  Physician Assistants and Nurse Practitioners) who all work together to provide you with the care you need, when you need it.  Your next appointment:   AS SCHEDULED   The format  for your next appointment:   In Person  Provider:   Almyra Deforest PA-C    Wilhemina Bonito as a scribe for Elouise Munroe, MD.,have documented all relevant documentation on the behalf of Elouise Munroe, MD,as directed by  Elouise Munroe, MD while in the presence of Elouise Munroe, MD.  I, Elouise Munroe, MD, have reviewed all documentation for this visit. The documentation on today's date of service for the exam, diagnosis, procedures, and orders are  all accurate and complete.

## 2021-04-20 NOTE — Patient Instructions (Addendum)
Medication Instructions:  No Changes In Medications at this time.  *If you need a refill on your cardiac medications before your next appointment, please call your pharmacy*  Lab Work: BMET- [place]  If you have labs (blood work) drawn today and your tests are completely normal, you will receive your results only by: [place] (if you have MyChart) OR A paper copy in the mail If you have any lab test that is abnormal or we need to change your treatment, we will call you to review the results.  Follow-Up: At [facility], you and your health needs are our priority.  As part of our continuing mission to provide you with exceptional heart care, we have created designated Provider Care Teams.  These Care Teams include your primary Cardiologist (physician) and Advanced Practice Providers (APPs -  Physician Assistants and Nurse Practitioners) who all work together to provide you with the care you need, when you need it.  Your next appointment:   AS SCHEDULED   The format for your next appointment:   In Person  Provider:   Almyra Deforest PA-C

## 2021-05-19 ENCOUNTER — Other Ambulatory Visit: Payer: Self-pay | Admitting: Internal Medicine

## 2021-05-28 NOTE — Telephone Encounter (Signed)
*  STAT* If patient is at the pharmacy, call can be transferred to refill team.   1. Which medications need to be refilled? (please list name of each medication and dose if known) amLODipine (NORVASC) 5 MG tablet  2. Which pharmacy/location (including street and city if local pharmacy) is medication to be sent to? [id number] [place], [place]  3. Do they need a 30 day or 90 day supply?  30 day supply     Patient has future appointment scheduled.

## 2021-06-30 DIAGNOSIS — I10 Essential (primary) hypertension: Secondary | ICD-10-CM | POA: Diagnosis not present

## 2021-06-30 DIAGNOSIS — R7303 Prediabetes: Secondary | ICD-10-CM | POA: Diagnosis not present

## 2021-06-30 DIAGNOSIS — E782 Mixed hyperlipidemia: Secondary | ICD-10-CM | POA: Diagnosis not present

## 2021-07-10 DIAGNOSIS — H5213 Myopia, bilateral: Secondary | ICD-10-CM | POA: Diagnosis not present

## 2021-07-10 DIAGNOSIS — H4720 Unspecified optic atrophy: Secondary | ICD-10-CM | POA: Diagnosis not present

## 2021-07-10 DIAGNOSIS — H2513 Age-related nuclear cataract, bilateral: Secondary | ICD-10-CM | POA: Diagnosis not present

## 2021-07-20 ENCOUNTER — Other Ambulatory Visit: Payer: Self-pay | Admitting: Internal Medicine

## 2021-07-24 ENCOUNTER — Telehealth: Payer: Self-pay | Admitting: Internal Medicine

## 2021-07-24 MED ORDER — HYDROCHLOROTHIAZIDE 25 MG PO TABS: 25.0000 mg | ORAL_TABLET | Freq: Every day | ORAL | 2 refills | Status: DC

## 2021-07-24 NOTE — Telephone Encounter (Signed)
[facility]

## 2021-07-24 NOTE — Telephone Encounter (Signed)
°*  STAT* If patient is at the pharmacy, call can be transferred to refill team.   1. Which medications need to be refilled? (please list name of each medication and dose if known) hydrochlorothiazide (HYDRODIURIL) 25 MG tablet  2. Which pharmacy/location (including street and city if local pharmacy) is medication to be sent to? [id number] [place], [place]  3. Do they need a 30 day or 90 day supply? 90  Patient is out of medication

## 2021-08-02 NOTE — Progress Notes (Signed)
Office Visit    Patient Name: Lawrence Henderson Date of Encounter: 08/03/2021  Primary Care Provider:  Mayra Neer, MD Primary Cardiologist:  Elouise Munroe, MD  Chief Complaint   66 year old male with a history of precordial chest pain, mild non-obstructive CAD, chronic dyspnea following COVID-pneumonia, hypertension, hyperlipidemia and obesity who presents for follow-up related to hypertension.  Past Medical History    Past Medical History:  Diagnosis Date   Hyperlipidemia    Hypertension    Past Surgical History:  Procedure Laterality Date   APPENDECTOMY     arm surgery     right arm 2006   TONSILLECTOMY      Allergies  Allergies  Allergen Reactions   Sulfa Antibiotics Anaphylaxis and Swelling    66 years old, Hospitalized for 2 weeks, unknown    History of Present Illness    66 year old male with a history of precordial chest pain, mild non-obstructive CAD, chronic dyspnea following COVID-pneumonia, hypertension, hyperlipidemia and obesity.  He has a history of negative Myoview in 2011 and 2018.  He has had ongoing precordial chest pain. Coronary CTA in July 2022 showed minimal to mild nonobstructive CAD, calcium score of 30 (40th percentile for age and sex). Most recent echocardiogram in March 2022 showed 60 to 65%, normal LV function, mild LVH, mild LAE, no significant valvular abnormalities.  He does have chronic dyspnea in the setting of prior COVID-pneumonia. He was last seen in the office on 04/20/2021 stable overall from a cardiac standpoint.  BP was well managed on amlodipine, lisinopril and HCTZ.  He presents today for follow-up.  Since his last visit he has been stable from a cardiac standpoint.  He reports occasional mild tingling to his arms bilaterally at rest and with exertion, he has noticed this since he had COVID. He has stable chronic mild dyspnea on exertion, unchanged. He denies any symptoms concerning for angina.  He does report occasional  constipation. He states he does not always drink enough fluids. Overall, he is enjoying retirement concerns or complaints today.  Home Medications    Current Outpatient Medications  Medication Sig Dispense Refill   amLODipine (NORVASC) 5 MG tablet TAKE 1 TABLET BY MOUTH TWICE A DAY 60 tablet 3   aspirin EC 81 MG tablet Take 81 mg by mouth daily.     co-enzyme Q-10 30 MG capsule Take 30 mg by mouth daily.      famotidine (PEPCID) 20 MG tablet Take 20 mg by mouth daily.     hydrochlorothiazide (HYDRODIURIL) 25 MG tablet Take 1 tablet (25 mg total) by mouth daily. 90 tablet 2   lisinopril (PRINIVIL,ZESTRIL) 20 MG tablet Take 20 mg by mouth daily.     Multiple Vitamin (MULTIVITAMIN WITH MINERALS) TABS tablet Take 1 tablet by mouth daily.     rosuvastatin (CRESTOR) 20 MG tablet Take 1 tablet (20 mg total) by mouth daily. 90 tablet 3   vitamin C (ASCORBIC ACID) 500 MG tablet Take 500 mg by mouth daily.     zinc gluconate 50 MG tablet Take 50 mg by mouth daily.     No current facility-administered medications for this visit.     Review of Systems    He denies chest pain, palpitations, pnd, orthopnea, n, v, dizziness, syncope, edema, weight gain, or early satiety. All other systems reviewed and are otherwise negative except as noted above.   Physical Exam    VS:  BP 118/80    Pulse 83  Ht 6' (1.829 m)    Wt 244 lb 3.2 oz (110.8 kg)    SpO2 97%    BMI 33.12 kg/m   GEN: Well nourished, well developed, in no acute distress. HEENT: normal. Neck: Supple, no JVD, carotid bruits, or masses. Cardiac: RRR, no murmurs, rubs, or gallops. No clubbing, cyanosis, edema.  Radials/DP/PT 2+ and equal bilaterally.  Respiratory:  Respirations regular and unlabored, clear to auscultation bilaterally. GI: Soft, nontender, nondistended, BS + x 4. MS: no deformity or atrophy. Skin: warm and dry, no rash. Neuro:  Strength and sensation are intact. Psych: Normal affect.  Accessory Clinical Findings    ECG  personally reviewed by me today - No EKG in office today - no acute changes.  Lab Results  Component Value Date   WBC 7.2 01/13/2021   HGB 14.7 01/13/2021   HCT 44.7 01/13/2021   MCV 89.8 01/13/2021   PLT 207 01/13/2021   Lab Results  Component Value Date   CREATININE 0.96 04/20/2021   BUN 15 04/20/2021   NA 139 04/20/2021   K 4.3 04/20/2021   CL 99 04/20/2021   CO2 24 04/20/2021   Lab Results  Component Value Date   ALT 27 04/03/2021   AST 23 04/03/2021   ALKPHOS 74 04/03/2021   BILITOT 0.8 04/03/2021   Lab Results  Component Value Date   CHOL 167 04/03/2021   HDL 60 04/03/2021   LDLCALC 88 04/03/2021   TRIG 108 04/03/2021   CHOLHDL 2.8 04/03/2021    Lab Results  Component Value Date   HGBA1C 5.5 01/14/2021    Assessment & Plan    1. Nonobstructive CAD: Coronary CTA in July 2022 showed minimal to mild nonobstructive CAD, calcium score of 30 (40th percentile for age and sex). Echo in March 2022 showed 60 to 65%, normal LV function, mild LVH, mild LAE, no significant valvular abnormalities. Stable with no anginal symptoms. No indication for ischemic evaluation at this time. Continue aspirin, amlodipine, HCTZ, lisinopril, and Crestor.  2. Hypertension: BP initially elevated 144/86, rechecked 118/80.  He does not monitor his blood pressure regularly at home but states it is usually controlled at doctors visits. Continue current antihypertensive regimen.   3. Hyperlipidemia: LDL was 88 in 03/2021. Repeat lipid panel today.  He has not tolerated higher dose of statin in the past.  If LDL remains elevated above 70, consider addition of Zetia.  He states he has gained some weight since the holidays.  Encouraged continued lifestyle modifications with diet and exercise.  4. Chronic dyspnea: Stable mild chronic dyspnea on exertion in the setting of prior COVID-pneumonia.  Unchanged from prior visit. He is working ongoing increasing his activity as tolerated and walking daily.    5. Disposition: Follow-up in 6 months.  Lenna Sciara, NP 08/03/2021, 9:32 AM

## 2021-08-03 ENCOUNTER — Other Ambulatory Visit: Payer: Self-pay

## 2021-08-03 ENCOUNTER — Encounter: Payer: Self-pay | Admitting: Nurse Practitioner

## 2021-08-03 ENCOUNTER — Ambulatory Visit: Payer: Medicare Other | Admitting: Nurse Practitioner

## 2021-08-03 VITALS — BP 118/80 | HR 83 | Ht 72.0 in | Wt 244.2 lb

## 2021-08-03 DIAGNOSIS — E785 Hyperlipidemia, unspecified: Secondary | ICD-10-CM | POA: Diagnosis not present

## 2021-08-03 DIAGNOSIS — R0609 Other forms of dyspnea: Secondary | ICD-10-CM | POA: Diagnosis not present

## 2021-08-03 DIAGNOSIS — I10 Essential (primary) hypertension: Secondary | ICD-10-CM

## 2021-08-03 DIAGNOSIS — I2510 Atherosclerotic heart disease of native coronary artery without angina pectoris: Secondary | ICD-10-CM

## 2021-08-03 LAB — LIPID PANEL
Chol/HDL Ratio: 2.9 ratio (ref 0.0–5.0)
Cholesterol, Total: 159 mg/dL (ref 100–199)
HDL: 55 mg/dL (ref >39)
LDL Chol Calc (NIH): 80 mg/dL (ref 0–99)
Triglycerides: 139 mg/dL (ref 0–149)
VLDL Cholesterol Cal: 24 mg/dL (ref 5–40)

## 2021-08-03 NOTE — Patient Instructions (Addendum)
Medication Instructions:  Your physician recommends that you continue on your current medications as directed. Please refer to the Current Medication list given to you today.   *If you need a refill on your cardiac medications before your next appointment, please call your pharmacy*   Lab Work: Your physician recommends that you return for fasting lab work today. Lipid I f you have labs (blood work) drawn today and your tests are completely normal, you will receive your results only by: MyChart Message (if you have MyChart) OR A paper copy in the mail If you have any lab test that is abnormal or we need to change your treatment, we will call you to review the results.   Testing/Procedures: NONE ordered at this time of appointment     Follow-Up: At [facility], you and your health needs are our priority.  As part of our continuing mission to provide you with exceptional heart care, we have created designated Provider Care Teams.  These Care Teams include your primary Cardiologist (physician) and Advanced Practice Providers (APPs -  Physician Assistants and Nurse Practitioners) who all work together to provide you with the care you need, when you need it.  We recommend signing up for the patient portal called "MyChart".  Sign up information is provided on this After Visit Summary.  MyChart is used to connect with patients for Virtual Visits (Telemedicine).  Patients are able to view lab/test results, encounter notes, upcoming appointments, etc.  Non-urgent messages can be sent to your provider as well.   To learn more about what you can do with MyChart, go to [email or web address].    Your next appointment:   6 month(s)  The format for your next appointment:   In Person  Provider:   Elouise Munroe, MD  or APP    Other Instructions

## 2021-08-05 ENCOUNTER — Other Ambulatory Visit: Payer: Self-pay

## 2021-08-05 DIAGNOSIS — Z79899 Other long term (current) drug therapy: Secondary | ICD-10-CM

## 2021-08-05 DIAGNOSIS — E785 Hyperlipidemia, unspecified: Secondary | ICD-10-CM

## 2021-08-05 MED ORDER — EZETIMIBE 10 MG PO TABS: 10.0000 mg | ORAL_TABLET | Freq: Every day | ORAL | 3 refills | Status: DC

## 2021-08-22 ENCOUNTER — Other Ambulatory Visit: Payer: Self-pay | Admitting: Internal Medicine

## 2021-09-25 ENCOUNTER — Telehealth: Payer: Self-pay | Admitting: Internal Medicine

## 2021-09-25 MED ORDER — AMLODIPINE BESYLATE 5 MG PO TABS: 5.0000 mg | ORAL_TABLET | Freq: Two times a day (BID) | ORAL | 3 refills | Status: DC

## 2021-09-25 NOTE — Telephone Encounter (Signed)
?*  STAT* If patient is at the pharmacy, call can be transferred to refill team. ? ? ?1. Which medications need to be refilled? (please list name of each medication and dose if known) amLODipine (NORVASC) 5 MG tablet ? ?2. Which pharmacy/location (including street and city if local pharmacy) is medication to be sent to?[id number] [place] ?3. Do they need a 30 day or 90 day supply? 90  ?

## 2021-10-29 ENCOUNTER — Ambulatory Visit
Admission: RE | Admit: 2021-10-29 | Discharge: 2021-10-29 | Disposition: A | Discharge status: Home / Self Care | Payer: Medicare Other | Source: Ambulatory Visit | Attending: Physician Assistant | Admitting: Physician Assistant

## 2021-10-29 ENCOUNTER — Other Ambulatory Visit: Payer: Self-pay | Admitting: Physician Assistant

## 2021-10-29 DIAGNOSIS — R109 Unspecified abdominal pain: Secondary | ICD-10-CM

## 2021-10-29 DIAGNOSIS — K5732 Diverticulitis of large intestine without perforation or abscess without bleeding: Secondary | ICD-10-CM | POA: Diagnosis not present

## 2021-10-29 DIAGNOSIS — K2289 Other specified disease of esophagus: Secondary | ICD-10-CM | POA: Diagnosis not present

## 2021-10-29 MED ORDER — IOPAMIDOL (ISOVUE-300) INJECTION 61%
100.0000 mL | Freq: Once | INTRAVENOUS | Status: AC | PRN
  Administered 2021-10-29: 100 mL via INTRAVENOUS

## 2021-11-03 ENCOUNTER — Other Ambulatory Visit: Payer: Self-pay

## 2021-11-03 DIAGNOSIS — E785 Hyperlipidemia, unspecified: Secondary | ICD-10-CM | POA: Diagnosis not present

## 2021-11-03 DIAGNOSIS — Z79899 Other long term (current) drug therapy: Secondary | ICD-10-CM | POA: Diagnosis not present

## 2021-11-03 LAB — LIPID PANEL
Chol/HDL Ratio: 2.6 ratio (ref 0.0–5.0)
Cholesterol, Total: 136 mg/dL (ref 100–199)
HDL: 53 mg/dL (ref >39)
LDL Chol Calc (NIH): 63 mg/dL (ref 0–99)
Triglycerides: 112 mg/dL (ref 0–149)
VLDL Cholesterol Cal: 20 mg/dL (ref 5–40)

## 2021-11-04 ENCOUNTER — Telehealth: Payer: Self-pay

## 2021-11-04 MED ORDER — EZETIMIBE 10 MG PO TABS: 10.0000 mg | ORAL_TABLET | Freq: Every day | ORAL | 3 refills | Status: DC

## 2021-11-04 MED ORDER — AMLODIPINE BESYLATE 10 MG PO TABS: 10.0000 mg | ORAL_TABLET | Freq: Every day | ORAL | 3 refills | Status: DC

## 2021-11-04 NOTE — Telephone Encounter (Signed)
Spoke with Lawrence Henderson. Lawrence Henderson was notified of results and will follow up as planned. Lawrence Henderson asked for refills of Zetia, ok per Raquel Sarna to Refill. Lawrence Henderson also asked if his Amlodipine could be changed to a 10 mg tab instead of taking 5 mg bid. Ok per [place] to send in Amlodipine 10 mg daily.  ?

## 2021-11-12 ENCOUNTER — Telehealth: Payer: Self-pay | Admitting: Internal Medicine

## 2021-11-12 NOTE — Telephone Encounter (Signed)
Returned call to patient, advised him that the letter was for the repeat lipid panel that is already completed. Patient aware and verbalized understanding.

## 2021-11-12 NOTE — Telephone Encounter (Signed)
Pt states that he received a letter regarding having labs done. Pt says that he just had labs completed last week on 11/03/21. Please advise

## 2021-12-06 DIAGNOSIS — M25562 Pain in left knee: Secondary | ICD-10-CM | POA: Diagnosis not present

## 2021-12-11 DIAGNOSIS — M25552 Pain in left hip: Secondary | ICD-10-CM | POA: Diagnosis not present

## 2021-12-11 DIAGNOSIS — S70262A Insect bite (nonvenomous), left hip, initial encounter: Secondary | ICD-10-CM | POA: Diagnosis not present

## 2021-12-11 DIAGNOSIS — K5792 Diverticulitis of intestine, part unspecified, without perforation or abscess without bleeding: Secondary | ICD-10-CM | POA: Diagnosis not present

## 2021-12-31 DIAGNOSIS — M25562 Pain in left knee: Secondary | ICD-10-CM | POA: Diagnosis not present

## 2022-01-01 DIAGNOSIS — I10 Essential (primary) hypertension: Secondary | ICD-10-CM | POA: Diagnosis not present

## 2022-01-01 DIAGNOSIS — Z0000 Encounter for general adult medical examination without abnormal findings: Secondary | ICD-10-CM | POA: Diagnosis not present

## 2022-01-01 DIAGNOSIS — K5790 Diverticulosis of intestine, part unspecified, without perforation or abscess without bleeding: Secondary | ICD-10-CM | POA: Diagnosis not present

## 2022-01-01 DIAGNOSIS — Z1159 Encounter for screening for other viral diseases: Secondary | ICD-10-CM | POA: Diagnosis not present

## 2022-01-01 DIAGNOSIS — R7303 Prediabetes: Secondary | ICD-10-CM | POA: Diagnosis not present

## 2022-01-01 DIAGNOSIS — U099 Post covid-19 condition, unspecified: Secondary | ICD-10-CM | POA: Diagnosis not present

## 2022-01-01 DIAGNOSIS — E782 Mixed hyperlipidemia: Secondary | ICD-10-CM | POA: Diagnosis not present

## 2022-01-01 DIAGNOSIS — Z23 Encounter for immunization: Secondary | ICD-10-CM | POA: Diagnosis not present

## 2022-01-01 DIAGNOSIS — I2510 Atherosclerotic heart disease of native coronary artery without angina pectoris: Secondary | ICD-10-CM | POA: Diagnosis not present

## 2022-01-30 ENCOUNTER — Other Ambulatory Visit: Payer: Self-pay | Admitting: Internal Medicine

## 2022-03-12 DIAGNOSIS — K5730 Diverticulosis of large intestine without perforation or abscess without bleeding: Secondary | ICD-10-CM | POA: Diagnosis not present

## 2022-03-12 DIAGNOSIS — K648 Other hemorrhoids: Secondary | ICD-10-CM | POA: Diagnosis not present

## 2022-03-12 DIAGNOSIS — D122 Benign neoplasm of ascending colon: Secondary | ICD-10-CM | POA: Diagnosis not present

## 2022-03-12 DIAGNOSIS — K5732 Diverticulitis of large intestine without perforation or abscess without bleeding: Secondary | ICD-10-CM | POA: Diagnosis not present

## 2022-03-16 DIAGNOSIS — D122 Benign neoplasm of ascending colon: Secondary | ICD-10-CM | POA: Diagnosis not present

## 2022-04-06 NOTE — Progress Notes (Signed)
Cardiology Clinic Note   Patient Name: Lawrence Henderson Date of Encounter: 04/09/2022  Primary Care Provider:  Mayra Neer, MD Primary Cardiologist:  Elouise Munroe, MD  Patient Profile    Lawrence Henderson 66 year old male presents to the clinic today for follow-up evaluation of his hypertension and hyperlipidemia.  Past Medical History    Past Medical History:  Diagnosis Date   Hyperlipidemia    Hypertension    Past Surgical History:  Procedure Laterality Date   APPENDECTOMY     arm surgery     right arm 2006   TONSILLECTOMY      Allergies  Allergies  Allergen Reactions   Sulfa Antibiotics Anaphylaxis and Swelling    66 years old, Hospitalized for 2 weeks, unknown    History of Present Illness    Lawrence Henderson has a PMH of HTN, HLD, chest discomfort, obesity, hyperglycemia, prediabetes, and transaminitis.  His PMH also includes mild nonobstructive CAD and chronic dyspnea following COVID-pneumonia.  He had a negative stress test in 2011 and 2018.  He had ongoing precordial pain.  A coronary CTA 7/22 showed minimal-mild nonobstructive coronary disease with a calcium score of 30 which placed him in the 40th percentile for age and sex matched control.  His echocardiogram 3/22 showed an EF of 60 to 65%, mild LVH, mild LAE and no significant valvular abnormalities.  He was noted to have chronic dyspnea in the setting of previous COVID-pneumonia.  He was seen in follow-up on 04/20/2021 and continued to be stable from a cardiac standpoint.  He was seen in follow-up by Diona Browner NP-C on 08/03/2021.  During that time he remained stable from a cardiac standpoint.  He noted occasional mild tingling in his bilateral arms while at rest and with exertion.  He noted the sensation has been present since he had COVID.  He continued to note mild chronic dyspnea with exertion which was unchanged.  He denied anginal type symptoms.  He was enjoying retirement.  He presents to the  clinic today for follow-up evaluation states over the last few weeks he has noted intermittent episodes of chest tightness and palpitations.  He reports that during the episodes his breathing becomes more labored.  They have been happening intermittently and last for 15 to 20 minutes at a time.  They dissipate with increased physical activity.  We reviewed his previous coronary CTA and COVID infection.  He also notes that he has had flashes in his left eye during the episode.  He reports that he drinks 1 diet [facility] in the morning, a diet Sprite in the afternoon, some green tea and that his wife tells him that he does not stay well-hydrated.  I will order an echocardiogram, 14-day cardiac event monitor, magnesium, BMP, CBC, have him follow-up with optometry for his vision and plan follow-up in 6 to 8 weeks.  Of also asked him to increase his p.o. hydration.  We will have him avoid triggers for palpitations.  Today he denies chest pain,  lower extremity edema, fatigue, palpitations, melena, hematuria, hemoptysis, diaphoresis, weakness, presyncope, syncope, orthopnea, and PND.    Home Medications    Prior to Admission medications   Medication Sig Start Date End Date Taking? Authorizing Provider  amLODipine (NORVASC) 10 MG tablet Take 1 tablet (10 mg total) by mouth daily. 11/04/21 02/02/22  Lenna Sciara, NP  aspirin EC 81 MG tablet Take 81 mg by mouth daily.    [provider]  co-enzyme Q-10 30 MG capsule Take 30 mg by mouth daily.     [provider]  ezetimibe (ZETIA) 10 MG tablet Take 1 tablet (10 mg total) by mouth daily. 11/04/21   Lenna Sciara, NP  famotidine (PEPCID) 20 MG tablet Take 20 mg by mouth daily.    [provider]  hydrochlorothiazide (HYDRODIURIL) 25 MG tablet Take 1 tablet (25 mg total) by mouth daily. 07/24/21   Elouise Munroe, MD  lisinopril (PRINIVIL,ZESTRIL) 20 MG tablet Take 20 mg by mouth daily. 01/13/15   [provider]  Multiple  Vitamin (MULTIVITAMIN WITH MINERALS) TABS tablet Take 1 tablet by mouth daily.    [provider]  rosuvastatin (CRESTOR) 20 MG tablet Take 1 tablet (20 mg total) by mouth daily. 03/23/21   Elouise Munroe, MD  vitamin C (ASCORBIC ACID) 500 MG tablet Take 500 mg by mouth daily.    [provider]  zinc gluconate 50 MG tablet Take 50 mg by mouth daily.    [provider]    Family History    Family History  Problem Relation Age of Onset   Lung cancer Mother    Atrial fibrillation Mother    Heart attack Father    Diabetes Mellitus II Paternal Grandfather    Diabetes Paternal Grandfather    He indicated that the status of his mother is unknown. He indicated that his father is deceased. He indicated that the status of his paternal grandfather is unknown.  Social History    Social History   Socioeconomic History   Marital status: Married    Spouse name: Not on file   Number of children: Not on file   Years of education: Not on file   Highest education level: Not on file  Occupational History   Not on file  Tobacco Use   Smoking status: Former    Packs/day: 0.25    Types: Cigarettes    Quit date: 39    Years since quitting: 48.8   Smokeless tobacco: Never   Tobacco comments:    smoked for about a month or two  Vaping Use   Vaping Use: Never used  Substance and Sexual Activity   Alcohol use: Yes   Drug use: No    Comment: Quit when he was 89   Sexual activity: Not on file  Other Topics Concern   Not on file  Social History Narrative   Not on file   Social Determinants of Health   Financial Resource Strain: Not on file  Food Insecurity: Not on file  Transportation Needs: Not on file  Physical Activity: Not on file  Stress: Not on file  Social Connections: Not on file  Intimate Partner Violence: Not on file     Review of Systems    General:  No chills, fever, night sweats or weight changes.  Cardiovascular:  No chest pain, dyspnea  on exertion, edema, orthopnea, palpitations, paroxysmal nocturnal dyspnea. Dermatological: No rash, lesions/masses Respiratory: No cough, dyspnea Urologic: No hematuria, dysuria Abdominal:   No nausea, vomiting, diarrhea, bright red blood per rectum, melena, or hematemesis Neurologic:  No visual changes, wkns, changes in mental status. All other systems reviewed and are otherwise negative except as noted above.  Physical Exam    VS:  BP 126/80 (BP Location: Left Arm, Patient Position: Sitting, Cuff Size: Large)   Pulse 90   Ht '5\' 11"'$  (1.803 m)   Wt 251 lb (113.9 kg)   BMI 35.01 kg/m  ,  BMI Body mass index is 35.01 kg/m. GEN: Well nourished, well developed, in no acute distress. HEENT: normal. Neck: Supple, no JVD, carotid bruits, or masses. Cardiac: RRR, no murmurs, rubs, or gallops. No clubbing, cyanosis, edema.  Radials/DP/PT 2+ and equal bilaterally.  Respiratory:  Respirations regular and unlabored, clear to auscultation bilaterally. GI: Soft, nontender, nondistended, BS + x 4. MS: no deformity or atrophy. Skin: warm and dry, no rash. Neuro:  Strength and sensation are intact. Psych: Normal affect.  Accessory Clinical Findings    Recent Labs: 04/20/2021: BUN 15; Creatinine, Ser 0.96; Potassium 4.3; Sodium 139   Recent Lipid Panel    Component Value Date/Time   CHOL 136 11/03/2021 0926   TRIG 112 11/03/2021 0926   HDL 53 11/03/2021 0926   CHOLHDL 2.6 11/03/2021 0926   CHOLHDL 2.9 01/14/2021 0203   VLDL 35 01/14/2021 0203   LDLCALC 63 11/03/2021 0926         ECG personally reviewed by me today-sinus rhythm with occasional premature ventricular complexes nonspecific T wave abnormality 90 bpm- No acute changes  Coronary CTA 01/14/2021  FINDINGS: Quality: Very good, HR 62, attenuation artifact   Coronary calcium score: The patient's coronary artery calcium score is 30, which places the patient in the 40th percentile.   Coronary arteries: Normal coronary  origins.  Right dominance.   Right Coronary Artery: Dominant. Minimal 1-24% mixed distal vessel stenosis (CADRADS1). Normal R-PDA and R-PLB branches.   Left Main Coronary Artery: Normal. Bifurcates into the LAD and LCx arteries.   Left Anterior Descending Coronary Artery: Anterior vessel which reaches the apex. There is a mild 25-49% eccentric non-calcified stenosis of the proximal vessel, just prior to a small D1 takeoff (CADRADS2). The D1 branch demonstrates minimal 1-24% proximal mixed stenoses (CADRADS1).   Left Circumflex Artery: Smaller AV groove vessel without disease. Larger high OM1 branch with minimal non-calcified proximal stenosis (1-24%, CADRADS1).   Aorta: Normal size, 31 mm at the mid ascending aorta (level of the PA bifurcation) measured double oblique. No calcifications. No dissection.   Aortic Valve: Trileaflet.  No calcifications.   Other findings:   Normal pulmonary vein drainage into the left atrium.   Normal left atrial appendage without a thrombus.   Normal size of the pulmonary artery.   IMPRESSION: 1. 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   2. Coronary calcium score of 30. This was 40th percentile for age and sex matched control.   3. Normal coronary origin with right dominance.   4. Aggressive cardiovascular risk factor modification is recommended.   Electronically Signed: By: Pixie Casino M.D. On: 01/14/2021 11:12 Assessment & Plan   1.  Palpitations-has noticed over the last couple weeks he has had episodes of fast heartbeat and increased dyspnea.  Episodes are lasting for about 15 to 20 minutes and dissipate with increased physical activity.  Episodes come on at rest.  Reports poor hydration. 14-day cardiac event monitor Echocardiogram Magnesium, CBC, BMP Increase p.o. hydration Avoid triggers  Coronary artery disease-no chest pain today.  Underwent coronary CTA 7/22 which showed minimal-mild nonobstructive CAD  calcium score of 30.  Maintain physical activity.  Continues to note occasional intermittent periods of sharp chest discomfort which appears to be precordial type pain. Continue amlodipine, aspirin, ezetimibe, HCTZ, rosuvastatin Heart healthy low-sodium diet-salty 6 given Increase physical activity as tolerated Reassured that his pain is not related to cardiac issues.  Hyperlipidemia LDL 63 on 11/03/2021 Continue rosuvastatin, aspirin, ezetimibe Heart healthy low-sodium diet-salty 6  given Increase physical activity as tolerated   Essential hypertension-BP today 126/80.  Notes somewhat labile blood pressure during recent episodes of accelerated heart rate. Continue lisinopril, HCTZ, amlodipine Heart healthy low-sodium diet-salty 6 given Increase physical activity as tolerated Cardiac event monitor  Dyspnea, DOE-chronic.  Increased labored breathing with recent episodes.  Staying physically active.  Present since having COVID infection. Order echocardiogram Maintain physical activity  Vision changes-has noticed flashes in his vision with recent episodes of increased heart rate. Follow-up with optometrist  Disposition: Follow-up with Dr. Margaretann Loveless or me in 6 to 8 weeks.   Jossie Ng. Donyae Kilner NP-C     04/09/2022, 8:30 AM [address] Office [phone] Fax [phone]  Notice: This dictation was prepared with Dragon dictation along with smaller phrase technology. Any transcriptional errors that result from this process are unintentional and may not be corrected upon review.  I spent 14 minutes examining this patient, reviewing medications, and using patient centered shared decision making involving her cardiac care.  Prior to her visit I spent greater than 20 minutes reviewing her past medical history,  medications, and prior cardiac tests.

## 2022-04-09 ENCOUNTER — Ambulatory Visit (INDEPENDENT_AMBULATORY_CARE_PROVIDER_SITE_OTHER): Payer: Medicare Other

## 2022-04-09 ENCOUNTER — Encounter: Payer: Self-pay | Admitting: General Practice

## 2022-04-09 ENCOUNTER — Ambulatory Visit: Payer: Medicare Other | Attending: General Practice | Admitting: General Practice

## 2022-04-09 VITALS — BP 126/80 | HR 90 | Ht 71.0 in | Wt 251.0 lb

## 2022-04-09 DIAGNOSIS — I10 Essential (primary) hypertension: Secondary | ICD-10-CM | POA: Diagnosis not present

## 2022-04-09 DIAGNOSIS — H43812 Vitreous degeneration, left eye: Secondary | ICD-10-CM | POA: Diagnosis not present

## 2022-04-09 DIAGNOSIS — R002 Palpitations: Secondary | ICD-10-CM | POA: Diagnosis not present

## 2022-04-09 DIAGNOSIS — I2510 Atherosclerotic heart disease of native coronary artery without angina pectoris: Secondary | ICD-10-CM

## 2022-04-09 DIAGNOSIS — R0609 Other forms of dyspnea: Secondary | ICD-10-CM | POA: Diagnosis not present

## 2022-04-09 DIAGNOSIS — E785 Hyperlipidemia, unspecified: Secondary | ICD-10-CM

## 2022-04-09 NOTE — Patient Instructions (Signed)
Medication Instructions:  The current medical regimen is effective;  continue present plan and medications as directed. Please refer to the Current Medication list given to you today.  *If you need a refill on your cardiac medications before your next appointment, please call your pharmacy*   Lab Work: CBC, BMET AND MAG TODAY If you have labs (blood work) drawn today and your tests are completely normal, you will receive your results only by:  [place] (if you have MyChart) OR A paper copy in the mail  If you have any lab test that is abnormal or we need to change your treatment, we will call you to review the results.  Testing/Procedures: Echocardiogram - Your physician has requested that you have an echocardiogram. Echocardiography is a painless test that uses sound waves to create images of your heart. It provides your doctor with information about the size and shape of your heart and how well your heart's chambers and valves are working. This procedure takes approximately one hour. There are no restrictions for this procedure.    Other Instructions Your physician has requested you wear a ZIO patch monitor for 14 days.   MAKE APPOINTMENT WITH YOUR OPTOMETRIST  INCREASE HYDRATION 5 OZ-WATER IS PREFERRED   Follow-Up: At [facility], you and your health needs are our priority.  As part of our continuing mission to provide you with exceptional heart care, we have created designated Provider Care Teams.  These Care Teams include your primary Cardiologist (physician) and Advanced Practice Providers (APPs -  Physician Assistants and Nurse Practitioners) who all work together to provide you with the care you need, when you need it.  Your next appointment:   6-8 week(s)  The format for your next appointment:   In Person  Provider:   Elouise Munroe, MD  or Coletta Memos, FNP       Important Information About [place]       [place] Term Monitor  Instructions  Your physician has requested you wear a ZIO patch monitor for 14 days.  This is a single patch monitor. [employer] supplies one patch monitor per enrollment. Additional stickers are not available. Please do not apply patch if you will be having a Nuclear Stress Test,  Echocardiogram, Cardiac CT, MRI, or Chest Xray during the period you would be wearing the  monitor. The patch cannot be worn during these tests. You cannot remove and re-apply the  ZIO XT patch monitor.  Your ZIO patch monitor will be mailed 3 day USPS to your address on file. It may take 3-5 days  to receive your monitor after you have been enrolled.  Once you have received your monitor, please review the enclosed instructions. Your monitor  has already been registered assigning a specific monitor serial # to you.  Billing and Patient Assistance Program Information  We have supplied [employer] with any of your insurance information on file for billing purposes. [employer] offers a sliding scale Patient Assistance Program for patients that do not have  insurance, or whose insurance does not completely cover the cost of the ZIO monitor.  You must apply for the Patient Assistance Program to qualify for this discounted rate.  To apply, please call [employer] at [phone], select option 4, select option 2, ask to apply for  Patient Assistance Program. Theodore Demark will ask your household income, and how many people  are in your household. They will quote your out-of-pocket cost based on that information.  [employer] will also  be able to set up a 34-month interest-free payment plan if needed.  Applying the monitor   Shave hair from upper left chest.  Hold abrader disc by orange tab. Rub abrader in 40 strokes over the upper left chest as  indicated in your monitor instructions.  Clean area with 4 enclosed alcohol pads. Let dry.  Apply patch as indicated in monitor instructions. Patch will be placed under collarbone on left  side  of chest with arrow pointing upward.  Rub patch adhesive wings for 2 minutes. Remove white label marked "1". Remove the white  label marked "2". Rub patch adhesive wings for 2 additional minutes.  While looking in a mirror, press and release button in center of patch. A small green light will  flash 3-4 times. This will be your only indicator that the monitor has been turned on.  Do not shower for the first 24 hours. You may shower after the first 24 hours.  Press the button if you feel a symptom. You will hear a small click. Record Date, Time and  Symptom in the Patient Logbook.  When you are ready to remove the patch, follow instructions on the last 2 pages of Patient  Logbook. Stick patch monitor onto the last page of Patient Logbook.  Place Patient Logbook in the blue and white box. Use locking tab on box and tape box closed  securely. The blue and white box has prepaid postage on it. Please place it in the mailbox as  soon as possible. Your physician should have your test results approximately 7 days after the  monitor has been [facility]  Call [place] [phone] you have questions regarding  your ZIO XT patch monitor. Call them immediately if you see an orange light blinking on your  monitor.  If your monitor falls off in less than 4 days, contact our Monitor department at [phone]  If your monitor becomes loose or falls off after 4 days call [employer] at [phone]  suggestions on securing your monitor

## 2022-04-09 NOTE — Progress Notes (Unsigned)
ZIO XT mailed to pt's home address. 

## 2022-04-10 LAB — CBC
Hematocrit: 43.2 % (ref 37.5–51.0)
Hemoglobin: 15.2 g/dL (ref 13.0–17.7)
MCH: 30.5 pg (ref 26.6–33.0)
MCHC: 35.2 g/dL (ref 31.5–35.7)
MCV: 87 fL (ref 79–97)
Platelets: 215 10*3/uL (ref 150–450)
RBC: 4.98 x10E6/uL (ref 4.14–5.80)
RDW: 12.4 % (ref 11.6–15.4)
WBC: 8 10*3/uL (ref 3.4–10.8)

## 2022-04-10 LAB — BASIC METABOLIC PANEL
BUN/Creatinine Ratio: 21 (ref 10–24)
BUN: 16 mg/dL (ref 8–27)
CO2: 24 mmol/L (ref 20–29)
Calcium: 9.3 mg/dL (ref 8.6–10.2)
Chloride: 99 mmol/L (ref 96–106)
Creatinine, Ser: 0.78 mg/dL (ref 0.76–1.27)
Glucose: 97 mg/dL (ref 70–99)
Potassium: 4.1 mmol/L (ref 3.5–5.2)
Sodium: 139 mmol/L (ref 134–144)
eGFR: 99 mL/min/{1.73_m2} (ref >59)

## 2022-04-10 LAB — MAGNESIUM: Magnesium: 2.2 mg/dL (ref 1.6–2.3)

## 2022-04-13 DIAGNOSIS — R002 Palpitations: Secondary | ICD-10-CM | POA: Diagnosis not present

## 2022-04-13 NOTE — Addendum Note (Signed)
Addended by: Waylan Rocher on: 04/13/2022 07:36 AM   Modules accepted: Orders

## 2022-04-19 ENCOUNTER — Telehealth: Payer: Self-pay | Admitting: Internal Medicine

## 2022-04-19 MED ORDER — HYDROCHLOROTHIAZIDE 25 MG PO TABS: 25.0000 mg | ORAL_TABLET | Freq: Every day | ORAL | 2 refills | Status: DC

## 2022-04-19 NOTE — Telephone Encounter (Signed)
 *  STAT* If patient is at the pharmacy, call can be transferred to refill team.   1. Which medications need to be refilled? (please list name of each medication and dose if known) hydrochlorothiazide (HYDRODIURIL) 25 MG tablet  2. Which pharmacy/location (including street and city if local pharmacy) is medication to be sent to? [id number] [place] 3. Do they need a 30 day or 90 day supply? 90 days  Pt is out of meds

## 2022-04-21 ENCOUNTER — Ambulatory Visit (HOSPITAL_COMMUNITY): Payer: Medicare Other | Attending: Internal Medicine

## 2022-04-21 DIAGNOSIS — R0609 Other forms of dyspnea: Secondary | ICD-10-CM | POA: Diagnosis not present

## 2022-04-21 LAB — ECHOCARDIOGRAM COMPLETE
Area-P 1/2: 3.77 cm2
S' Lateral: 2.5 cm

## 2022-04-29 DIAGNOSIS — H4720 Unspecified optic atrophy: Secondary | ICD-10-CM | POA: Diagnosis not present

## 2022-04-29 DIAGNOSIS — H43811 Vitreous degeneration, right eye: Secondary | ICD-10-CM | POA: Diagnosis not present

## 2022-05-06 DIAGNOSIS — R002 Palpitations: Secondary | ICD-10-CM | POA: Diagnosis not present

## 2022-05-07 ENCOUNTER — Telehealth: Payer: Self-pay | Admitting: General Practice

## 2022-05-07 NOTE — Telephone Encounter (Signed)
Patient asked for the heart monitor results. Explained he will be contacted once they are resulted.

## 2022-05-07 NOTE — Telephone Encounter (Signed)
Pt calling to see if we have heart monitor results

## 2022-05-11 ENCOUNTER — Telehealth: Payer: Self-pay | Admitting: Internal Medicine

## 2022-05-11 MED ORDER — METOPROLOL TARTRATE 25 MG PO TABS: 25.0000 mg | ORAL_TABLET | Freq: Two times a day (BID) | ORAL | 1 refills | Status: DC

## 2022-05-11 NOTE — Telephone Encounter (Signed)
Spoke with pt regarding monitor results. Pt states that he would really like to know results soon so that he knows what is going on with his heart. Pt states that he sent monitor back on Oct. 31st and called on 11/10 to get results. Will forward to [place] to result. Pt verbalizes understanding.

## 2022-05-11 NOTE — Telephone Encounter (Signed)
Lawrence Munroe, MD  You; Lawrence Driver, RN; Deberah Pelton, NP    Monitor low risk, with ectopy and brief SVT. Recommend hydration and low dose beta blocker, start metoprolol tartrate 25 mg BID and follow up with me if symptoms do not improve within a few weeks. Next routine follow up in my clinic, 6 mo.   Spoke with pt regarding Dr. Delphina Cahill recommendations. Pt is agreeable to plan. Metoprolol tartrate '25mg'$  BID sent to pt's pharmacy of choice. Pt already has follow up scheduled with Denyse Amass on 12/5. Will discuss how medication is working at that appointment. Pt verbalizes understanding.

## 2022-05-11 NOTE — Telephone Encounter (Signed)
Patient called to follow-up on heart monitor results.

## 2022-05-24 NOTE — Progress Notes (Unsigned)
Cardiology Clinic Note   Patient Name: Lawrence Henderson Date of Encounter: 06/01/2022  Primary Care Provider:  Mayra Neer, MD Primary Cardiologist:  Elouise Munroe, MD  Patient Profile    Lawrence Henderson 66 year old male presents to the clinic today for follow-up evaluation of his hypertension ,hyperlipidemia, and to review results of cardiac event monitor.  Past Medical History    Past Medical History:  Diagnosis Date   Hyperlipidemia    Hypertension    Past Surgical History:  Procedure Laterality Date   APPENDECTOMY     arm surgery     right arm 2006   TONSILLECTOMY      Allergies  Allergies  Allergen Reactions   Sulfa Antibiotics Anaphylaxis and Swelling    66 years old, Hospitalized for 2 weeks, unknown    History of Present Illness    Lawrence Henderson has a PMH of HTN, HLD, chest discomfort, obesity, hyperglycemia, prediabetes, and transaminitis.  His PMH also includes mild nonobstructive CAD and chronic dyspnea following COVID-pneumonia.  He had a negative stress test in 2011 and 2018.  He had ongoing precordial pain.  A coronary CTA 7/22 showed minimal-mild nonobstructive coronary disease with a calcium score of 30 which placed him in the 40th percentile for age and sex matched control.  His echocardiogram 3/22 showed an EF of 60 to 65%, mild LVH, mild LAE and no significant valvular abnormalities.  He was noted to have chronic dyspnea in the setting of previous COVID-pneumonia.  He was seen in follow-up on 04/20/2021 and continued to be stable from a cardiac standpoint.  He was seen in follow-up by Diona Browner NP-C on 08/03/2021.  During that time he remained stable from a cardiac standpoint.  He noted occasional mild tingling in his bilateral arms while at rest and with exertion.  He noted the sensation has been present since he had COVID.  He continued to note mild chronic dyspnea with exertion which was unchanged.  He denied anginal type symptoms.  He  was enjoying retirement.  He presented to the clinic 04/09/22 for follow-up evaluation stated over the last few weeks he had noted intermittent episodes of chest tightness and palpitations.  He reported that during the episodes his breathing became more labored.  They had been happening intermittently and lasted for 15 to 20 minutes at a time.  The episodes dissipated with increased physical activity.  We reviewed his previous coronary CTA and COVID infection.  He also noted that he  had flashes in his left eye during the episode.  He reported that he drank 1 diet [facility] in the morning, a diet Sprite in the afternoon, some green tea.  I  ordered an echocardiogram, 14-day cardiac event monitor, magnesium, BMP, CBC, had him follow-up with optometry for his vision and planned follow-up in 6 to 8 weeks.  I also asked him to increase his p.o. hydration.  We will have him avoid triggers for palpitations.  His cardiac event monitor showed brief episodes of SVT, was low risk, and isolated SVE's.  Dr. Margaretann Loveless prescribed metoprolol tartrate 25 mg twice daily.  His echocardiogram showed normal LVEF, mild left ventricular hypertrophy, no significant valvular abnormalities and no significant changes from his previous study.  He presents to the clinic today for follow-up evaluation and states he has noticed some increased fatigue with starting metoprolol.  He does note fewer palpitations.  He continues to be fairly physically active walking most days of the week.  He continues to avoid triggers and has been working on increasing his p.o. hydration.  We reviewed his echocardiogram and his cardiac event monitor.  He and his wife expressed understanding.  I will decrease his metoprolol to 12.5 mg in the a.m. and continue 25 mg in p.m.  We will maintain his p.o. hydration, have him maintain his physical activity and plan follow-up as scheduled with Dr. Margaretann Loveless.  Today he denies chest pain,  lower extremity edema,  fatigue,  melena, hematuria, hemoptysis, diaphoresis, weakness, presyncope, syncope, orthopnea, and PND.     Home Medications    Prior to Admission medications   Medication Sig Start Date End Date Taking? Authorizing Provider  amLODipine (NORVASC) 10 MG tablet Take 1 tablet (10 mg total) by mouth daily. 11/04/21 02/02/22  Lenna Sciara, NP  aspirin EC 81 MG tablet Take 81 mg by mouth daily.    [provider]  co-enzyme Q-10 30 MG capsule Take 30 mg by mouth daily.     [provider]  ezetimibe (ZETIA) 10 MG tablet Take 1 tablet (10 mg total) by mouth daily. 11/04/21   Lenna Sciara, NP  famotidine (PEPCID) 20 MG tablet Take 20 mg by mouth daily.    [provider]  hydrochlorothiazide (HYDRODIURIL) 25 MG tablet Take 1 tablet (25 mg total) by mouth daily. 07/24/21   Elouise Munroe, MD  lisinopril (PRINIVIL,ZESTRIL) 20 MG tablet Take 20 mg by mouth daily. 01/13/15   [provider]  Multiple Vitamin (MULTIVITAMIN WITH MINERALS) TABS tablet Take 1 tablet by mouth daily.    [provider]  rosuvastatin (CRESTOR) 20 MG tablet Take 1 tablet (20 mg total) by mouth daily. 03/23/21   Elouise Munroe, MD  vitamin C (ASCORBIC ACID) 500 MG tablet Take 500 mg by mouth daily.    [provider]  zinc gluconate 50 MG tablet Take 50 mg by mouth daily.    [provider]    Family History    Family History  Problem Relation Age of Onset   Lung cancer Mother    Atrial fibrillation Mother    Heart attack Father    Diabetes Mellitus II Paternal Grandfather    Diabetes Paternal Grandfather    He indicated that the status of his mother is unknown. He indicated that his father is deceased. He indicated that the status of his paternal grandfather is unknown.  Social History    Social History   Socioeconomic History   Marital status: Married    Spouse name: Not on file   Number of children: Not on file   Years of education: Not on  file   Highest education level: Not on file  Occupational History   Not on file  Tobacco Use   Smoking status: Former    Packs/day: 0.25    Types: Cigarettes    Quit date: 63    Years since quitting: 48.9   Smokeless tobacco: Never   Tobacco comments:    smoked for about a month or two  Vaping Use   Vaping Use: Never used  Substance and Sexual Activity   Alcohol use: Yes   Drug use: No    Comment: Quit when he was 68   Sexual activity: Not on file  Other Topics Concern   Not on file  Social History Narrative   Not on file   Social Determinants of Health   Financial Resource Strain: Not on file  Food Insecurity: Not on file  Transportation Needs: Not on file  Physical Activity: Not on file  Stress: Not on file  Social Connections: Not on file  Intimate Partner Violence: Not on file     Review of Systems    General:  No chills, fever, night sweats or weight changes.  Cardiovascular:  No chest pain, dyspnea on exertion, edema, orthopnea, palpitations, paroxysmal nocturnal dyspnea. Dermatological: No rash, lesions/masses Respiratory: No cough, dyspnea Urologic: No hematuria, dysuria Abdominal:   No nausea, vomiting, diarrhea, bright red blood per rectum, melena, or hematemesis Neurologic:  No visual changes, wkns, changes in mental status. All other systems reviewed and are otherwise negative except as noted above.  Physical Exam    VS:  BP 112/76   Pulse 64   Ht '5\' 11"'$  (1.803 m)   Wt 248 lb 12.8 oz (112.9 kg)   SpO2 93%   BMI 34.70 kg/m  , BMI Body mass index is 34.7 kg/m. GEN: Well nourished, well developed, in no acute distress. HEENT: normal. Neck: Supple, no JVD, carotid bruits, or masses. Cardiac: RRR, no murmurs, rubs, or gallops. No clubbing, cyanosis, edema.  Radials/DP/PT 2+ and equal bilaterally.  Respiratory:  Respirations regular and unlabored, clear to auscultation bilaterally. GI: Soft, nontender, nondistended, BS + x 4. MS: no deformity or  atrophy. Skin: warm and dry, no rash. Neuro:  Strength and sensation are intact. Psych: Normal affect.  Accessory Clinical Findings    Recent Labs: 04/09/2022: BUN 16; Creatinine, Ser 0.78; Hemoglobin 15.2; Magnesium 2.2; Platelets 215; Potassium 4.1; Sodium 139   Recent Lipid Panel    Component Value Date/Time   CHOL 136 11/03/2021 0926   TRIG 112 11/03/2021 0926   HDL 53 11/03/2021 0926   CHOLHDL 2.6 11/03/2021 0926   CHOLHDL 2.9 01/14/2021 0203   VLDL 35 01/14/2021 0203   LDLCALC 63 11/03/2021 0926         ECG personally reviewed by me today-none today.  EKG 04/09/2022 sinus rhythm with occasional premature ventricular complexes nonspecific T wave abnormality 90 bpm- No acute changes  Coronary CTA 01/14/2021  FINDINGS: Quality: Very good, HR 62, attenuation artifact   Coronary calcium score: The patient's coronary artery calcium score is 30, which places the patient in the 40th percentile.   Coronary arteries: Normal coronary origins.  Right dominance.   Right Coronary Artery: Dominant. Minimal 1-24% mixed distal vessel stenosis (CADRADS1). Normal R-PDA and R-PLB branches.   Left Main Coronary Artery: Normal. Bifurcates into the LAD and LCx arteries.   Left Anterior Descending Coronary Artery: Anterior vessel which reaches the apex. There is a mild 25-49% eccentric non-calcified stenosis of the proximal vessel, just prior to a small D1 takeoff (CADRADS2). The D1 branch demonstrates minimal 1-24% proximal mixed stenoses (CADRADS1).   Left Circumflex Artery: Smaller AV groove vessel without disease. Larger high OM1 branch with minimal non-calcified proximal stenosis (1-24%, CADRADS1).   Aorta: Normal size, 31 mm at the mid ascending aorta (level of the PA bifurcation) measured double oblique. No calcifications. No dissection.   Aortic Valve: Trileaflet.  No calcifications.   Other findings:   Normal pulmonary vein drainage into the left atrium.    Normal left atrial appendage without a thrombus.   Normal size of the pulmonary artery.   IMPRESSION: 1. 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   2. Coronary calcium score of 30. This was 40th percentile for age and sex matched control.   3. Normal coronary origin with right dominance.   4. Aggressive cardiovascular  risk factor modification is recommended.   Electronically Signed: By: Pixie Casino M.D. On: 01/14/2021 11:12  Cardiac event monitor 05/06/2022 Patient had a min HR of 62 bpm, max HR of 218 bpm, and avg HR of 84 bpm. Predominant underlying rhythm was Sinus Rhythm. 3 Supraventricular Tachycardia runs occurred, the run with the fastest interval lasting 11 beats with a max rate of 218 bpm, the  longest lasting 12 beats with an avg rate of 111 bpm. Isolated SVEs were rare (<1.0%), SVE Couplets were rare (<1.0%), and SVE Triplets were rare (<1.0%). Isolated VEs were rare (<1.0%), VE Couplets were rare (<1.0%), and no VE Triplets were present.  Ventricular Bigeminy was present.   Echocardiogram 04/21/2022  IMPRESSIONS     1. Left ventricular ejection fraction, by estimation, is 65%. The left  ventricle has normal function. The left ventricle has no regional wall  motion abnormalities. There is mild asymmetric left ventricular  hypertrophy of the basal-septal segment. Left  ventricular diastolic parameters were normal. The average left ventricular  global longitudinal strain is -27.0 %. The global longitudinal strain is  normal.   2. Right ventricular systolic function is normal. The right ventricular  size is normal. Tricuspid regurgitation signal is inadequate for assessing  PA pressure.   3. The mitral valve is grossly normal. No evidence of mitral valve  regurgitation. No evidence of mitral stenosis.   4. The aortic valve is tricuspid. Aortic valve regurgitation is not  visualized. No aortic stenosis is present.   Comparison(s): No significant  change from prior study. Prior images  reviewed side by side.   FINDINGS   Left Ventricle: Left ventricular ejection fraction, by estimation, is  65%. The left ventricle has normal function. The left ventricle has no  regional wall motion abnormalities. The average left ventricular global  longitudinal strain is -27.0 %. The  global longitudinal strain is normal. The left ventricular internal cavity  size was normal in size. There is mild asymmetric left ventricular  hypertrophy of the basal-septal segment. Left ventricular diastolic  parameters were normal.   Right Ventricle: The right ventricular size is normal. No increase in  right ventricular wall thickness. Right ventricular systolic function is  normal. Tricuspid regurgitation signal is inadequate for assessing PA  pressure.   Left Atrium: Left atrial size was normal in size.   Right Atrium: Right atrial size was normal in size.   Pericardium: Trivial pericardial effusion is present. Presence of  epicardial fat layer.   Mitral Valve: The mitral valve is grossly normal. No evidence of mitral  valve regurgitation. No evidence of mitral valve stenosis.   Tricuspid Valve: The tricuspid valve is normal in structure. Tricuspid  valve regurgitation is not demonstrated. No evidence of tricuspid  stenosis.   Aortic Valve: The aortic valve is tricuspid. There is mild aortic valve  annular calcification. Aortic valve regurgitation is not visualized. No  aortic stenosis is present.   Pulmonic Valve: The pulmonic valve was not well visualized. Pulmonic valve  regurgitation is trivial. No evidence of pulmonic stenosis.   Aorta: The aortic root is normal in size and structure.   IAS/Shunts: The interatrial septum was not well visualized.  Assessment & Plan   1.  Palpitations-previously noted episodes of fast heartbeat and increased dyspnea.  Episodes are lasting for about 15 to 20 minutes and dissipate with increased physical  activity.  Episodes come on at rest.  Cardiac event monitor showed brief episodes of SVT and isolated SVE's.  Dr.  Margaretann Loveless started metoprolol tartrate 25 twice daily.  Echocardiogram unchanged from previous study. Decrease metoprolol 12 point 5 AM and 25 mg p.m. Increase physical activity as tolerated Maintain p.o. hydration May take B complex vitamin Avoid triggers  Coronary artery disease-no recent episodes of chest discomfort.  Coronary CTA 7/22 which showed minimal-mild nonobstructive CAD calcium score of 30.  Maintain physical activity.  Continues to note occasional intermittent periods of sharp chest discomfort which appears to be precordial type pain. Continue amlodipine, aspirin, ezetimibe, HCTZ, rosuvastatin Heart healthy low-sodium diet Increase physical activity as tolerated  Hyperlipidemia LDL 63 on 11/03/2021 Continue rosuvastatin, aspirin, ezetimibe Heart healthy low-sodium high-fiber diet Increase physical activity as tolerated   Essential hypertension-BP today 112/76.  Well-controlled at home.   Continue lisinopril, HCTZ, amlodipine, metoprolol Heart healthy low-sodium diet Increase physical activity as tolerated Cardiac event monitor  Dyspnea, DOE-chronic, stable.  Continues to stay  physically active.  Present since having COVID infection. Repeat echocardiogram unchanged from previous study. Maintain physical activity   Disposition: Follow-up with Dr. Margaretann Loveless as scheduled.   Jossie Ng. Tujuana Kilmartin NP-C     06/01/2022, 8:37 AM [address] Office [phone] Fax [phone]  Notice: This dictation was prepared with Dragon dictation along with smaller phrase technology. Any transcriptional errors that result from this process are unintentional and may not be corrected upon review.  I spent 14 minutes examining this patient, reviewing medications, and using patient centered shared decision making involving her  cardiac care.  Prior to her visit I spent greater than 20 minutes reviewing her past medical history,  medications, and prior cardiac tests.

## 2022-06-01 ENCOUNTER — Encounter: Payer: Self-pay | Admitting: General Practice

## 2022-06-01 ENCOUNTER — Ambulatory Visit: Payer: Medicare Other | Attending: General Practice | Admitting: General Practice

## 2022-06-01 VITALS — BP 112/76 | HR 64 | Ht 71.0 in | Wt 248.8 lb

## 2022-06-01 DIAGNOSIS — I10 Essential (primary) hypertension: Secondary | ICD-10-CM

## 2022-06-01 DIAGNOSIS — E785 Hyperlipidemia, unspecified: Secondary | ICD-10-CM | POA: Diagnosis not present

## 2022-06-01 DIAGNOSIS — R0609 Other forms of dyspnea: Secondary | ICD-10-CM | POA: Diagnosis not present

## 2022-06-01 DIAGNOSIS — R002 Palpitations: Secondary | ICD-10-CM | POA: Diagnosis not present

## 2022-06-01 DIAGNOSIS — I2510 Atherosclerotic heart disease of native coronary artery without angina pectoris: Secondary | ICD-10-CM | POA: Diagnosis not present

## 2022-06-01 MED ORDER — METOPROLOL TARTRATE 25 MG PO TABS: ORAL_TABLET | ORAL | 1 refills | Status: DC

## 2022-06-01 NOTE — Patient Instructions (Signed)
Medication Instructions:  TAKE YOUR METOPROLOL 12.5 MG-AM(1/2 TAB) AND '25MG'$  PM  *If you need a refill on your cardiac medications before your next appointment, please call your pharmacy*  Lab Work: NONE If you have labs (blood work) drawn today and your tests are completely normal, you will receive your results only by:  [employer] (if you have MyChart) OR A paper copy in the mail If you have any lab test that is abnormal or we need to change your treatment, we will call you to review the results.  Testing/Procedures: NONE  Follow-Up: At [facility], you and your health needs are our priority.  As part of our continuing mission to provide you with exceptional heart care, we have created designated Provider Care Teams.  These Care Teams include your primary Cardiologist (physician) and Advanced Practice Providers (APPs -  Physician Assistants and Nurse Practitioners) who all work together to provide you with the care you need, when you need it.  Your next appointment:   AS SCHEDULED   The format for your next appointment:   In Person  Provider:   Elouise Munroe, MD     Other Instructions MAINTAIN HYDRATION  MAY TAKE B-12  Important Information About Sugar

## 2022-07-09 DIAGNOSIS — I2510 Atherosclerotic heart disease of native coronary artery without angina pectoris: Secondary | ICD-10-CM | POA: Diagnosis not present

## 2022-07-09 DIAGNOSIS — I10 Essential (primary) hypertension: Secondary | ICD-10-CM | POA: Diagnosis not present

## 2022-07-09 DIAGNOSIS — E782 Mixed hyperlipidemia: Secondary | ICD-10-CM | POA: Diagnosis not present

## 2022-07-09 DIAGNOSIS — R7303 Prediabetes: Secondary | ICD-10-CM | POA: Diagnosis not present

## 2022-07-09 LAB — LAB REPORT - SCANNED
A1c: 5.8
EGFR: 97

## 2022-07-20 DIAGNOSIS — H2513 Age-related nuclear cataract, bilateral: Secondary | ICD-10-CM | POA: Diagnosis not present

## 2022-07-20 DIAGNOSIS — H4720 Unspecified optic atrophy: Secondary | ICD-10-CM | POA: Diagnosis not present

## 2022-07-20 DIAGNOSIS — H47013 Ischemic optic neuropathy, bilateral: Secondary | ICD-10-CM | POA: Diagnosis not present

## 2022-07-20 DIAGNOSIS — H524 Presbyopia: Secondary | ICD-10-CM | POA: Diagnosis not present

## 2022-07-20 DIAGNOSIS — H5213 Myopia, bilateral: Secondary | ICD-10-CM | POA: Diagnosis not present

## 2022-08-02 ENCOUNTER — Telehealth: Payer: Self-pay

## 2022-08-02 NOTE — Telephone Encounter (Signed)
Returned call to patient- he states that he is currently taking '25mg'$  in the morning and 12.'5mg'$  in the evening as prescribed at last OV with NP. Advised patient to stop the evening dose for now will discuss further next week at apt. Patient aware and verbalized understanding.

## 2022-08-02 NOTE — Telephone Encounter (Signed)
Received letter from patients Eye Dr. (Dr. Luberta Mutter) who states that patient has a history of nonarteritic  ischemic optic nerve neurophathy. 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 Per Dr. Ellie Lunch patient is at risk for blood pressure drop overnight due to being on Metoprolol Tartrate BID. She states that she usually recommends patients only take BP medications in the mornings. She would like to know if its okay for patient to stop evening dose of Metoprolol to prevent drop in BP over night. Will forward to MD for review.

## 2022-08-02 NOTE — Telephone Encounter (Signed)
Attempted to call patient, left message for patient to call back to office.   Elouise Munroe, MD  You31 minutes ago (3:34 PM)    Ok to hold evening dose of metoprolol, and we will review this further next week at his appt.

## 2022-08-09 ENCOUNTER — Encounter: Payer: Self-pay | Admitting: Internal Medicine

## 2022-08-09 ENCOUNTER — Ambulatory Visit: Payer: Medicare Other | Attending: Internal Medicine | Admitting: Internal Medicine

## 2022-08-09 VITALS — BP 123/78 | HR 74 | Ht 71.0 in | Wt 246.2 lb

## 2022-08-09 DIAGNOSIS — E785 Hyperlipidemia, unspecified: Secondary | ICD-10-CM

## 2022-08-09 DIAGNOSIS — I10 Essential (primary) hypertension: Secondary | ICD-10-CM

## 2022-08-09 DIAGNOSIS — I2510 Atherosclerotic heart disease of native coronary artery without angina pectoris: Secondary | ICD-10-CM

## 2022-08-09 MED ORDER — AMLODIPINE BESYLATE 5 MG PO TABS: 5.0000 mg | ORAL_TABLET | Freq: Every day | ORAL | 3 refills | Status: DC

## 2022-08-09 NOTE — Patient Instructions (Signed)
Medication Instructions:  DECREASE AMLODIPINE TO 48m ONCE DAILY  *If you need a refill on your cardiac medications before your next appointment, please call your pharmacy*  Lab Work: None Ordered At This Time.  If you have labs (blood work) drawn today and your tests are completely normal, you will receive your results only by: [place](if you have MyChart) OR A paper copy in the mail If you have any lab test that is abnormal or we need to change your treatment, we will call you to review the results.  Testing/Procedures: None Ordered At This Time.   Follow-Up: [facility] and [facility] our [facility].  As part of our continuing mission to provide you with exceptional heart care, we have created designated Provider Care Teams.  These Care Teams include your primary Cardiologist (physician) and Advanced Practice Providers (APPs -  Physician Assistants and Nurse Practitioners) who all work together to provide you with the care you need, when you need it.  Your next appointment:   6 month(s)  Provider:   GElouise Munroe MD     How to Take Your Blood Pressure Blood pressure measures how strongly your blood is pressing against the walls of your arteries. Arteries are blood vessels that carry blood from your heart throughout your body. You can take your blood pressure at home with a machine. You may need to check your blood pressure at home: To check if you have high blood pressure (hypertension). To check your blood pressure over time. To make sure your blood pressure medicine is working. Supplies needed: Blood pressure machine, or monitor. A chair to sit in. This should be a chair where you can sit upright with your back supported. Do not sit on a soft couch or an armchair. Table or desk. Small notebook. Pencil or pen. How to prepare Avoid these things for 30 minutes before checking your blood pressure: Having drinks with caffeine in them, such  as coffee or tea. Drinking alcohol. Eating. Smoking. Exercising. Do these things five minutes before checking your blood pressure: Go to the bathroom and pee (urinate). Sit in a chair. Be quiet. Do not talk. How to take your blood pressure Follow the instructions that came with your machine. If you have a digital blood pressure monitor, these may be the instructions: Sit up straight. Place your feet on the floor. Do not cross your ankles or legs. Rest your left arm at the level of your heart. You may rest it on a table, desk, or chair. Pull up your shirt sleeve. Wrap the blood pressure cuff around the upper part of your left arm. The cuff should be 1 inch (2.5 cm) above your elbow. It is best to wrap the cuff around bare skin. Fit the cuff snugly around your arm, but not too tightly. You should be able to place only one finger between the cuff and your arm. Place the cord so that it rests in the bend of your elbow. Press the power button. Sit quietly while the cuff fills with air and loses air. Write down the numbers on the screen. Wait 2-3 minutes and then repeat steps 1-10. What do the numbers mean? Two numbers make up your blood pressure. The first number is called systolic pressure. The second is called diastolic pressure. An example of a blood pressure reading is "120 over 80" (or 120/80). If you are an adult and do not have a medical condition, use this guide to  find out if your blood pressure is normal: Normal First number: below 120. Second number: below 80. Elevated First number: 120-129. Second number: below 80. Hypertension stage 1 First number: 130-139. Second number: 80-89. Hypertension stage 2 First number: 140 or above. Second number: 63 or above. Your blood pressure is above normal even if only the first or only the second number is above normal. Follow these instructions at home: Medicines Take over-the-counter and prescription medicines only as told by your  doctor. Tell your doctor if your medicine is causing side effects. General instructions Check your blood pressure as often as your doctor tells you to. Check your blood pressure at the same time every day. Take your monitor to your next doctor's appointment. Your doctor will: Make sure you are using it correctly. Make sure it is working right. Understand what your blood pressure numbers should be. Keep all follow-up visits. General tips You will need a blood pressure machine or monitor. Your doctor can suggest a monitor. You can buy one at a drugstore or online. When choosing one: Choose one with an arm cuff. Choose one that wraps around your upper arm. Only one finger should fit between your arm and the cuff. Do not choose one that measures your blood pressure from your wrist or finger. Where to find more information American Heart Association: www.heart.org Contact a doctor if: Your blood pressure keeps being high. Your blood pressure is suddenly low. Get help right away if: Your first blood pressure number is higher than 180. Your second blood pressure number is higher than 120. These symptoms may be an emergency. Do not wait to see if the symptoms will go away. Get help right away. Call 911. Summary Check your blood pressure at the same time every day. Avoid caffeine, alcohol, smoking, and exercise for 30 minutes before checking your blood pressure. Make sure you understand what your blood pressure numbers should be. This information is not intended to replace advice given to you by your health care provider. Make sure you discuss any questions you have with your health care provider. Document Revised: 02/26/2021 Document Reviewed: 02/26/2021 Elsevier Patient Education  [employer].

## 2022-09-03 DIAGNOSIS — M25562 Pain in left knee: Secondary | ICD-10-CM | POA: Diagnosis not present

## 2022-09-04 NOTE — Progress Notes (Unsigned)
Cardiology Office Note:    Date:  09/04/2022   ID:  Lawrence Henderson, DOB [birth year], MRN [id number]  PCP:  Mayra Neer, MD  Cardiologist:  Elouise Munroe, MD  Electrophysiologist:  None   Referring MD: Mayra Neer, MD   Chief Complaint/Reason for Referral: Follow-up HTN, HLD  History of Present Illness:    Lawrence Henderson is a 67 y.o. male with a history of chest pain with low risk Myoview in 2011 and 2018, chronic dyspnea since COVID pneumonia in 12/2019, hypertension, hyperlipidemia, and obesity.  Most recently seen by [facility] NP 06/01/22.  Reported palpitations at prior visits, low risk findings of infrequent SVT and PVCs. ***  Prior visits: During his hospitalization for chest pain we performed a coronary CTA demonstrating minimal to mild nonobstructive CAD, coronary calcium score of 31 which is 40th percentile for age and sex matched controls, normal coronary origins, and no significant incidental extracardiac findings, particularly in light of his history of COVID-19 and chronic dyspnea. He has a history of hypertension and takes amlodipine, lisinopril. His primary care doctor uptitrated his amlodipine to 10 mg daily and he was tolerating this well without lower extremity edema or signs of hypotension. We reviewed dietary modifications that can be made to reduce triglycerides, and also reviewed medication changes that can be performed. His LDL was at goal at 63 and he has a HDL of 51.  He did not feel he will be able to make the significant dietary changes needed to aggressively lower his triglycerides.   Past Medical History:  Diagnosis Date   Hyperlipidemia    Hypertension     Past Surgical History:  Procedure Laterality Date   APPENDECTOMY     arm surgery     right arm 2006   TONSILLECTOMY      Current Medications: Current Meds  Medication Sig   aspirin EC 81 MG tablet Take 81 mg by mouth daily.   co-enzyme Q-10 30 MG capsule Take 30 mg by mouth daily.     ezetimibe (ZETIA) 10 MG tablet Take 1 tablet (10 mg total) by mouth daily.   famotidine (PEPCID) 20 MG tablet Take 20 mg by mouth daily.   hydrochlorothiazide (HYDRODIURIL) 25 MG tablet Take 1 tablet (25 mg total) by mouth daily.   lisinopril (PRINIVIL,ZESTRIL) 20 MG tablet Take 20 mg by mouth daily.   metoprolol tartrate (LOPRESSOR) 25 MG tablet Take 0.5 tablets (12.5 mg total) by mouth every morning AND 1 tablet (25 mg total) daily at 2 PM.   Multiple Vitamin (MULTIVITAMIN WITH MINERALS) TABS tablet Take 1 tablet by mouth daily.   rosuvastatin (CRESTOR) 20 MG tablet Take 1 tablet (20 mg total) by mouth daily.   vitamin C (ASCORBIC ACID) 500 MG tablet Take 500 mg by mouth daily.   zinc gluconate 50 MG tablet Take 50 mg by mouth daily.   [DISCONTINUED] amLODipine (NORVASC) 10 MG tablet Take 1 tablet (10 mg total) by mouth daily.     Allergies:   Sulfa antibiotics   Social History   Tobacco Use   Smoking status: Former    Packs/day: 0.25    Types: Cigarettes    Quit date: 1975    Years since quitting: 49.2   Smokeless tobacco: Never   Tobacco comments:    smoked for about a month or two  Vaping Use   Vaping Use: Never used  Substance Use Topics   Alcohol use: Yes   Drug use: No    Comment:  Quit when he was 66     Family History: The patient's family history includes Atrial fibrillation in his mother; Diabetes in his paternal grandfather; Diabetes Mellitus II in his paternal grandfather; Heart attack in his father; Lung cancer in his mother.  ROS:   Please see the history of present illness.  (+) Bilateral LE edema   All other systems reviewed and are negative.  EKGs/Labs/Other Studies Reviewed:    The following studies were reviewed today: Echo 09/15/2020 1. Left ventricular ejection fraction, by estimation, is 60 to 65%. The  left ventricle has normal function. The left ventricle has no regional  wall motion abnormalities. There is mild asymmetric left ventricular   hypertrophy of the basal and septal  segments. Left ventricular diastolic parameters were normal. The average  left ventricular global longitudinal strain is -22.0 %. The global  longitudinal strain is normal.   2. Right ventricular systolic function is normal. The right ventricular  size is normal.   3. Left atrial size was mildly dilated.   4. The mitral valve is normal in structure. Trivial mitral valve  regurgitation. No evidence of mitral stenosis.   5. The aortic valve was not well visualized. Aortic valve regurgitation  is not visualized. No aortic stenosis is present.   6. The inferior vena cava is normal in size with greater than 50%  respiratory variability, suggesting right atrial pressure of 3 mmHg.   EKG:   04/20/2021: NSR, 84 bpm, Non-specific T-wave abnormality 02/17/2021: NSR  I have independently reviewed the images from coronary CTA 01/14/21.  Recent Labs: 04/09/2022: BUN 16; Creatinine, Ser 0.78; Hemoglobin 15.2; Magnesium 2.2; Platelets 215; Potassium 4.1; Sodium 139  Recent Lipid Panel    Component Value Date/Time   CHOL 136 11/03/2021 0926   TRIG 112 11/03/2021 0926   HDL 53 11/03/2021 0926   CHOLHDL 2.6 11/03/2021 0926   CHOLHDL 2.9 01/14/2021 0203   VLDL 35 01/14/2021 0203   LDLCALC 63 11/03/2021 0926    Physical Exam:    VS:  BP 123/78   Pulse 74   Ht '5\' 11"'$  (1.803 m)   Wt 246 lb 3.2 oz (111.7 kg)   SpO2 94%   BMI 34.34 kg/m     Wt Readings from Last 5 Encounters:  08/09/22 246 lb 3.2 oz (111.7 kg)  06/01/22 248 lb 12.8 oz (112.9 kg)  04/09/22 251 lb (113.9 kg)  08/03/21 244 lb 3.2 oz (110.8 kg)  04/20/21 240 lb (108.9 kg)    Constitutional: No acute distress Eyes: sclera non-icteric, normal conjunctiva and lids ENMT: normal dentition, moist mucous membranes Cardiovascular: regular rhythm, normal rate, no murmurs. S1 and S2 normal. Radial pulses normal bilaterally. No jugular venous distention.  Respiratory: clear to auscultation  bilaterally GI : normal bowel sounds, soft and nontender. No distention.   MSK: extremities warm, well perfused. No edema.  NEURO: grossly nonfocal exam, moves all extremities. PSYCH: alert and oriented x 3, normal mood and affect.   ASSESSMENT:    No diagnosis found.   PLAN:    HLD - Lipids overall well managed, continued Crestor at 20 mg daily as he did not tolerate the higher dose of 40 mg daily. Will reassess lipids and if suboptimally controlled with lower dose of crestor, will add zetia 10 mg daily.  Primary hypertension-blood pressure well managed now with addition of HCTZ 25 mg daily, and with amlodipine 10 mg daily, lisinopril 20 mg daily, continue at these doses. Labs today to review renal function  on HCTZ.  Precordial pain-noted to have nonobstructive coronary artery disease which is minimal to mild with mild coronary artery calcifications as well.  Continue on aspirin 81 mg daily and statin as noted above.  Cardiac risk counseling Medication management -As above   Total time of encounter: 30 minutes total time of encounter, including 20 minutes spent in face-to-face patient care on the date of this encounter. This time includes coordination of care and counseling regarding above mentioned problem list. Remainder of non-face-to-face time involved reviewing chart documents/testing relevant to the patient encounter and documentation in the medical record. I have independently reviewed documentation from referring provider.   Cherlynn Kaiser, MD, [place]   Shared Decision Making/Informed Consent:       Medication Adjustments/Labs and Tests Ordered: Current medicines are reviewed at length with the patient today.  Concerns regarding medicines are outlined above.   No orders of the defined types were placed in this encounter.    Meds ordered this encounter  Medications   amLODipine (NORVASC) 5 MG tablet    Sig: Take 1 tablet (5 mg total) by  mouth daily.    Dispense:  90 tablet    Refill:  3     Patient Instructions  Medication Instructions:  DECREASE AMLODIPINE TO '5mg'$  ONCE DAILY  *If you need a refill on your cardiac medications before your next appointment, please call your pharmacy*  Lab Work: None Ordered At This Time.  If you have labs (blood work) drawn today and your tests are completely normal, you will receive your results only by: [place] (if you have MyChart) OR A paper copy in the mail If you have any lab test that is abnormal or we need to change your treatment, we will call you to review the results.  Testing/Procedures: None Ordered At This Time.   Follow-Up: At [facility], you and your health needs are our priority.  As part of our continuing mission to provide you with exceptional heart care, we have created designated Provider Care Teams.  These Care Teams include your primary Cardiologist (physician) and Advanced Practice Providers (APPs -  Physician Assistants and Nurse Practitioners) who all work together to provide you with the care you need, when you need it.  Your next appointment:   6 month(s)  Provider:   Elouise Munroe, MD     How to Take Your Blood Pressure Blood pressure measures how strongly your blood is pressing against the walls of your arteries. Arteries are blood vessels that carry blood from your heart throughout your body. You can take your blood pressure at home with a machine. You may need to check your blood pressure at home: To check if you have high blood pressure (hypertension). To check your blood pressure over time. To make sure your blood pressure medicine is working. Supplies needed: Blood pressure machine, or monitor. A chair to sit in. This should be a chair where you can sit upright with your back supported. Do not sit on a soft couch or an armchair. Table or desk. Small notebook. Pencil or pen. How to prepare Avoid these things for 30  minutes before checking your blood pressure: Having drinks with caffeine in them, such as coffee or tea. Drinking alcohol. Eating. Smoking. Exercising. Do these things five minutes before checking your blood pressure: Go to the bathroom and pee (urinate). Sit in a chair. Be quiet. Do not talk. How to take your blood pressure Follow the  instructions that came with your machine. If you have a digital blood pressure monitor, these may be the instructions: Sit up straight. Place your feet on the floor. Do not cross your ankles or legs. Rest your left arm at the level of your heart. You may rest it on a table, desk, or chair. Pull up your shirt sleeve. Wrap the blood pressure cuff around the upper part of your left arm. The cuff should be 1 inch (2.5 cm) above your elbow. It is best to wrap the cuff around bare skin. Fit the cuff snugly around your arm, but not too tightly. You should be able to place only one finger between the cuff and your arm. Place the cord so that it rests in the bend of your elbow. Press the power button. Sit quietly while the cuff fills with air and loses air. Write down the numbers on the screen. Wait 2-3 minutes and then repeat steps 1-10. What do the numbers mean? Two numbers make up your blood pressure. The first number is called systolic pressure. The second is called diastolic pressure. An example of a blood pressure reading is "120 over 80" (or 120/80). If you are an adult and do not have a medical condition, use this guide to find out if your blood pressure is normal: Normal First number: below 120. Second number: below 80. Elevated First number: 120-129. Second number: below 80. Hypertension stage 1 First number: 130-139. Second number: 80-89. Hypertension stage 2 First number: 140 or above. Second number: 52 or above. Your blood pressure is above normal even if only the first or only the second number is above normal. Follow these instructions at  home: Medicines Take over-the-counter and prescription medicines only as told by your doctor. Tell your doctor if your medicine is causing side effects. General instructions Check your blood pressure as often as your doctor tells you to. Check your blood pressure at the same time every day. Take your monitor to your next doctor's appointment. Your doctor will: Make sure you are using it correctly. Make sure it is working right. Understand what your blood pressure numbers should be. Keep all follow-up visits. General tips You will need a blood pressure machine or monitor. Your doctor can suggest a monitor. You can buy one at a drugstore or online. When choosing one: Choose one with an arm cuff. Choose one that wraps around your upper arm. Only one finger should fit between your arm and the cuff. Do not choose one that measures your blood pressure from your wrist or finger. Where to find more information American Heart Association: www.heart.org Contact a doctor if: Your blood pressure keeps being high. Your blood pressure is suddenly low. Get help right away if: Your first blood pressure number is higher than 180. Your second blood pressure number is higher than 120. These symptoms may be an emergency. Do not wait to see if the symptoms will go away. Get help right away. Call 911. Summary Check your blood pressure at the same time every day. Avoid caffeine, alcohol, smoking, and exercise for 30 minutes before checking your blood pressure. Make sure you understand what your blood pressure numbers should be. This information is not intended to replace advice given to you by your health care provider. Make sure you discuss any questions you have with your health care provider. Document Revised: 02/26/2021 Document Reviewed: 02/26/2021 Elsevier Patient Education  [place].

## 2022-10-26 ENCOUNTER — Other Ambulatory Visit: Payer: Self-pay | Admitting: Nurse Practitioner

## 2022-10-27 ENCOUNTER — Telehealth: Payer: Self-pay | Admitting: Internal Medicine

## 2022-10-27 MED ORDER — AMLODIPINE BESYLATE 5 MG PO TABS: 5.0000 mg | ORAL_TABLET | Freq: Every day | ORAL | 1 refills | Status: DC

## 2022-10-27 NOTE — Telephone Encounter (Signed)
*  STAT* If patient is at the pharmacy, call can be transferred to refill team.   1. Which medications need to be refilled? (please list name of each medication and dose if known)   amLODipine (NORVASC) 5 MG tablet    2. Which pharmacy/location (including street and city if local pharmacy) is medication to be sent to? [id number] - [place], [place] - [address] [facility]    3. Do they need a 30 day or 90 day supply? 90 Day Supply

## 2022-10-27 NOTE — Telephone Encounter (Signed)
Refills has been sent to the pharmacy. 

## 2022-11-03 ENCOUNTER — Other Ambulatory Visit: Payer: Self-pay | Admitting: Internal Medicine

## 2022-11-03 ENCOUNTER — Other Ambulatory Visit: Payer: Self-pay | Admitting: Family Medicine

## 2022-11-03 ENCOUNTER — Ambulatory Visit
Admission: RE | Admit: 2022-11-03 | Discharge: 2022-11-03 | Disposition: A | Discharge status: Home / Self Care | Payer: Medicare Other | Source: Ambulatory Visit | Attending: Family Medicine | Admitting: Family Medicine

## 2022-11-03 DIAGNOSIS — R1032 Left lower quadrant pain: Secondary | ICD-10-CM | POA: Diagnosis not present

## 2022-11-03 DIAGNOSIS — K5790 Diverticulosis of intestine, part unspecified, without perforation or abscess without bleeding: Secondary | ICD-10-CM

## 2022-11-03 DIAGNOSIS — K5900 Constipation, unspecified: Secondary | ICD-10-CM | POA: Diagnosis not present

## 2022-11-03 MED ORDER — IOPAMIDOL (ISOVUE-300) INJECTION 61%
100.0000 mL | Freq: Once | INTRAVENOUS | Status: AC | PRN
  Administered 2022-11-03: 100 mL via INTRAVENOUS

## 2022-12-20 ENCOUNTER — Telehealth: Payer: Self-pay | Admitting: Internal Medicine

## 2022-12-20 NOTE — Telephone Encounter (Signed)
*  STAT* If patient is at the pharmacy, call can be transferred to refill team.   1. Which medications need to be refilled? (please list name of each medication and dose if known)   metoprolol tartrate (LOPRESSOR) 25 MG tablet    2. Which pharmacy/location (including street and city if local pharmacy) is medication to be sent to?  [id number] - [place], [place] - [address] [facility]      3. Do they need a 30 day or 90 day supply? 90 day    Pt is completely out of medication

## 2022-12-21 MED ORDER — METOPROLOL TARTRATE 25 MG PO TABS
ORAL_TABLET | ORAL | 1 refills | Status: DC
Start: 2022-12-21 — End: 2023-11-09

## 2022-12-21 NOTE — Telephone Encounter (Signed)
RX now sent to preferred pharmacy.  

## 2022-12-27 DIAGNOSIS — M25562 Pain in left knee: Secondary | ICD-10-CM | POA: Diagnosis not present

## 2023-01-08 ENCOUNTER — Other Ambulatory Visit: Payer: Self-pay

## 2023-01-08 ENCOUNTER — Emergency Department (HOSPITAL_COMMUNITY)
Admission: EM | Admit: 2023-01-08 | Discharge: 2023-01-08 | Disposition: A | Discharge status: Home / Self Care | Payer: Medicare Other | Attending: Emergency Medicine | Admitting: Emergency Medicine

## 2023-01-08 ENCOUNTER — Other Ambulatory Visit: Payer: Self-pay | Admitting: Internal Medicine

## 2023-01-08 ENCOUNTER — Encounter (HOSPITAL_COMMUNITY): Payer: Self-pay | Admitting: *Deleted

## 2023-01-08 ENCOUNTER — Emergency Department (HOSPITAL_COMMUNITY): Payer: Medicare Other

## 2023-01-08 DIAGNOSIS — R110 Nausea: Secondary | ICD-10-CM | POA: Insufficient documentation

## 2023-01-08 DIAGNOSIS — I10 Essential (primary) hypertension: Secondary | ICD-10-CM | POA: Insufficient documentation

## 2023-01-08 DIAGNOSIS — R0789 Other chest pain: Secondary | ICD-10-CM | POA: Diagnosis not present

## 2023-01-08 DIAGNOSIS — R0602 Shortness of breath: Secondary | ICD-10-CM | POA: Diagnosis not present

## 2023-01-08 DIAGNOSIS — R079 Chest pain, unspecified: Secondary | ICD-10-CM

## 2023-01-08 LAB — CBC
HCT: 46.1 % (ref 39.0–52.0)
Hemoglobin: 15.2 g/dL (ref 13.0–17.0)
MCH: 29.4 pg (ref 26.0–34.0)
MCHC: 33 g/dL (ref 30.0–36.0)
MCV: 89.2 fL (ref 80.0–100.0)
Platelets: 208 10*3/uL (ref 150–400)
RBC: 5.17 MIL/uL (ref 4.22–5.81)
RDW: 13.2 % (ref 11.5–15.5)
WBC: 9.3 10*3/uL (ref 4.0–10.5)
nRBC: 0 % (ref 0.0–0.2)

## 2023-01-08 LAB — BASIC METABOLIC PANEL
Anion gap: 10 (ref 5–15)
BUN: 14 mg/dL (ref 8–23)
CO2: 27 mmol/L (ref 22–32)
Calcium: 9.2 mg/dL (ref 8.9–10.3)
Chloride: 99 mmol/L (ref 98–111)
Creatinine, Ser: 1.15 mg/dL (ref 0.61–1.24)
GFR, Estimated: >60 mL/min (ref >60)
Glucose, Bld: 110 mg/dL — ABNORMAL HIGH (ref 70–99)
Potassium: 3.7 mmol/L (ref 3.5–5.1)
Sodium: 136 mmol/L (ref 135–145)

## 2023-01-08 LAB — TROPONIN I (HIGH SENSITIVITY)
Troponin I (High Sensitivity): 4 ng/L (ref <18)
Troponin I (High Sensitivity): 4 ng/L (ref <18)

## 2023-01-08 MED ORDER — ASPIRIN 81 MG PO CHEW
324.0000 mg | CHEWABLE_TABLET | Freq: Once | ORAL | Status: AC
Start: 2023-01-08 — End: 2023-01-08
  Administered 2023-01-08: 324 mg via ORAL
  Filled 2023-01-08: qty 4

## 2023-01-08 MED ORDER — FAMOTIDINE 20 MG PO TABS
20.0000 mg | ORAL_TABLET | Freq: Once | ORAL | Status: AC
  Administered 2023-01-08: 20 mg via ORAL
  Filled 2023-01-08: qty 1

## 2023-01-08 MED ORDER — ASPIRIN 325 MG PO TABS
325.0000 mg | ORAL_TABLET | Freq: Every day | ORAL | Status: DC
  Filled 2023-01-08: qty 1

## 2023-01-08 MED ORDER — ACETAMINOPHEN 500 MG PO TABS
1000.0000 mg | ORAL_TABLET | Freq: Once | ORAL | Status: DC
  Filled 2023-01-08: qty 2

## 2023-01-08 NOTE — ED Provider Notes (Signed)
[place] EMERGENCY DEPARTMENT AT [facility] Provider Note  MDM   HPI/ROS:  Lawrence Henderson is a 67 y.o. male with a medical history as below who presents for evaluation of chest pain.  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  They resolved.  However when he woke up this morning he was having chest pain.  He describes it as a tightness with occasional sharp pain in the middle of the chest.  The tightness is like a band that goes across his chest.  It radiates into his left arm and down the back of his left arm.  Describes having some diaphoretic events, and feeling short of breath especially when his pain got bad.  He does not have a history of any stent placement no prior MI.  Reports he has a history of a "irregular heartbeat" for which he takes metoprolol.  He denies any fever, chills.  Does endorse some nausea but no vomiting.  No constipation or diarrhea or other infectious type symptoms.  Does not have a cough.  Physical exam is notable for: - Clear and equal bilateral breath sounds - No murmurs rubs or gallops on cardiac exam  On my initial evaluation, patient is:  -Vital signs stable. Patient afebrile, hemodynamically stable, and non-toxic appearing. -Additional history obtained from wife at bedside  This patient's current presentation, including their history and physical exam, is most consistent with ACS. Differentials include less likely aortic pathology or PE.    Will plan for an ACS rule out with delta troponin.  HEART score is minimum of 6, awaiting troponin at this time.  Will give aspirin, he does take a daily aspirin but is only 81 mg.  Chest x-ray is within normal limits.  I reviewed his EKG there is no evidence of active ischemia though he does have some notable first-degree block, most likely related to his use of beta-blocker.  Interpretations, interventions, and the patient's course of care are  documented below.    Clinical Course as of 01/08/23 2253  Sat Jan 08, 2023  1920 Stable 35 YOM with a chief complaint of chest pain.  Left chest, woke up this morning. Pressure. HEART 4+  Echo Normal  [CC]  1921 Coronary CT 2022 demonstrating minimal to moderate CAD. [CC]  1928 Troponin I (High Sensitivity): 4 Initial troponin is 4 [BB]  2233 Troponin I (High Sensitivity): 4 Troponin flat [BB]  2247 Reevaluated bedside.  All symptoms were grossly improved.  Symptoms have been resolved since 3 PM today.  He is a high risk chest pain because of his history of CAD.  Notably he had a coronary CT in 2022 demonstrating minimal to moderate disease we discussed this. Had a shared medical decision making conversation.  Offered observation due to increased heart score versus close follow-up with his established cardiologist given negative tropes and resolution of symptoms.  He elected for outpatient follow-up with strict return precautions understood. [CC]    Clinical Course User Index [BB] Fayrene Helper, MD [CC] Glyn Ade, MD      Disposition:  I discussed the plan for discharge with the patient and/or their surrogate at bedside prior to discharge and they were in agreement with the plan and verbalized understanding of the return precautions provided. All questions answered to the best of my ability. Ultimately, the patient was discharged in stable condition with stable vital signs. I am reassured that they are capable of  close follow up and good social support at home.  I am also reassured that he has an upcoming appointment with his primary care provider who can help ensure he has appropriate follow-up as an outpatient.  He also has a cardiology appointment scheduled upcoming.  Clinical Impression: No diagnosis found.  Rx / DC Orders ED Discharge Orders     None       The plan for this patient was discussed with Dr. Doran Durand, who voiced agreement and who oversaw evaluation and  treatment of this patient.   Clinical Complexity A medically appropriate history, review of systems, and physical exam was performed.  My independent interpretations of EKG, labs, and radiology are documented in the ED course above.   If decision rules were used in this patient's evaluation, they are listed below.    Heart score: 6   Click here for ABCD2, HEART and other calculatorsREFRESH Note before signing   Patient's presentation is most consistent with acute presentation with potential threat to life or bodily function.  Medical Decision Making Amount and/or Complexity of Data Reviewed Independent Historian: spouse External Data Reviewed: notes. Labs: ordered. Decision-making details documented in ED Course. Radiology: ordered. ECG/medicine tests: independent interpretation performed. Decision-making details documented in ED Course.  Risk OTC drugs.    HPI/ROS      See MDM section for pertinent HPI and ROS. A complete ROS was performed with pertinent positives/negatives noted above.   Past Medical History:  Diagnosis Date   Hyperlipidemia    Hypertension     Past Surgical History:  Procedure Laterality Date   APPENDECTOMY     arm surgery     right arm 2006   TONSILLECTOMY        Physical Exam   Vitals:   01/08/23 1830 01/08/23 1831 01/08/23 2145  BP: (!) 150/84  131/86  Pulse: 73  65  Resp: 19  20  Temp: 97.6 F (36.4 C)    TempSrc: Oral    SpO2: 96%  95%  Weight:  111.7 kg   Height:  5\' 11"  (1.803 m)     Physical Exam Vitals and nursing note reviewed.  Constitutional:      General: He is not in acute distress.    Appearance: He is well-developed.  HENT:     Head: Normocephalic and atraumatic.  Eyes:     Conjunctiva/sclera: Conjunctivae normal.     Pupils: Pupils are equal, round, and reactive to light.  Cardiovascular:     Rate and Rhythm: Normal rate and regular rhythm.     Pulses:          Radial pulses are 2+ on the right side and 2+  on the left side.     Heart sounds: Normal heart sounds. No murmur heard. Pulmonary:     Effort: Pulmonary effort is normal. No tachypnea or respiratory distress.     Breath sounds: Normal breath sounds. No decreased breath sounds, wheezing, rhonchi or rales.  Abdominal:     Palpations: Abdomen is soft.     Tenderness: There is no abdominal tenderness.  Musculoskeletal:        General: No swelling.     Cervical back: Neck supple.     Right lower leg: No edema.     Left lower leg: No edema.  Skin:    General: Skin is warm and dry.     Capillary Refill: Capillary refill takes less than 2 seconds.  Neurological:     Mental Status: He  is alert and oriented to person, place, and time.  Psychiatric:        Mood and Affect: Mood normal.      Procedures   If procedures were preformed on this patient, they are listed below:  Procedures   Fayrene Helper, MD Emergency Medicine PGY-2   Please note that this documentation was produced with the assistance of voice-to-text technology and may contain errors.    Fayrene Helper, MD 01/08/23 4098    Glyn Ade, MD 01/08/23 2308

## 2023-01-08 NOTE — Discharge Instructions (Signed)
You are seen today for chest pain.  While you were here, we got an EKG, chest x-ray, check labs.  All of these were reassuring that you are not having any active heart attack developing at this time.  As discussed, no test is perfect and that is why it so important to follow-up with your primary care doctor within the next week to arrange any outpatient follow-up that you might need including an echo (an ultrasound of your heart).   Return to the emergency department if you have any new symptoms including return of your chest pain so we have the opportunity to reevaluate you.  I look today was only a snapshot in time, and your health can change from moment to moment.  Do not hesitate to return if you have any new symptoms.  Keep your upcoming appointment with your primary care doctor this week, and call your cardiologist to see if you are able to get in with them sooner.

## 2023-01-08 NOTE — ED Triage Notes (Signed)
The pt has had chest pain since this am with an irregular heart rate

## 2023-01-08 NOTE — ED Notes (Signed)
Patient ambulated to the restroom with the assistance of the NT. Patient reported no discomfort during ambulation or upon return to the room.

## 2023-01-10 ENCOUNTER — Encounter: Payer: Self-pay | Admitting: Physician Assistant

## 2023-01-10 ENCOUNTER — Ambulatory Visit: Payer: Medicare Other | Attending: Physician Assistant | Admitting: Physician Assistant

## 2023-01-10 ENCOUNTER — Telehealth: Payer: Self-pay | Admitting: Internal Medicine

## 2023-01-10 VITALS — BP 122/70 | HR 73 | Ht 71.0 in | Wt 242.2 lb

## 2023-01-10 DIAGNOSIS — I25119 Atherosclerotic heart disease of native coronary artery with unspecified angina pectoris: Secondary | ICD-10-CM

## 2023-01-10 DIAGNOSIS — Z01818 Encounter for other preprocedural examination: Secondary | ICD-10-CM

## 2023-01-10 DIAGNOSIS — R079 Chest pain, unspecified: Secondary | ICD-10-CM

## 2023-01-10 DIAGNOSIS — R0789 Other chest pain: Secondary | ICD-10-CM

## 2023-01-10 NOTE — Telephone Encounter (Signed)
Spoke to the patient, he was seen in [facility] on 7/13 for chest pain and hypertension. No medication changes and patient was advised to contact cardiology. Pt is scheduled with APP on 7/15 @1420 , explained ED precautions and pt voiced understanding.

## 2023-01-10 NOTE — Progress Notes (Unsigned)
Cardiology Office Note:  .   Date:  01/11/2023  ID:  Lawrence Henderson, DOB [birth year], MRN [id number] PCP: Lupita Raider, MD  [facility] Providers Cardiologist:  Parke Poisson, MD     History of Present Illness: .   Lawrence Henderson is a 67 y.o. male with history of chest pain with low risk myoview in 2011 and 2018, chronic dyspnea since COVID PNA 12/2019, HTN, HLD and obesity.  Previous coronary CTA obtained on 01/14/2021 demonstrated 1 to 24% OM1 lesion, 1 to 24% proximal D1 lesion, 25 to 49% proximal LAD lesion, coronary calcium score 30 which placed the patient at 40th percentile for age and sex matched control.  Last echocardiogram obtained on 04/21/2022 showed EF 65%, no significant valve issue.  Patient was last seen by Dr. Jacques Navy on 08/09/2022 at which time he was doing well.  Patient was recently seen in the ED on 01/08/2023 with chest discomfort.  Patient presents today for post ED follow-up.  Serial troponin was negative x 2 in the emergency room.  Chest x-ray was normal.  EKG showed no acute changes other than T wave inversion in the inferior lead.  Talking with the patient, he had brief 15-minute of chest pain the day prior to ED visit.  Again the morning of the ED visit, he had 4 hours of chest discomfort.  He had additional chest pain in the afternoon for 2 hours which prompted the patient to seek urgent medical attention in the emergency room.  He had a brief chest discomfort this morning, however it did not last very long.  His chest discomfort does not occur with physical activity.  I recommended a coronary CT to further assess.  In the morning of the coronary CT, he will hold lisinopril, hydrochlorothiazide and increase his home metoprolol tartrate to 50 mg 2 hours prior to the coronary CT.  He can follow-up with Dr. Jacques Navy in August as previously scheduled.  ROS:   Patient has been having intermittent chest discomfort.  Studies Reviewed: .        Cardiac Studies &  Procedures     STRESS TESTS  NM MYOCAR MULTI W/SPECT W 01/29/2017  Narrative  Blood pressure demonstrated a hypertensive response to exercise.  There was no ST segment deviation noted during stress.  The study is normal.  This is a low risk study.  Nuclear stress EF: 56%.  Low risk stress nuclear study with normal perfusion and normal left ventricular regional and global systolic function.   ECHOCARDIOGRAM  ECHOCARDIOGRAM COMPLETE 04/21/2022  Narrative ECHOCARDIOGRAM REPORT    Patient Name:   Lawrence Henderson Date of Exam: 04/21/2022 Medical Rec #:  [id number]        Height:       71.0 in Accession #:    [id number]       Weight:       251.0 lb Date of Birth:  02-May-1956        BSA:          2.322 m Patient Age:    66 years         BP:           126/80 mmHg Patient Gender: M                HR:           78 bpm. Exam Location:  [address]  Procedure: 2D Echo, Cardiac Doppler, Color Doppler and Strain Analysis  Indications:  R06.09 DOE  History:        Patient has prior history of Echocardiogram examinations, most recent 09/15/2020. Signs/Symptoms:Chest Pain; Risk Factors:Hypertension and Dyslipidemia. Prediabetes. Obesity.  Sonographer:    Cathie Beams RCS Referring Phys: [id number] JESSE M CLEAVER  IMPRESSIONS   1. Left ventricular ejection fraction, by estimation, is 65%. The left ventricle has normal function. The left ventricle has no regional wall motion abnormalities. There is mild asymmetric left ventricular hypertrophy of the basal-septal segment. Left ventricular diastolic parameters were normal. The average left ventricular global longitudinal strain is -27.0 %. The global longitudinal strain is normal. 2. Right ventricular systolic function is normal. The right ventricular size is normal. Tricuspid regurgitation signal is inadequate for assessing PA pressure. 3. The mitral valve is grossly normal. No evidence of mitral valve regurgitation. No evidence of  mitral stenosis. 4. The aortic valve is tricuspid. Aortic valve regurgitation is not visualized. No aortic stenosis is present.  Comparison(s): No significant change from prior study. Prior images reviewed side by side.  FINDINGS Left Ventricle: Left ventricular ejection fraction, by estimation, is 65%. The left ventricle has normal function. The left ventricle has no regional wall motion abnormalities. The average left ventricular global longitudinal strain is -27.0 %. The global longitudinal strain is normal. The left ventricular internal cavity size was normal in size. There is mild asymmetric left ventricular hypertrophy of the basal-septal segment. Left ventricular diastolic parameters were normal.  Right Ventricle: The right ventricular size is normal. No increase in right ventricular wall thickness. Right ventricular systolic function is normal. Tricuspid regurgitation signal is inadequate for assessing PA pressure.  Left Atrium: Left atrial size was normal in size.  Right Atrium: Right atrial size was normal in size.  Pericardium: Trivial pericardial effusion is present. Presence of epicardial fat layer.  Mitral Valve: The mitral valve is grossly normal. No evidence of mitral valve regurgitation. No evidence of mitral valve stenosis.  Tricuspid Valve: The tricuspid valve is normal in structure. Tricuspid valve regurgitation is not demonstrated. No evidence of tricuspid stenosis.  Aortic Valve: The aortic valve is tricuspid. There is mild aortic valve annular calcification. Aortic valve regurgitation is not visualized. No aortic stenosis is present.  Pulmonic Valve: The pulmonic valve was not well visualized. Pulmonic valve regurgitation is trivial. No evidence of pulmonic stenosis.  Aorta: The aortic root is normal in size and structure.  IAS/Shunts: The interatrial septum was not well visualized.   LEFT VENTRICLE PLAX 2D LVIDd:         4.30 cm   Diastology LVIDs:          2.50 cm   LV e' medial:    9.14 cm/s LV PW:         1.00 cm   LV E/e' medial:  7.3 LV IVS:        1.00 cm   LV e' lateral:   11.40 cm/s LVOT diam:     2.40 cm   LV E/e' lateral: 5.9 LV SV:         96 LV SV Index:   41        2D Longitudinal Strain LVOT Area:     4.52 cm  2D Strain GLS Avg:     -27.0 %   RIGHT VENTRICLE RV Basal diam:  3.30 cm RV S prime:     17.80 cm/s TAPSE (M-mode): 2.2 cm  LEFT ATRIUM             Index  RIGHT ATRIUM           Index LA diam:        3.90 cm 1.68 cm/m   RA Area:     14.10 cm LA Vol (A2C):   51.0 ml 21.96 ml/m  RA Volume:   32.70 ml  14.08 ml/m LA Vol (A4C):   49.2 ml 21.19 ml/m LA Biplane Vol: 50.0 ml 21.53 ml/m AORTIC VALVE LVOT Vmax:   112.00 cm/s LVOT Vmean:  71.300 cm/s LVOT VTI:    0.212 m  AORTA Ao Root diam: 3.80 cm  MITRAL VALVE MV Area (PHT): 3.77 cm    SHUNTS MV Decel Time: 201 msec    Systemic VTI:  0.21 m MV E velocity: 67.10 cm/s  Systemic Diam: 2.40 cm MV A velocity: 77.60 cm/s MV E/A ratio:  0.86  Riley Lam MD Electronically signed by Riley Lam MD Signature Date/Time: 04/21/2022/11:09:49 AM    Final    MONITORS  LONG TERM MONITOR (3-14 DAYS) 05/06/2022  Narrative Impressions:Brief episodes of supraventricular tachycardia. Patient symptoms correlate with infrequent ventricular ectopy. ---------------------------------------------------------------------------------------------- Patch Wear Time:  14 days and 0 hours (2023-10-17T20:51:08-398 to 2023-10-31T20:51:12-398)  Patient had a min HR of 62 bpm, max HR of 218 bpm, and avg HR of 84 bpm. Predominant underlying rhythm was Sinus Rhythm. 3 Supraventricular Tachycardia runs occurred, the run with the fastest interval lasting 11 beats with a max rate of 218 bpm, the longest lasting 12 beats with an avg rate of 111 bpm. Isolated SVEs were rare (<1.0%), SVE Couplets were rare (<1.0%), and SVE Triplets were rare (<1.0%). Isolated VEs  were rare (<1.0%), VE Couplets were rare (<1.0%), and no VE Triplets were present. Ventricular Bigeminy was present.   CT SCANS  CT CORONARY MORPH W/CTA COR W/SCORE 01/14/2021  Addendum 01/14/2021  1:07 PM ADDENDUM REPORT: 01/14/2021 13:04  EXAM: OVER-READ INTERPRETATION  CT CHEST  The following report is an over-read performed by radiologist Dr. Maryelizabeth Rowan [facility] on 01/14/2021. This over-read does not include interpretation of cardiac or coronary anatomy or pathology. The coronary CTA interpretation by the cardiologist is attached.  COMPARISON:  Coronary CTA  FINDINGS: Limited view of the lung parenchyma demonstrates no suspicious nodularity. Airways are normal.  Limited view of the mediastinum demonstrates no adenopathy. Esophagus normal.  Limited view of the upper abdomen unremarkable.  Limited view of the skeleton and chest wall is unremarkable.  IMPRESSION: No significant extracardiac   Electronically Signed By: Genevive Bi M.D. On: 01/14/2021 13:04  Narrative HISTORY: 67 yo male with chest pain/anginal equiv, ECGs and troponins normal  EXAM: Cardiac/Coronary CTA  TECHNIQUE: The patient was scanned on a [employer].  PROTOCOL: A 120 kV prospective scan was triggered in the descending thoracic aorta at 111 HU's. Axial non-contrast 3 mm slices were carried out through the heart. The data set was analyzed on a dedicated work station and scored using the Agatson method. Gantry rotation speed was 250 msecs and collimation was .6 mm. Beta blockade and 0.8 mg of sl NTG was given. The 3D data set was reconstructed in 5% intervals of the 35-75 % of the R-R cycle. Diastolic phases were analyzed on a dedicated work station using MPR, MIP and VRT modes. The patient received of contrast.  FINDINGS: Quality: Very good, HR 62, attenuation artifact  Coronary calcium score: The patient's coronary artery calcium score is 30,  which places the patient in the 40th percentile.  Coronary arteries: Normal coronary origins.  Right  dominance.  Right Coronary Artery: Dominant. Minimal 1-24% mixed distal vessel stenosis (CADRADS1). Normal R-PDA and R-PLB branches.  Left Main Coronary Artery: Normal. Bifurcates into the LAD and LCx arteries.  Left Anterior Descending Coronary Artery: Anterior vessel which reaches the apex. There is a mild 25-49% eccentric non-calcified stenosis of the proximal vessel, just prior to a small D1 takeoff (CADRADS2). The D1 branch demonstrates minimal 1-24% proximal mixed stenoses (CADRADS1).  Left Circumflex Artery: Smaller AV groove vessel without disease. Larger high OM1 branch with minimal non-calcified proximal stenosis (1-24%, CADRADS1).  Aorta: Normal size, 31 mm at the mid ascending aorta (level of the PA bifurcation) measured double oblique. No calcifications. No dissection.  Aortic Valve: Trileaflet.  No calcifications.  Other findings:  Normal pulmonary vein drainage into the left atrium.  Normal left atrial appendage without a thrombus.  Normal size of the pulmonary artery.  IMPRESSION: 1. 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  2. Coronary calcium score of 30. This was 40th percentile for age and sex matched control.  3. Normal coronary origin with right dominance.  4. Aggressive cardiovascular risk factor modification is recommended.  Electronically Signed: By: Chrystie Nose M.D. On: 01/14/2021 11:12          Risk Assessment/Calculations:             Physical Exam:   VS:  BP 122/70   Pulse 73   Ht 5\' 11"  (1.803 m)   Wt 242 lb 3.2 oz (109.9 kg)   SpO2 97%   BMI 33.78 kg/m    Wt Readings from Last 3 Encounters:  01/10/23 242 lb 3.2 oz (109.9 kg)  01/08/23 246 lb 4.1 oz (111.7 kg)  08/09/22 246 lb 3.2 oz (111.7 kg)    GEN: Well nourished, well developed in no acute distress NECK: No JVD; No carotid bruits CARDIAC: RRR,  no murmurs, rubs, gallops RESPIRATORY:  Clear to auscultation without rales, wheezing or rhonchi  ABDOMEN: Soft, non-tender, non-distended EXTREMITIES:  No edema; No deformity   ASSESSMENT AND PLAN: .    Chest discomfort: Previous coronary CT in July 2022 showed minimal disease.  He has been having intermittent chest discomfort recently.  Last episode was this morning.  Will proceed with coronary CT to further assess.  CAD: Mild CAD seen on previous coronary CT.  See #1.       Dispo: Follow-up with Dr. Jacques Navy in August as previously scheduled.  Signed, Azalee Course, PA

## 2023-01-10 NOTE — Patient Instructions (Addendum)
Medication Instructions:  The morning of he Cardio CT,  you are to HOLD the Hydrochlorothiazide and Lisinopril. TAKE 50MG  of the Metoprolol (2 tablets) the morning of the CT procedure. You are to take this medication 2 hours before the procedure *If you need a refill on your cardiac medications before your next appointment, please call your pharmacy*   Lab Work: none If you have labs (blood work) drawn today and your tests are completely normal, you will receive your results only by: MyChart Message (if you have MyChart) OR A paper copy in the mail If you have any lab test that is abnormal or we need to change your treatment, we will call you to review the results.   Testing/Procedures:   Your cardiac CT will be scheduled at one of the below locations:   [facility] [address] [place], [place] [address] [phone]  OR  [facility] [address] [place], [place] [address] [phone]  OR   [facility] [address] [place], [place] [address] [phone]  If scheduled at [facility], please arrive at the [facility] Entrance (Entrance C2) of [facility] 30 minutes prior to test start time. You can use the FREE valet parking offered at entrance C (encouraged to control the heart rate for the test)  Proceed to the [facility] Radiology Department (first floor) to check-in and test prep.  All radiology patients and guests should use entrance C2 at [facility], accessed from [facility], even though the hospital's physical address listed is [address]    If scheduled at [facility] or [facility], please arrive 15 mins early for check-in and test prep. There is spacious parking and easy access to the radiology department from the [facility] entrance. Please enter  here and check-in with the desk attendant.    Please follow these instructions carefully (unless otherwise directed):  An IV will be required for this test and Nitroglycerin will be given.  Hold all erectile dysfunction medications at least 3 days (72 hrs) prior to test. (Ie viagra, cialis, sildenafil, tadalafil, etc)   On the Night Before the Test: Be sure to Drink plenty of water. Do not consume any caffeinated/decaffeinated beverages or chocolate 12 hours prior to your test. Do not take any antihistamines 12 hours prior to your test. If the patient has contrast allergy: Patient will need a prescription for Prednisone and very clear instructions (as follows): Prednisone 50 mg - take 13 hours prior to test Take another Prednisone 50 mg 7 hours prior to test Take another Prednisone 50 mg 1 hour prior to test   On the Day of the Test: Drink plenty of water until 1 hour prior to the test. Do not eat any food 1 hour prior to test. You may take your regular medications prior to the test.  Take metoprolol (Lopressor) two hours prior to test. If you take Furosemide/Hydrochlorothiazide/Spironolactone, please HOLD on the morning of the test. FEMALES- please wear underwire-free bra if available, avoid dresses & tight clothing       After the Test: Drink plenty of water. After receiving IV contrast, you may experience a mild flushed feeling. This is normal. On occasion, you may experience a mild rash up to 24 hours after the test. This is not dangerous. If this occurs, you can take Benadryl 25 mg and increase your  fluid intake. If you experience trouble breathing, this can be serious. If it is severe call 911 IMMEDIATELY. If it is mild, please call our office. If you take any of these medications: Glipizide/Metformin, Avandament, Glucavance, please do not take 48 hours after completing test unless otherwise instructed.  We will call to schedule your test 2-4 weeks out understanding that some  insurance companies will need an authorization prior to the service being performed.   For more information and frequently asked questions, please visit our website : [email or web address]  For non-scheduling related questions, please contact the cardiac imaging nurse navigator should you have any questions/concerns: Rockwell Alexandria, Cardiac Imaging Nurse Navigator Larey Brick, Cardiac Imaging Nurse Navigator [facility] Direct Office Dial: [phone]   For scheduling needs, including cancellations and rescheduling, please call [place], [phone].    Follow-Up: At [facility], you and your health needs are our priority.  As part of our continuing mission to provide you with exceptional heart care, we have created designated Provider Care Teams.  These Care Teams include your primary Cardiologist (physician) and Advanced Practice Providers (APPs -  Physician Assistants and Nurse Practitioners) who all work together to provide you with the care you need, when you need it.  We recommend signing up for the patient portal called "MyChart".  Sign up information is provided on this After Visit Summary.  MyChart is used to connect with patients for Virtual Visits (Telemedicine).  Patients are able to view lab/test results, encounter notes, upcoming appointments, etc.  Non-urgent messages can be sent to your provider as well.   To learn more about what you can do with MyChart, go to [email or web address].    Your next appointment:   1 month 02/17/2023 at 8 am  Provider:   Parke Poisson, MD

## 2023-01-10 NOTE — Telephone Encounter (Signed)
Pt states he was in the ED and they suggested he reach out to Cardiology to have a stress test ordered. Please advise.

## 2023-01-11 DIAGNOSIS — K5790 Diverticulosis of intestine, part unspecified, without perforation or abscess without bleeding: Secondary | ICD-10-CM | POA: Diagnosis not present

## 2023-01-11 DIAGNOSIS — I10 Essential (primary) hypertension: Secondary | ICD-10-CM | POA: Diagnosis not present

## 2023-01-11 DIAGNOSIS — I2510 Atherosclerotic heart disease of native coronary artery without angina pectoris: Secondary | ICD-10-CM | POA: Diagnosis not present

## 2023-01-11 DIAGNOSIS — U099 Post covid-19 condition, unspecified: Secondary | ICD-10-CM | POA: Diagnosis not present

## 2023-01-11 DIAGNOSIS — R7303 Prediabetes: Secondary | ICD-10-CM | POA: Diagnosis not present

## 2023-01-11 DIAGNOSIS — E782 Mixed hyperlipidemia: Secondary | ICD-10-CM | POA: Diagnosis not present

## 2023-01-11 DIAGNOSIS — Z9181 History of falling: Secondary | ICD-10-CM | POA: Diagnosis not present

## 2023-01-11 DIAGNOSIS — Z0000 Encounter for general adult medical examination without abnormal findings: Secondary | ICD-10-CM | POA: Diagnosis not present

## 2023-01-11 LAB — LAB REPORT - SCANNED
A1c: 5.8
EGFR: 96
PSA, Total: 1.07

## 2023-01-12 ENCOUNTER — Telehealth (HOSPITAL_COMMUNITY): Payer: Self-pay | Admitting: *Deleted

## 2023-01-12 NOTE — Telephone Encounter (Signed)
Calling patient to schedule his cardiac CT. New appointment made for Friday, July 19 at 8:30 AM. He verbalizes possession of instructions from the office and denies allergy to IV contrast.  Patient to hold his hydrochlorothiazide and lisinopril and take 50mg  metoprolol tartrate two hours prior to his cardiac CT scan.  He will call back if he has any questions.  Larey Brick RN Navigator Cardiac Imaging [facility] [phone] Office [phone] Cell

## 2023-01-14 ENCOUNTER — Ambulatory Visit (HOSPITAL_COMMUNITY)
Admission: RE | Admit: 2023-01-14 | Discharge: 2023-01-14 | Disposition: A | Discharge status: Home / Self Care | Payer: Medicare Other | Source: Ambulatory Visit | Attending: Physician Assistant | Admitting: Physician Assistant

## 2023-01-14 ENCOUNTER — Telehealth: Payer: Self-pay | Admitting: Internal Medicine

## 2023-01-14 DIAGNOSIS — I2510 Atherosclerotic heart disease of native coronary artery without angina pectoris: Secondary | ICD-10-CM | POA: Diagnosis not present

## 2023-01-14 DIAGNOSIS — R0789 Other chest pain: Secondary | ICD-10-CM | POA: Diagnosis not present

## 2023-01-14 MED ORDER — HYDROCHLOROTHIAZIDE 25 MG PO TABS: 25.0000 mg | ORAL_TABLET | Freq: Every day | ORAL | 3 refills | Status: DC

## 2023-01-14 MED ORDER — NITROGLYCERIN 0.4 MG SL SUBL
0.8000 mg | SUBLINGUAL_TABLET | Freq: Once | SUBLINGUAL | Status: AC
  Administered 2023-01-14: 0.8 mg via SUBLINGUAL

## 2023-01-14 MED ORDER — IOHEXOL 350 MG/ML SOLN
95.0000 mL | Freq: Once | INTRAVENOUS | Status: AC | PRN
  Administered 2023-01-14: 95 mL via INTRAVENOUS

## 2023-01-14 NOTE — Telephone Encounter (Signed)
Pt's medication was sent to pt's pharmacy as requested. Confirmation received.  °

## 2023-01-14 NOTE — Progress Notes (Signed)
Mild coronary artery disease noted, no significant blockage to explain the recent chest pain. Noncardiac portion read pending by radiologist.

## 2023-01-14 NOTE — Telephone Encounter (Signed)
*  STAT* If patient is at the pharmacy, call can be transferred to refill team.   1. Which medications need to be refilled? (please list name of each medication and dose if known) hydrochlorothiazide (HYDRODIURIL) 25 MG tablet   2. Which pharmacy/location (including street and city if local pharmacy) is medication to be sent to? [id number] - [place], [place] - [address] [facility]  3. Do they need a 30 day or 90 day supply?  90 day supply

## 2023-01-21 ENCOUNTER — Other Ambulatory Visit: Payer: Self-pay | Admitting: Nurse Practitioner

## 2023-02-17 ENCOUNTER — Encounter: Payer: Self-pay | Admitting: Internal Medicine

## 2023-02-17 ENCOUNTER — Ambulatory Visit: Payer: Medicare Other | Attending: Internal Medicine | Admitting: Internal Medicine

## 2023-02-17 VITALS — BP 132/82 | HR 68 | Ht 71.0 in | Wt 250.0 lb

## 2023-02-17 DIAGNOSIS — E785 Hyperlipidemia, unspecified: Secondary | ICD-10-CM | POA: Diagnosis not present

## 2023-02-17 DIAGNOSIS — R0789 Other chest pain: Secondary | ICD-10-CM | POA: Diagnosis not present

## 2023-02-17 DIAGNOSIS — R002 Palpitations: Secondary | ICD-10-CM

## 2023-02-17 DIAGNOSIS — I25119 Atherosclerotic heart disease of native coronary artery with unspecified angina pectoris: Secondary | ICD-10-CM | POA: Diagnosis not present

## 2023-02-17 DIAGNOSIS — R0609 Other forms of dyspnea: Secondary | ICD-10-CM | POA: Diagnosis not present

## 2023-02-17 DIAGNOSIS — Z79899 Other long term (current) drug therapy: Secondary | ICD-10-CM | POA: Diagnosis not present

## 2023-02-17 DIAGNOSIS — I10 Essential (primary) hypertension: Secondary | ICD-10-CM

## 2023-02-17 MED ORDER — NITROGLYCERIN 0.4 MG SL SUBL: 0.4000 mg | SUBLINGUAL_TABLET | SUBLINGUAL | 3 refills | Status: AC | PRN

## 2023-02-17 NOTE — Progress Notes (Signed)
Cardiology Office Note:    Date:  02/17/2023   ID:  Lawrence Henderson, DOB 12-06-1955, MRN [id number]  PCP:  Lupita Raider, MD  Cardiologist:  Parke Poisson, MD  Electrophysiologist:  None   Referring MD: Lupita Raider, MD   Chief Complaint/Reason for Referral: Follow-up HTN, HLD  History of Present Illness:    Lawrence Henderson is a 67 y.o. male with a history of chest pain with low risk Myoview in 2011 and 2018, chronic dyspnea since COVID pneumonia in 12/2019, hypertension, hyperlipidemia, and obesity.  Most recently seen by [facility] NP 06/01/22.  02/17/2023: Patient had an episode of chest pain that required an emergency department visit where he ruled out for MI, subsequently seen by Azalee Course in the office.  Coronary CT performed with nonobstructive CAD similar to prior.  No incidental findings on chest portion of CT.  Patient is able to distinguish his chest discomfort from his indigestion/GERD which he treats with Prevacid and Tums.  He has not yet tried nitroglycerin.  He did note that when he took 4 baby aspirin in the ER his chest pain improved.  We discussed a strategy of determining the etiology of his chest pain knowing that it is not from obstructive CAD.  Episodes occur approximately every 6 to 8 months but not reliably so.  We will write for a prescription for nitroglycerin and I have instructed him in detail on how to use this medication and when to present to the ER.  In addition he is concerned that perhaps this could be related to arrhythmia.  Recall that a monitor was performed at the end of 2023 demonstrating brief episodes of SVT and no atrial fibrillation.  He does take metoprolol tartrate only half a tab in the morning and skips the p.m. dose.  This is in agreement with prior visits where he has had some hypotension at night leading to probable poor perfusion of his retina, I am okay if he does not take the evening dose of metoprolol.  However during an episode of  chest discomfort, if he is concerned that his heart rate is elevated, he may be able to utilize metoprolol half a tab every 30 minutes for 2 doses if he feels this is the primary culprit in his symptoms.  We have talked through these regimens in detail and he and his wife have a good understanding of how to proceed.  I suspect his chest discomfort may be coming from microvascular dysfunction and/or blood pressure issues.  Blood pressure during these episodes is approximately 150/90, not significantly elevated but may contribute to issues with microvascular dysfunction.  Prior visits:  Reported palpitations at prior visits, low risk findings of infrequent SVT and PVCs. Overall doing well.  Has been following with the eye doctor, some concerns about hypotension at night with poor perfusion to the retina.  Request to make changes to antihypertensive therapy.  Blood pressure overall well-controlled and the patient had not taken his amlodipine yet this am. Discussed in great detail management of BP.   During his hospitalization for chest pain we performed a coronary CTA demonstrating minimal to mild nonobstructive CAD, coronary calcium score of 30 which is 40th percentile for age and sex matched controls, normal coronary origins, and no significant incidental extracardiac findings, particularly in light of his history of COVID-19 and chronic dyspnea. He has a history of hypertension and takes amlodipine, lisinopril. His primary care doctor uptitrated his amlodipine to 10 mg daily and he was  tolerating this well without lower extremity edema or signs of hypotension. We reviewed dietary modifications that can be made to reduce triglycerides, and also reviewed medication changes that can be performed. His LDL was at goal at 63 and he has a HDL of 51.  He did not feel he will be able to make the significant dietary changes needed to aggressively lower his triglycerides.   Past Medical History:  Diagnosis Date    Hyperlipidemia    Hypertension     Past Surgical History:  Procedure Laterality Date   APPENDECTOMY     arm surgery     right arm 2006   TONSILLECTOMY      Current Medications: Current Meds  Medication Sig   amLODipine (NORVASC) 5 MG tablet Take 1 tablet (5 mg total) by mouth daily.   aspirin EC 81 MG tablet Take 81 mg by mouth daily.   co-enzyme Q-10 30 MG capsule Take 30 mg by mouth daily.    cyanocobalamin (VITAMIN B12) 1000 MCG tablet Take 1,000 mcg by mouth daily.   ezetimibe (ZETIA) 10 MG tablet TAKE 1 TABLET BY MOUTH EVERY DAY   famotidine (PEPCID) 20 MG tablet Take 20 mg by mouth daily.   hydrochlorothiazide (HYDRODIURIL) 25 MG tablet Take 1 tablet (25 mg total) by mouth daily.   lisinopril (PRINIVIL,ZESTRIL) 20 MG tablet Take 20 mg by mouth daily.   metoprolol tartrate (LOPRESSOR) 25 MG tablet Take 0.5 tablets (12.5 mg total) by mouth every morning AND 1 tablet (25 mg total) daily at 2 PM.   Multiple Vitamin (MULTIVITAMIN WITH MINERALS) TABS tablet Take 1 tablet by mouth daily.   nitroGLYCERIN (NITROSTAT) 0.4 MG SL tablet Place 1 tablet (0.4 mg total) under the tongue every 5 (five) minutes as needed for chest pain.   rosuvastatin (CRESTOR) 20 MG tablet Take 1 tablet (20 mg total) by mouth daily.   vitamin C (ASCORBIC ACID) 500 MG tablet Take 500 mg by mouth daily.   zinc gluconate 50 MG tablet Take 50 mg by mouth daily.     Allergies:   Sulfa antibiotics   Social History   Tobacco Use   Smoking status: Former    Current packs/day: 0.00    Types: Cigarettes    Quit date: 1975    Years since quitting: 49.6   Smokeless tobacco: Never   Tobacco comments:    smoked for about a month or two  Vaping Use   Vaping status: Never Used  Substance Use Topics   Alcohol use: Yes   Drug use: No    Comment: Quit when he was 31     Family History: The patient's family history includes Atrial fibrillation in his mother; Diabetes in his paternal grandfather; Diabetes  Mellitus II in his paternal grandfather; Heart attack in his father; Lung cancer in his mother.  ROS:   Please see the history of present illness.  (+) Bilateral LE edema   All other systems reviewed and are negative.  EKGs/Labs/Other Studies Reviewed:    The following studies were reviewed today: Echo 09/15/2020 1. Left ventricular ejection fraction, by estimation, is 60 to 65%. The  left ventricle has normal function. The left ventricle has no regional  wall motion abnormalities. There is mild asymmetric left ventricular  hypertrophy of the basal and septal  segments. Left ventricular diastolic parameters were normal. The average  left ventricular global longitudinal strain is -22.0 %. The global  longitudinal strain is normal.   2. Right ventricular systolic function  is normal. The right ventricular  size is normal.   3. Left atrial size was mildly dilated.   4. The mitral valve is normal in structure. Trivial mitral valve  regurgitation. No evidence of mitral stenosis.   5. The aortic valve was not well visualized. Aortic valve regurgitation  is not visualized. No aortic stenosis is present.   6. The inferior vena cava is normal in size with greater than 50%  respiratory variability, suggesting right atrial pressure of 3 mmHg.   EKG:  none today. 04/20/2021: NSR, 84 bpm, Non-specific T-wave abnormality 02/17/2021: NSR  I have independently reviewed the images from coronary CTA 01/14/21.  Recent Labs: 04/09/2022: Magnesium 2.2 01/08/2023: BUN 14; Creatinine, Ser 1.15; Hemoglobin 15.2; Platelets 208; Potassium 3.7; Sodium 136  Recent Lipid Panel    Component Value Date/Time   CHOL 136 11/03/2021 0926   TRIG 112 11/03/2021 0926   HDL 53 11/03/2021 0926   CHOLHDL 2.6 11/03/2021 0926   CHOLHDL 2.9 01/14/2021 0203   VLDL 35 01/14/2021 0203   LDLCALC 63 11/03/2021 0926    Physical Exam:    VS:  BP 132/82 (BP Location: Left Arm, Patient Position: Sitting, Cuff Size: Normal)    Pulse 68   Ht 5\' 11"  (1.803 m)   Wt 250 lb (113.4 kg)   SpO2 95%   BMI 34.87 kg/m     Wt Readings from Last 5 Encounters:  02/17/23 250 lb (113.4 kg)  01/10/23 242 lb 3.2 oz (109.9 kg)  01/08/23 246 lb 4.1 oz (111.7 kg)  08/09/22 246 lb 3.2 oz (111.7 kg)  06/01/22 248 lb 12.8 oz (112.9 kg)    Constitutional: No acute distress Eyes: sclera non-icteric, normal conjunctiva and lids ENMT: normal dentition, moist mucous membranes Cardiovascular: regular rhythm, normal rate, no murmurs. S1 and S2 normal. Radial pulses normal bilaterally. No jugular venous distention.  Respiratory: clear to auscultation bilaterally GI : normal bowel sounds, soft and nontender. No distention.   MSK: extremities warm, well perfused. No edema.  NEURO: grossly nonfocal exam, moves all extremities. PSYCH: alert and oriented x 3, normal mood and affect.   ASSESSMENT:    1. Chest discomfort   2. Coronary artery disease involving native coronary artery of native heart with angina pectoris (HCC)   3. Hyperlipidemia, unspecified hyperlipidemia type   4. Essential hypertension   5. Palpitations   6. DOE (dyspnea on exertion)   7. Medication management       PLAN:    HLD - lipids well managed on zetia 10 mg daily and crestor 20 mg daily. Did not tolerate higher dose of crestor.   Chest pain Primary hypertension Medication management - blood pressure well managed HCTZ 25 mg daily, amlodipine 5 mg daily, lisinopril 20 mg, metoprolol 12.5 mg a.m.  Blood pressure with reasonable control and mildly elevated during episodes of chest pain.  This may contribute to his episodic chest discomfort.  I have instructed him on how to take sublingual nitroglycerin for episodes of chest pain.  In addition he may be able to utilize metoprolol if he feels this is arrhythmia related chest tightness.  Coronary CTA again demonstrates nonobstructive CAD.  If he requires further coronary assessments, would prefer PET/CT with  myocardial blood flow evaluation to evaluate for microvascular dysfunction.   Total time of encounter: 30 minutes total time of encounter, including 20 minutes spent in face-to-face patient care on the date of this encounter. This time includes coordination of care and counseling regarding above mentioned  problem list. Remainder of non-face-to-face time involved reviewing chart documents/testing relevant to the patient encounter and documentation in the medical record. I have independently reviewed documentation from referring provider.   Weston Brass, MD, [facility] [place]  [facility]   Shared Decision Making/Informed Consent:       Medication Adjustments/Labs and Tests Ordered: Current medicines are reviewed at length with the patient today.  Concerns regarding medicines are outlined above.   No orders of the defined types were placed in this encounter.    Meds ordered this encounter  Medications   nitroGLYCERIN (NITROSTAT) 0.4 MG SL tablet    Sig: Place 1 tablet (0.4 mg total) under the tongue every 5 (five) minutes as needed for chest pain.    Dispense:  90 tablet    Refill:  3     Patient Instructions  Medication Instructions:  START  nitrogylcerin 0.4 mg as needed  *If you need a refill on your cardiac medications before your next appointment, please call your pharmacy*    Follow-Up: At [facility], you and your health needs are our priority.  As part of our continuing mission to provide you with exceptional heart care, we have created designated Provider Care Teams.  These Care Teams include your primary Cardiologist (physician) and Advanced Practice Providers (APPs -  Physician Assistants and Nurse Practitioners) who all work together to provide you with the care you need, when you need it.  We recommend signing up for the patient portal called "MyChart".  Sign up information is provided on this After Visit Summary.  MyChart is used to connect with  patients for Virtual Visits (Telemedicine).  Patients are able to view lab/test results, encounter notes, upcoming appointments, etc.  Non-urgent messages can be sent to your provider as well.   To learn more about what you can do with MyChart, go to [email or web address].    Your next appointment:   6 month(s)  Provider:   Parke Poisson, MD

## 2023-02-17 NOTE — Patient Instructions (Signed)
Medication Instructions:  START  nitrogylcerin 0.4 mg as needed  *If you need a refill on your cardiac medications before your next appointment, please call your pharmacy*    Follow-Up: At [facility], you and your health needs are our priority.  As part of our continuing mission to provide you with exceptional heart care, we have created designated Provider Care Teams.  These Care Teams include your primary Cardiologist (physician) and Advanced Practice Providers (APPs -  Physician Assistants and Nurse Practitioners) who all work together to provide you with the care you need, when you need it.  We recommend signing up for the patient portal called "MyChart".  Sign up information is provided on this After Visit Summary.  MyChart is used to connect with patients for Virtual Visits (Telemedicine).  Patients are able to view lab/test results, encounter notes, upcoming appointments, etc.  Non-urgent messages can be sent to your provider as well.   To learn more about what you can do with MyChart, go to [email or web address].    Your next appointment:   6 month(s)  Provider:   Parke Poisson, MD

## 2023-03-02 DIAGNOSIS — R509 Fever, unspecified: Secondary | ICD-10-CM | POA: Diagnosis not present

## 2023-03-02 DIAGNOSIS — R0981 Nasal congestion: Secondary | ICD-10-CM | POA: Diagnosis not present

## 2023-03-02 DIAGNOSIS — R051 Acute cough: Secondary | ICD-10-CM | POA: Diagnosis not present

## 2023-03-02 DIAGNOSIS — J309 Allergic rhinitis, unspecified: Secondary | ICD-10-CM | POA: Diagnosis not present

## 2023-03-02 DIAGNOSIS — R070 Pain in throat: Secondary | ICD-10-CM | POA: Diagnosis not present

## 2023-04-08 DIAGNOSIS — M25562 Pain in left knee: Secondary | ICD-10-CM | POA: Diagnosis not present

## 2023-04-09 ENCOUNTER — Other Ambulatory Visit: Payer: Self-pay | Admitting: Internal Medicine

## 2023-04-09 ENCOUNTER — Other Ambulatory Visit: Payer: Self-pay | Admitting: Nurse Practitioner

## 2023-04-22 DIAGNOSIS — M25562 Pain in left knee: Secondary | ICD-10-CM | POA: Diagnosis not present

## 2023-05-03 DIAGNOSIS — M25562 Pain in left knee: Secondary | ICD-10-CM | POA: Diagnosis not present

## 2023-05-09 ENCOUNTER — Telehealth (HOSPITAL_BASED_OUTPATIENT_CLINIC_OR_DEPARTMENT_OTHER): Payer: Self-pay | Admitting: *Deleted

## 2023-05-09 NOTE — Telephone Encounter (Signed)
   Name: Lawrence Henderson  DOB: 1955/11/12  MRN: [id number]  Primary Cardiologist: Parke Poisson, MD   Preoperative team, please contact this patient and set up a phone call appointment for further preoperative risk assessment. Please obtain consent and complete medication review. Thank you for your help.  Last seen by Dr. Jacques Navy on 02/17/2023  I confirm that guidance regarding antiplatelet and oral anticoagulation therapy has been completed and, if necessary, noted below.  Per office protocol, if patient is without any new symptoms or concerns at the time of their virtual visit, he/she may hold ASA for 7 days prior to procedure. Please resume ASA as soon as possible postprocedure, at the discretion of the surgeon.    I also confirmed the patient resides in the state of [place]. As per [facility] Medical Board telemedicine laws, the patient must reside in the state in which the provider is licensed.   Joni Reining, NP 05/09/2023, 3:52 PM [facility]

## 2023-05-09 NOTE — Telephone Encounter (Signed)
1st attempt to reach pt regarding surgical clearance and the need for a tele visit, left a message for pt to call back and ask for the preop team.  

## 2023-05-09 NOTE — Telephone Encounter (Signed)
   Pre-operative Risk Assessment    Patient Name: Lawrence Henderson  DOB: 05-May-1956 MRN: [id number]      Request for Surgical Clearance    Procedure:   LEFT KNEE SCOPE  Date of Surgery:  Clearance 05/30/23                                 Surgeon:  Ramond Marrow, MD Surgeon's Group or Practice Name:  [facility] Phone number:  [phone] Fax number:  [phone]   Type of Clearance Requested:   - Medical  - Pharmacy:  Hold Aspirin NOT INDICATED   Type of Anesthesia:  General    Additional requests/questions:    Wilhemina Cash   05/09/2023, 2:13 PM

## 2023-05-10 ENCOUNTER — Telehealth: Payer: Self-pay | Admitting: *Deleted

## 2023-05-10 NOTE — Telephone Encounter (Signed)
Pt has been scheduled tele pre op appt 05/18/23 ok per Joni Reining, DNP to add on due to med hold and date of procedure.     Patient Consent for Virtual Visit        Lawrence Henderson has provided verbal consent on 05/10/2023 for a virtual visit (video or telephone).   CONSENT FOR VIRTUAL VISIT FOR:  Lawrence Henderson  By participating in this virtual visit I agree to the following:  I hereby voluntarily request, consent and authorize [facility] and its employed or contracted physicians, physician assistants, nurse practitioners or other licensed health care professionals (the Practitioner), to provide me with telemedicine health care services (the "Services") as deemed necessary by the treating Practitioner. I acknowledge and consent to receive the Services by the Practitioner via telemedicine. I understand that the telemedicine visit will involve communicating with the Practitioner through live audiovisual communication technology and the disclosure of certain medical information by electronic transmission. I acknowledge that I have been given the opportunity to request an in-person assessment or other available alternative prior to the telemedicine visit and am voluntarily participating in the telemedicine visit.  I understand that I have the right to withhold or withdraw my consent to the use of telemedicine in the course of my care at any time, without affecting my right to future care or treatment, and that the Practitioner or I may terminate the telemedicine visit at any time. I understand that I have the right to inspect all information obtained and/or recorded in the course of the telemedicine visit and may receive copies of available information for a reasonable fee.  I understand that some of the potential risks of receiving the Services via telemedicine include:  Delay or interruption in medical evaluation due to technological equipment failure or disruption; Information  transmitted may not be sufficient (e.g. poor resolution of images) to allow for appropriate medical decision making by the Practitioner; and/or  In rare instances, security protocols could fail, causing a breach of personal health information.  Furthermore, I acknowledge that it is my responsibility to provide information about my medical history, conditions and care that is complete and accurate to the best of my ability. I acknowledge that Practitioner's advice, recommendations, and/or decision may be based on factors not within their control, such as incomplete or inaccurate data provided by me or distortions of diagnostic images or specimens that may result from electronic transmissions. I understand that the practice of medicine is not an exact science and that Practitioner makes no warranties or guarantees regarding treatment outcomes. I acknowledge that a copy of this consent can be made available to me via my patient portal [facility] MyChart), or I can request a printed copy by calling the office of [facility].    I understand that my insurance will be billed for this visit.   I have read or had this consent read to me. I understand the contents of this consent, which adequately explains the benefits and risks of the Services being provided via telemedicine.  I have been provided ample opportunity to ask questions regarding this consent and the Services and have had my questions answered to my satisfaction. I give my informed consent for the services to be provided through the use of telemedicine in my medical care

## 2023-05-10 NOTE — Telephone Encounter (Signed)
Pt has been scheduled tele pre op appt 05/18/23 ok per Joni Reining, DNP to add on due to med hold and date of procedure.

## 2023-05-10 NOTE — Telephone Encounter (Signed)
Ok per pre op APP Joni Reining, DNP ok to add on to 05/18/23 due to procedure date and med hold.

## 2023-05-10 NOTE — Telephone Encounter (Signed)
Patient is returning call.  °

## 2023-05-17 NOTE — Progress Notes (Unsigned)
Virtual Visit via Telephone Note   Because of Lawrence Henderson co-morbid illnesses, he is at least at moderate risk for complications without adequate follow up.  This format is felt to be most appropriate for this patient at this time.  The patient did not have access to video technology/had technical difficulties with video requiring transitioning to audio format only (telephone).  All issues noted in this document were discussed and addressed.  No physical exam could be performed with this format.  Please refer to the patient's chart for his consent to telehealth for [facility].  Evaluation Performed:  Preoperative cardiovascular risk assessment _____________   Date:  05/18/2023   Patient ID:  Lawrence Henderson, DOB 12/06/1955, MRN [id number] Patient Location:  Home Provider location:   Office  Primary Care Provider:  Lupita Raider, MD Primary Cardiologist:  Parke Poisson, MD  Chief Complaint / Patient Profile   66 y.o. y/o male with a h/o chest pain with low risk myoview in 2011 and 2018, coronary CTA with mild non obstructive CAD in 12/2022, chronic dyspnea since COVID PNA in 12/2019, HTN, HLD and obesity who is pending left knee scope and presents today for telephonic preoperative cardiovascular risk assessment.  History of Present Illness    Lawrence Henderson is a 67 y.o. male who presents via audio/video conferencing for a telehealth visit today.  Pt was last seen in cardiology clinic on 02/17/23 by Dr. Jacques Navy.  At that time [name] was doing well, he noted occasional chest tightness despite reassuring coronary CTA in 01/14/23 with nonobstructive CAD.  The patient is now pending procedure as outlined above. Since his last visit, he has remained stable from a cardiac perspective, he denies any further chest discomfort. He denies chest pain, palpitations, dyspnea, pnd, orthopnea, n, v, dizziness, syncope, edema, weight gain, or early satiety.   Past Medical  History    Past Medical History:  Diagnosis Date   Hyperlipidemia    Hypertension    Past Surgical History:  Procedure Laterality Date   APPENDECTOMY     arm surgery     right arm 2006   TONSILLECTOMY     Allergies Allergies  Allergen Reactions   Sulfa Antibiotics Anaphylaxis and Swelling    67 years old, Hospitalized for 2 weeks, unknown   Home Medications    Prior to Admission medications   Medication Sig Start Date End Date Taking? Authorizing Provider  amLODipine (NORVASC) 10 MG tablet TAKE 1 TABLET BY MOUTH EVERY DAY 04/11/23   Parke Poisson, MD  amLODipine (NORVASC) 5 MG tablet Take 1 tablet (5 mg total) by mouth daily. Patient not taking: Reported on 05/10/2023 10/27/22   Parke Poisson, MD  aspirin EC 81 MG tablet Take 81 mg by mouth daily.    [provider]  co-enzyme Q-10 30 MG capsule Take 30 mg by mouth daily.     [provider]  cyanocobalamin (VITAMIN B12) 1000 MCG tablet Take 1,000 mcg by mouth daily.    [provider]  ezetimibe (ZETIA) 10 MG tablet TAKE 1 TABLET BY MOUTH EVERY DAY 01/21/23   Joylene Grapes, NP  famotidine (PEPCID) 20 MG tablet Take 20 mg by mouth daily.    [provider]  hydrochlorothiazide (HYDRODIURIL) 25 MG tablet Take 1 tablet (25 mg total) by mouth daily. 01/14/23   Parke Poisson, MD  lisinopril (PRINIVIL,ZESTRIL) 20 MG tablet Take 20 mg by mouth daily. 01/13/15   [provider]  metoprolol tartrate (LOPRESSOR) 25 MG tablet Take 0.5 tablets (12.5 mg total) by mouth every morning AND 1 tablet (25 mg total) daily at 2 PM. 12/21/22   Parke Poisson, MD  Multiple Vitamin (MULTIVITAMIN WITH MINERALS) TABS tablet Take 1 tablet by mouth daily.    [provider]  nitroGLYCERIN (NITROSTAT) 0.4 MG SL tablet Place 1 tablet (0.4 mg total) under the tongue every 5 (five) minutes as needed for chest pain. 02/17/23 05/18/23  Parke Poisson, MD  rosuvastatin (CRESTOR) 20 MG tablet  Take 1 tablet (20 mg total) by mouth daily. 03/23/21   Parke Poisson, MD  vitamin C (ASCORBIC ACID) 500 MG tablet Take 500 mg by mouth daily.    [provider]  zinc gluconate 50 MG tablet Take 50 mg by mouth daily.    [provider]    Physical Exam    Vital Signs:  INFINITE NAYLOR does not have vital signs available for review today.  Given telephonic nature of communication, physical exam is limited. AAOx3. NAD. Normal affect.  Speech and respirations are unlabored.  Accessory Clinical Findings   None Assessment & Plan    1.  Preoperative Cardiovascular Risk Assessment: Left knee scope with Dr. Ramond Marrow with [facility].   Mr. Corona perioperative risk of a major cardiac event is 0.4% according to the Revised Cardiac Risk Index (RCRI).  Therefore, he is at low risk for perioperative complications.   His functional capacity is good at 7.68 METs according to the Duke Activity Status Index (DASI). Recommendations: According to ACC/AHA guidelines, no further cardiovascular testing needed.  The patient may proceed to surgery at acceptable risk.   Antiplatelet and/or Anticoagulation Recommendations: Patient may hold ASA for 7 days prior to procedure. Please resume ASA as soon as possible postprocedure, at the discretion of the surgeon.   The patient was advised that if he develops new symptoms prior to surgery to contact our office to arrange for a follow-up visit, and he verbalized understanding.  A copy of this note will be routed to requesting surgeon.  Time:   Today, I have spent 6 minutes with the patient with telehealth technology discussing medical history, symptoms, and management plan.     Rip Harbour, NP  05/18/2023, 10:49 AM

## 2023-05-18 ENCOUNTER — Ambulatory Visit: Payer: Medicare Other | Attending: Cardiovascular Disease | Admitting: Cardiology

## 2023-05-18 DIAGNOSIS — Z01810 Encounter for preprocedural cardiovascular examination: Secondary | ICD-10-CM

## 2023-05-18 IMAGING — CT CT HEART MORP W/ CTA COR W/ SCORE W/ CA W/CM &/OR W/O CM
1 series · 4 of 6 positions shown, 5 images · non-contrast
Comparison: Coronary CTA

Addendum:
HISTORY: 65 yo male with chest pain/anginal equiv, ECGs and troponins normal

EXAM:
Cardiac/Coronary CTA
TECHNIQUE: The patient was scanned on a Siemens Force scanner.
PROTOCOL: A 120 kV prospective scan was triggered in the descending thoracic
aorta at 111 HU's. Axial non-contrast 3 mm slices were carried out
through the heart. The data set was analyzed on a dedicated work
station and scored using the Agatson method. Gantry rotation speed
was 250 msecs and collimation was .6 mm. Beta blockade and 0.8 mg of
sl NTG was given. The 3D data set was reconstructed in 5% intervals
of the 35-75 % of the R-R cycle. Diastolic phases were analyzed on a
dedicated work station using MPR, MIP and VRT modes. The patient
received of contrast.

[Series 771: coronary arteries · 4 of 6 slices shown, 5 images]
[im 2/6  vessel]
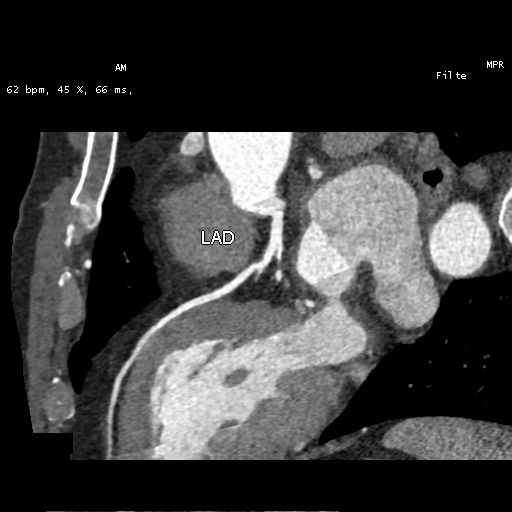
[im 2/6  lung]
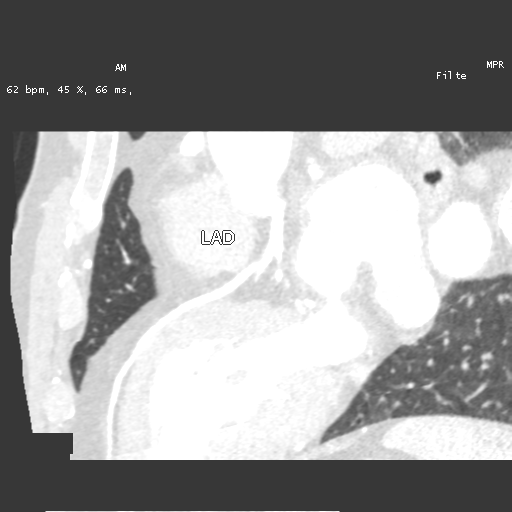
[im 3/6  vessel]
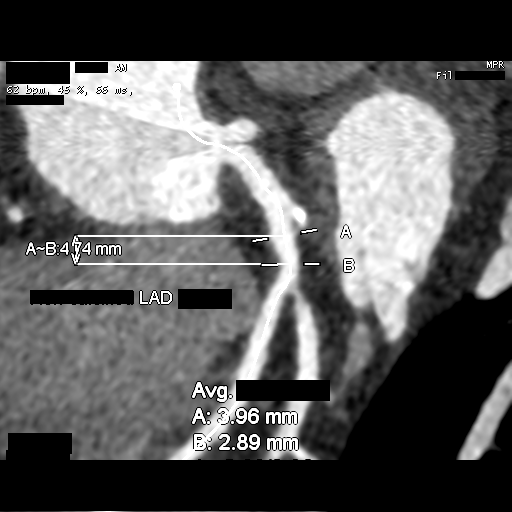
[im 4/6  vessel]
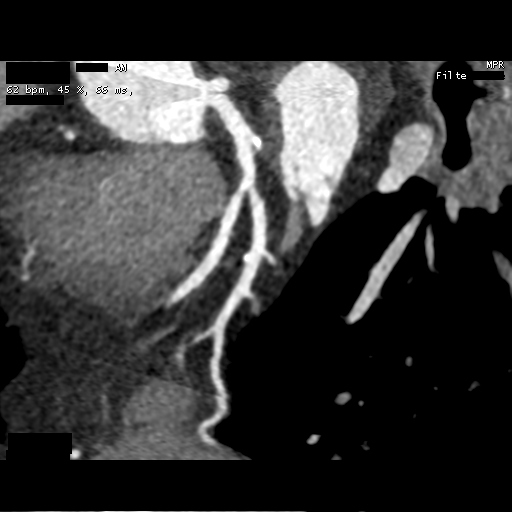
[im 5/6  vessel]
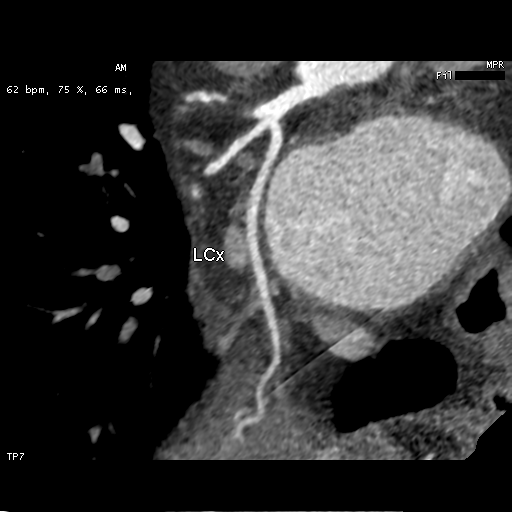

[4 of 6 positions shown; findings below may reference images not displayed]

FINDINGS: Quality: Very good, HR 62, attenuation artifact

Coronary calcium score: The patient's coronary artery calcium score
is 30, which places the patient in the 40th percentile.

Coronary arteries: Normal coronary origins.  Right dominance.

Right Coronary Artery: Dominant. Minimal 1-24% mixed distal vessel
stenosis (HI1HI1RD). Normal R-PDA and R-PLB branches.

Left Main Coronary Artery: Normal. Bifurcates into the LAD and LCx
arteries.

Left Anterior Descending Coronary Artery: Anterior vessel which
reaches the apex. There is a mild 25-49% eccentric non-calcified
stenosis of the proximal vessel, just prior to a small D1 takeoff
(K1WJ1W30). The D1 branch demonstrates minimal 1-24% proximal mixed
stenoses (HI1HI1RD).

Left Circumflex Artery: Smaller AV groove vessel without disease.
Larger high OM1 branch with minimal non-calcified proximal stenosis
(1-24%, HI1HI1RD).

Aorta: Normal size, 31 mm at the mid ascending aorta (level of the
PA bifurcation) measured double oblique. No calcifications. No
dissection.

Aortic Valve: Trileaflet.  No calcifications.

Other findings:

Normal pulmonary vein drainage into the left atrium.

Normal left atrial appendage without a thrombus.

Normal size of the pulmonary artery.
IMPRESSION: 1. Minimal to mild non-obstructive mixed CAD, CADRADS = 2.

2. Coronary calcium score of 30. This was 40th percentile for age
and sex matched control.

3. Normal coronary origin with right dominance.

4. Aggressive cardiovascular risk factor modification is
recommended.

EXAM:
OVER-READ INTERPRETATION  CT CHEST

The following report is an over-read performed by radiologist Dr.
over-read does not include interpretation of cardiac or coronary
anatomy or pathology. The coronary CTA interpretation by the
cardiologist is attached.
FINDINGS: Limited view of the lung parenchyma demonstrates no suspicious
nodularity. Airways are normal.

Limited view of the mediastinum demonstrates no adenopathy.
Esophagus normal.

Limited view of the upper abdomen unremarkable.

Limited view of the skeleton and chest wall is unremarkable.
IMPRESSION: No significant extracardiac

*** End of Addendum ***
FINDINGS: Quality: Very good, HR 62, attenuation artifact

Coronary calcium score: The patient's coronary artery calcium score
is 30, which places the patient in the 40th percentile.

Coronary arteries: Normal coronary origins.  Right dominance.

Right Coronary Artery: Dominant. Minimal 1-24% mixed distal vessel
stenosis (HI1HI1RD). Normal R-PDA and R-PLB branches.

Left Main Coronary Artery: Normal. Bifurcates into the LAD and LCx
arteries.

Left Anterior Descending Coronary Artery: Anterior vessel which
reaches the apex. There is a mild 25-49% eccentric non-calcified
stenosis of the proximal vessel, just prior to a small D1 takeoff
(K1WJ1W30). The D1 branch demonstrates minimal 1-24% proximal mixed
stenoses (HI1HI1RD).

Left Circumflex Artery: Smaller AV groove vessel without disease.
Larger high OM1 branch with minimal non-calcified proximal stenosis
(1-24%, HI1HI1RD).

Aorta: Normal size, 31 mm at the mid ascending aorta (level of the
PA bifurcation) measured double oblique. No calcifications. No
dissection.

Aortic Valve: Trileaflet.  No calcifications.

Other findings:

Normal pulmonary vein drainage into the left atrium.

Normal left atrial appendage without a thrombus.

Normal size of the pulmonary artery.
IMPRESSION: 1. Minimal to mild non-obstructive mixed CAD, CADRADS = 2.

2. Coronary calcium score of 30. This was 40th percentile for age
and sex matched control.

3. Normal coronary origin with right dominance.

4. Aggressive cardiovascular risk factor modification is
recommended.

## 2023-05-30 DIAGNOSIS — S83242A Other tear of medial meniscus, current injury, left knee, initial encounter: Secondary | ICD-10-CM | POA: Diagnosis not present

## 2023-05-30 DIAGNOSIS — S83232A Complex tear of medial meniscus, current injury, left knee, initial encounter: Secondary | ICD-10-CM | POA: Diagnosis not present

## 2023-05-30 DIAGNOSIS — M25862 Other specified joint disorders, left knee: Secondary | ICD-10-CM | POA: Diagnosis not present

## 2023-06-02 DIAGNOSIS — S83222D Peripheral tear of medial meniscus, current injury, left knee, subsequent encounter: Secondary | ICD-10-CM | POA: Diagnosis not present

## 2023-06-10 DIAGNOSIS — S83222D Peripheral tear of medial meniscus, current injury, left knee, subsequent encounter: Secondary | ICD-10-CM | POA: Diagnosis not present

## 2023-06-17 DIAGNOSIS — S83222D Peripheral tear of medial meniscus, current injury, left knee, subsequent encounter: Secondary | ICD-10-CM | POA: Diagnosis not present

## 2023-06-24 DIAGNOSIS — S83222D Peripheral tear of medial meniscus, current injury, left knee, subsequent encounter: Secondary | ICD-10-CM | POA: Diagnosis not present

## 2023-07-01 DIAGNOSIS — S83222D Peripheral tear of medial meniscus, current injury, left knee, subsequent encounter: Secondary | ICD-10-CM | POA: Diagnosis not present

## 2023-07-11 DIAGNOSIS — J3489 Other specified disorders of nose and nasal sinuses: Secondary | ICD-10-CM | POA: Diagnosis not present

## 2023-07-11 DIAGNOSIS — R519 Headache, unspecified: Secondary | ICD-10-CM | POA: Diagnosis not present

## 2023-07-11 DIAGNOSIS — J069 Acute upper respiratory infection, unspecified: Secondary | ICD-10-CM | POA: Diagnosis not present

## 2023-07-15 DIAGNOSIS — I10 Essential (primary) hypertension: Secondary | ICD-10-CM | POA: Diagnosis not present

## 2023-07-15 DIAGNOSIS — R7303 Prediabetes: Secondary | ICD-10-CM | POA: Diagnosis not present

## 2023-07-15 DIAGNOSIS — J988 Other specified respiratory disorders: Secondary | ICD-10-CM | POA: Diagnosis not present

## 2023-07-15 DIAGNOSIS — I2510 Atherosclerotic heart disease of native coronary artery without angina pectoris: Secondary | ICD-10-CM | POA: Diagnosis not present

## 2023-07-15 DIAGNOSIS — E782 Mixed hyperlipidemia: Secondary | ICD-10-CM | POA: Diagnosis not present

## 2023-07-15 LAB — LAB REPORT - SCANNED
A1c: 6
Creatinine, POC: 84 mg/dL
EGFR: 94

## 2023-07-29 DIAGNOSIS — H52203 Unspecified astigmatism, bilateral: Secondary | ICD-10-CM | POA: Diagnosis not present

## 2023-07-29 DIAGNOSIS — H4720 Unspecified optic atrophy: Secondary | ICD-10-CM | POA: Diagnosis not present

## 2023-07-29 DIAGNOSIS — H2513 Age-related nuclear cataract, bilateral: Secondary | ICD-10-CM | POA: Diagnosis not present

## 2023-07-29 DIAGNOSIS — H5340 Unspecified visual field defects: Secondary | ICD-10-CM | POA: Diagnosis not present

## 2023-08-05 DIAGNOSIS — R945 Abnormal results of liver function studies: Secondary | ICD-10-CM | POA: Diagnosis not present

## 2023-08-19 ENCOUNTER — Ambulatory Visit: Payer: Medicare Other | Admitting: Internal Medicine

## 2023-09-08 ENCOUNTER — Other Ambulatory Visit: Payer: Self-pay | Admitting: *Deleted

## 2023-09-08 DIAGNOSIS — Z79899 Other long term (current) drug therapy: Secondary | ICD-10-CM

## 2023-09-08 DIAGNOSIS — E781 Pure hyperglyceridemia: Secondary | ICD-10-CM

## 2023-09-08 NOTE — Progress Notes (Signed)
 Called and spoke to pt; results and recommendations given.  He is in agreement, understands it is a fasting lab.  He states he will come in, possibly tomorrow, to have Lipid Panel and Hepatic Function Panel drawn.  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  No cardiology related concerns for now.

## 2023-09-09 ENCOUNTER — Other Ambulatory Visit: Payer: Self-pay | Admitting: Internal Medicine

## 2023-09-09 DIAGNOSIS — Z79899 Other long term (current) drug therapy: Secondary | ICD-10-CM | POA: Diagnosis not present

## 2023-09-10 LAB — LIPID PANEL
Chol/HDL Ratio: 3.5 ratio (ref 0.0–5.0)
Cholesterol, Total: 173 mg/dL (ref 100–199)
HDL: 50 mg/dL (ref >39)
LDL Chol Calc (NIH): 88 mg/dL (ref 0–99)
Triglycerides: 211 mg/dL — ABNORMAL HIGH (ref 0–149)
VLDL Cholesterol Cal: 35 mg/dL (ref 5–40)

## 2023-09-10 LAB — HEPATIC FUNCTION PANEL
ALT: 23 IU/L (ref 0–44)
AST: 21 IU/L (ref 0–40)
Albumin: 4.7 g/dL (ref 3.9–4.9)
Alkaline Phosphatase: 65 IU/L (ref 44–121)
Bilirubin Total: 0.8 mg/dL (ref 0.0–1.2)
Bilirubin, Direct: 0.23 mg/dL (ref 0.00–0.40)
Total Protein: 6.8 g/dL (ref 6.0–8.5)

## 2023-09-16 ENCOUNTER — Encounter: Payer: Self-pay | Admitting: Internal Medicine

## 2023-10-05 ENCOUNTER — Other Ambulatory Visit: Payer: Self-pay | Admitting: *Deleted

## 2023-10-05 DIAGNOSIS — Z79899 Other long term (current) drug therapy: Secondary | ICD-10-CM

## 2023-10-05 DIAGNOSIS — E785 Hyperlipidemia, unspecified: Secondary | ICD-10-CM

## 2023-10-20 DIAGNOSIS — S83222D Peripheral tear of medial meniscus, current injury, left knee, subsequent encounter: Secondary | ICD-10-CM | POA: Diagnosis not present

## 2023-11-09 ENCOUNTER — Encounter: Payer: Self-pay | Admitting: Internal Medicine

## 2023-11-09 ENCOUNTER — Ambulatory Visit: Payer: Medicare Other | Attending: Internal Medicine | Admitting: Internal Medicine

## 2023-11-09 VITALS — BP 108/62 | HR 73 | Ht 71.0 in | Wt 244.0 lb

## 2023-11-09 DIAGNOSIS — I471 Supraventricular tachycardia, unspecified: Secondary | ICD-10-CM

## 2023-11-09 DIAGNOSIS — I10 Essential (primary) hypertension: Secondary | ICD-10-CM | POA: Diagnosis not present

## 2023-11-09 DIAGNOSIS — R7303 Prediabetes: Secondary | ICD-10-CM

## 2023-11-09 DIAGNOSIS — E785 Hyperlipidemia, unspecified: Secondary | ICD-10-CM

## 2023-11-09 DIAGNOSIS — I25119 Atherosclerotic heart disease of native coronary artery with unspecified angina pectoris: Secondary | ICD-10-CM

## 2023-11-09 NOTE — Patient Instructions (Signed)
 Medication Instructions:  No Changes  Lab Work: None  Follow-Up: At [facility], you and your health needs are our priority.  As part of our continuing mission to provide you with exceptional heart care, our providers are all part of one team.  This team includes your primary Cardiologist (physician) and Advanced Practice Providers or APPs (Physician Assistants and Nurse Practitioners) who all work together to provide you with the care you need, when you need it.  Your next appointment:   1 year(s)  Provider:   Gayatri A Acharya, MD     Other Instructions Please call us  or send a MyChart message with any Cardiology related questions/concerns.  [phone].  Thank you!

## 2023-11-09 NOTE — Progress Notes (Signed)
 Cardiology Office Note:  .   Date:  11/09/2023  ID:  Lawrence Henderson, DOB [birth year], MRN [id number] PCP: Glena Landau, MD  [facility] Providers Cardiologist:  Euell Herrlich, MD    History of Present Illness: .   Lawrence Henderson is a 68 y.o. male.  Discussed the use of AI scribe software for clinical note transcription with the patient, who gave verbal consent to proceed.  History of Present Illness Lawrence Henderson is a 68 year old male with hypertension, hyperlipidemia, and coronary artery disease who presents for follow-up of chest pain and blood pressure management.  He experienced an episode of chest pain since the last visit. Myocardial infarction was ruled out, and a coronary CT showed nonobstructive coronary artery disease, consistent with previous findings. He was prescribed nitroglycerin  for chest discomfort but has not used it yet. Occasional episodes of chest discomfort occur mostly at night and resolve by morning without intervention.  Chronic dyspnea persists since COVID pneumonia in 2021, limiting physical activities. He attributes this to strength and breathing issues. No chest discomfort occurs during physical exertion.  Current medications include metoprolol  tartrate 25 mg in the morning, amlodipine  5 mg daily, lisinopril  20 mg daily, HCTZ 25 mg daily, Zetia  10 mg daily, Crestor  20 mg daily, and a baby aspirin . Metoprolol  is taken only in the morning due to concerns about retinal perfusion. Occasional higher blood pressure readings occur in the evening.  He is working on weight loss and has lost about six pounds since the last visit. He is mindful of his diet, focusing on reducing added sugars and increasing protein intake. He is prediabetic, with a family history of diabetes in his grandfather, and is managing his condition through dietary changes.  Occasional low blood pressure readings are attributed to possible dehydration and the use of Z-Quil for  sleep.    ROS: negative except per HPI above.  Studies Reviewed: Aaron Aas   EKG Interpretation Date/Time:  Wednesday Nov 09 2023 08:46:07 EDT Ventricular Rate:  73 PR Interval:  144 QRS Duration:  94 QT Interval:  390 QTC Calculation: 429 R Axis:   -5  Text Interpretation: Normal sinus rhythm Normal ECG When compared with ECG of 08-Jan-2023 18:29, PR interval has decreased Confirmed by Grady Lawman ([id number]) on 11/09/2023 9:21:08 AM    Results LABS LDL: 88 (08/2023) Hemoglobin A1c: 5.7% (Prediabetic)  RADIOLOGY Coronary CT: Nonobstructive CAD similar to prior  DIAGNOSTIC EKG: Normal (11/09/2023) Risk Assessment/Calculations:       Physical Exam:   VS:  BP 108/62   Pulse 73   Ht 5\' 11"  (1.803 m)   Wt 244 lb (110.7 kg)   SpO2 97%   BMI 34.03 kg/m    Wt Readings from Last 3 Encounters:  11/09/23 244 lb (110.7 kg)  02/17/23 250 lb (113.4 kg)  01/10/23 242 lb 3.2 oz (109.9 kg)     Physical Exam VITALS: BP- 108/62 GENERAL: Alert, cooperative, well developed, no acute distress. HEENT: Normocephalic, normal oropharynx, moist mucous membranes. CHEST: Clear to auscultation bilaterally, no wheezes, rhonchi, or crackles. CARDIOVASCULAR: Normal heart rate and rhythm, S1 and S2 normal without murmurs. ABDOMEN: Soft, non-tender, non-distended, without organomegaly, normal bowel sounds. EXTREMITIES: No cyanosis or edema. NEUROLOGICAL: Cranial nerves grossly intact, moves all extremities without gross motor or sensory deficit, EKG normal.   ASSESSMENT AND PLAN: .    Assessment and Plan Assessment & Plan Nonobstructive coronary artery disease Nonobstructive CAD confirmed by coronary CT. Episodes of chest discomfort possibly  related to microvascular angina due to elevated blood pressure. Blood pressure today is atypically low at 108/62, possibly due to dehydration or medication effects. Ophthalmologist advised against evening dose of metoprolol  to ensure adequate retinal  perfusion. - Advise to keep nitroglycerin  available for chest discomfort. - Encourage hydration.  Chronic dyspnea post-COVID pneumonia Chronic dyspnea since COVID pneumonia in 2021. No new episodes of chest pain reported.  Hypertension Blood pressure management includes HCTZ, amlodipine , lisinopril , and metoprolol . Blood pressure today is lower than usual, possibly due to dehydration or medication effects. Ophthalmologist advised against evening dose of metoprolol . - Continue current antihypertensive regimen: metoprolol  tartrate 25 mg in the morning, amlodipine  5 mg daily, lisinopril  20 mg daily, HCTZ 25 mg daily, - Encourage hydration.  Supraventricular tachycardia (SVT) Brief episodes of SVT noted on prior monitor. Metoprolol  prescribed for management. - Continue metoprolol  tartrate 25 mg in the morning. - Use additional half tablet of metoprolol  in the evening as needed for significant palpitations.  Hyperlipidemia On Zetia  and Crestor . LDL is 88, acceptable with current medication. Triglycerides elevated, likely due to dietary intake of refined carbohydrates. - Continue current lipid-lowering therapy. - Advise dietary modifications to reduce refined carbohydrate and added sugar intake.  Prediabetes Hemoglobin A1c indicates prediabetes. Prefers lifestyle modifications over medication. Family history of diabetes noted. - Advise dietary modifications to reduce added sugar intake. - Encourage weight loss and increased protein intake.  Knee arthritis Knee surgery in December. Recent injection for pain management. Plans for gel injections next month.       Grady Lawman, MD, FACC

## 2023-11-29 DIAGNOSIS — M1712 Unilateral primary osteoarthritis, left knee: Secondary | ICD-10-CM | POA: Diagnosis not present

## 2023-12-09 DIAGNOSIS — M1712 Unilateral primary osteoarthritis, left knee: Secondary | ICD-10-CM | POA: Diagnosis not present

## 2023-12-13 DIAGNOSIS — M25552 Pain in left hip: Secondary | ICD-10-CM | POA: Diagnosis not present

## 2023-12-13 DIAGNOSIS — M1712 Unilateral primary osteoarthritis, left knee: Secondary | ICD-10-CM | POA: Diagnosis not present

## 2023-12-13 DIAGNOSIS — M25511 Pain in right shoulder: Secondary | ICD-10-CM | POA: Diagnosis not present

## 2023-12-19 DIAGNOSIS — M1612 Unilateral primary osteoarthritis, left hip: Secondary | ICD-10-CM | POA: Diagnosis not present

## 2023-12-31 ENCOUNTER — Other Ambulatory Visit: Payer: Self-pay | Admitting: Internal Medicine

## 2024-01-02 ENCOUNTER — Other Ambulatory Visit: Payer: Self-pay

## 2024-01-02 MED ORDER — EZETIMIBE 10 MG PO TABS: 10.0000 mg | ORAL_TABLET | Freq: Every day | ORAL | 3 refills | Status: AC

## 2024-01-05 ENCOUNTER — Telehealth: Payer: Self-pay | Admitting: Internal Medicine

## 2024-01-05 MED ORDER — HYDROCHLOROTHIAZIDE 25 MG PO TABS: 25.0000 mg | ORAL_TABLET | Freq: Every day | ORAL | 3 refills | Status: AC

## 2024-01-05 NOTE — Telephone Encounter (Signed)
 Pt's medication was sent to pt's pharmacy as requested. Confirmation received.

## 2024-01-05 NOTE — Telephone Encounter (Signed)
*  STAT* If patient is at the pharmacy, call can be transferred to refill team.   1. Which medications need to be refilled? (please list name of each medication and dose if known)  hydrochlorothiazide  (HYDRODIURIL ) 25 MG tablet  2. Which pharmacy/location (including street and city if local pharmacy) is medication to be sent to? [id number] [place], [place] - [address] AT [facility] [phone]   3. Do they need a 30 day or 90 day supply? 90

## 2024-01-06 DIAGNOSIS — M25512 Pain in left shoulder: Secondary | ICD-10-CM | POA: Diagnosis not present

## 2024-01-06 DIAGNOSIS — M25511 Pain in right shoulder: Secondary | ICD-10-CM | POA: Diagnosis not present

## 2024-01-18 ENCOUNTER — Ambulatory Visit: Payer: Self-pay | Admitting: *Deleted

## 2024-01-18 DIAGNOSIS — E785 Hyperlipidemia, unspecified: Secondary | ICD-10-CM

## 2024-01-18 DIAGNOSIS — Z79899 Other long term (current) drug therapy: Secondary | ICD-10-CM

## 2024-01-22 ENCOUNTER — Other Ambulatory Visit: Payer: Self-pay | Admitting: Internal Medicine

## 2024-01-27 DIAGNOSIS — M25511 Pain in right shoulder: Secondary | ICD-10-CM | POA: Diagnosis not present

## 2024-01-27 DIAGNOSIS — M25512 Pain in left shoulder: Secondary | ICD-10-CM | POA: Diagnosis not present

## 2024-01-30 ENCOUNTER — Telehealth: Payer: Self-pay | Admitting: Internal Medicine

## 2024-01-30 MED ORDER — METOPROLOL TARTRATE 25 MG PO TABS: 25.0000 mg | ORAL_TABLET | Freq: Every day | ORAL | 2 refills | Status: AC

## 2024-01-30 NOTE — Telephone Encounter (Signed)
 *  STAT* If patient is at the pharmacy, call can be transferred to refill team.   1. Which medications need to be refilled? (please list name of each medication and dose if known) metoprolol  tartrate (LOPRESSOR ) 25 MG tablet    2. Would you like to learn more about the convenience, safety, & potential cost savings by using the [facility]? No      3. Are you open to using the [facility] (Type [facility]. ). No    4. Which pharmacy/location (including street and city if local pharmacy) is medication to be sent to? [id number] [place], [place] - [address] [facility]     5. Do they need a 30 day or 90 day supply? 90 days

## 2024-02-03 DIAGNOSIS — M25511 Pain in right shoulder: Secondary | ICD-10-CM | POA: Diagnosis not present

## 2024-02-03 DIAGNOSIS — M25512 Pain in left shoulder: Secondary | ICD-10-CM | POA: Diagnosis not present

## 2024-02-10 DIAGNOSIS — U099 Post covid-19 condition, unspecified: Secondary | ICD-10-CM | POA: Diagnosis not present

## 2024-02-10 DIAGNOSIS — K5790 Diverticulosis of intestine, part unspecified, without perforation or abscess without bleeding: Secondary | ICD-10-CM | POA: Diagnosis not present

## 2024-02-10 DIAGNOSIS — E782 Mixed hyperlipidemia: Secondary | ICD-10-CM | POA: Diagnosis not present

## 2024-02-10 DIAGNOSIS — R7303 Prediabetes: Secondary | ICD-10-CM | POA: Diagnosis not present

## 2024-02-10 DIAGNOSIS — M2550 Pain in unspecified joint: Secondary | ICD-10-CM | POA: Diagnosis not present

## 2024-02-10 DIAGNOSIS — I2510 Atherosclerotic heart disease of native coronary artery without angina pectoris: Secondary | ICD-10-CM | POA: Diagnosis not present

## 2024-02-10 DIAGNOSIS — Z0000 Encounter for general adult medical examination without abnormal findings: Secondary | ICD-10-CM | POA: Diagnosis not present

## 2024-02-10 DIAGNOSIS — I10 Essential (primary) hypertension: Secondary | ICD-10-CM | POA: Diagnosis not present

## 2024-02-11 LAB — LAB REPORT - SCANNED
A1c: 6.4
EGFR: 102
PSA, FREE: 2.34

## 2024-02-12 ENCOUNTER — Other Ambulatory Visit: Payer: Self-pay

## 2024-02-12 ENCOUNTER — Encounter (HOSPITAL_COMMUNITY): Payer: Self-pay

## 2024-02-12 ENCOUNTER — Emergency Department (HOSPITAL_COMMUNITY)

## 2024-02-12 ENCOUNTER — Emergency Department (HOSPITAL_COMMUNITY)
Admission: EM | Admit: 2024-02-12 | Discharge: 2024-02-12 | Disposition: A | Discharge status: Home / Self Care | Attending: Emergency Medicine | Admitting: Emergency Medicine

## 2024-02-12 DIAGNOSIS — U071 COVID-19: Secondary | ICD-10-CM | POA: Insufficient documentation

## 2024-02-12 DIAGNOSIS — Z7982 Long term (current) use of aspirin: Secondary | ICD-10-CM | POA: Insufficient documentation

## 2024-02-12 DIAGNOSIS — R059 Cough, unspecified: Secondary | ICD-10-CM | POA: Diagnosis not present

## 2024-02-12 DIAGNOSIS — I517 Cardiomegaly: Secondary | ICD-10-CM | POA: Diagnosis not present

## 2024-02-12 DIAGNOSIS — R509 Fever, unspecified: Secondary | ICD-10-CM | POA: Diagnosis not present

## 2024-02-12 LAB — RESP PANEL BY RT-PCR (RSV, FLU A&B, COVID)  RVPGX2
Influenza A by PCR: NEGATIVE
Influenza B by PCR: NEGATIVE
Resp Syncytial Virus by PCR: NEGATIVE
SARS Coronavirus 2 by RT PCR: POSITIVE — AB

## 2024-02-12 MED ORDER — PAXLOVID (300/100) 20 X 150 MG & 10 X 100MG PO TBPK: 3.0000 | ORAL_TABLET | Freq: Two times a day (BID) | ORAL | 0 refills | Status: AC

## 2024-02-12 NOTE — ED Triage Notes (Signed)
 Pt coming in with fever and chills . Pt reports joint pain. Generalized body aches. Pt reports cough.

## 2024-02-12 NOTE — Discharge Instructions (Addendum)
 Your COVID-19 test is positive.  For now, use medicine such as Tylenol  to help with fever and your symptoms.  Follow-up with your primary care physician as scheduled or call if you are not improving.  However if you develop new or worsening shortness of breath, coughing up blood, chest pain, or any other new/concerning symptoms then return to the ER.

## 2024-02-12 NOTE — ED Notes (Signed)
 Pt reports going camping this weekend and they are concerned for a possible tick bite

## 2024-02-12 NOTE — ED Provider Notes (Signed)
 [name] EMERGENCY DEPARTMENT AT [facility] Provider Note   CSN: [id number] Arrival date & time: 02/12/24  1556     Patient presents with: Fever   Lawrence Henderson is a 68 y.o. male.   HPI 68 year old male presents with a fever and chills.  Patient got back from camping starting 3 days ago today.  Last night he started having some chills and his temperature today was 100.5.  He has had a cough and a slight scratchy throat.  He thinks he is a little more short of breath than normal but always has some degree of shortness of breath from a previous COVID infection in 2020.  He has been dealing with 3+ weeks of joint and muscle pain and has had MRIs and workup by his PCP.  These pains are not new or worse with this acute illness.  He did go camping but states he did not note an obvious tick, does not have a headache, and has not had a rash.  No abdominal pain or vomiting.  Prior to Admission medications   Medication Sig Start Date End Date Taking? Authorizing Provider  nirmatrelvir/ritonavir (PAXLOVID , 300/100,) 20 x 150 MG & 10 x 100MG  TBPK Take 3 tablets by mouth 2 (two) times daily for 5 days. Take nirmatrelvir (150 mg) two tablets twice daily for 5 days and ritonavir (100 mg) one tablet twice daily for 5 days. 02/12/24 02/17/24 Yes Freddi Hamilton, MD  amLODipine  (NORVASC ) 10 MG tablet TAKE 1 TABLET BY MOUTH EVERY DAY 04/11/23   Loni Soyla LABOR, MD  aspirin  EC 81 MG tablet Take 81 mg by mouth daily.    [provider]  co-enzyme Q-10 30 MG capsule Take 30 mg by mouth daily.     [provider]  cyanocobalamin (VITAMIN B12) 1000 MCG tablet Take 1,000 mcg by mouth daily.    [provider]  ezetimibe  (ZETIA ) 10 MG tablet Take 1 tablet (10 mg total) by mouth daily. 01/02/24   Acharya, Gayatri A, MD  famotidine  (PEPCID ) 20 MG tablet Take 20 mg by mouth daily.    [provider]  hydrochlorothiazide  (HYDRODIURIL ) 25 MG tablet Take 1 tablet (25 mg  total) by mouth daily. 01/05/24   Acharya, Gayatri A, MD  lisinopril  (PRINIVIL ,ZESTRIL ) 20 MG tablet Take 20 mg by mouth daily. 01/13/15   [provider]  metoprolol  tartrate (LOPRESSOR ) 25 MG tablet Take 1 tablet (25 mg total) by mouth daily at 6 (six) AM. 01/30/24   Loni Soyla LABOR, MD  Multiple Vitamin (MULTIVITAMIN WITH MINERALS) TABS tablet Take 1 tablet by mouth daily.    [provider]  nitroGLYCERIN  (NITROSTAT ) 0.4 MG SL tablet Place 1 tablet (0.4 mg total) under the tongue every 5 (five) minutes as needed for chest pain. 02/17/23 11/09/23  Acharya, Gayatri A, MD  rosuvastatin  (CRESTOR ) 20 MG tablet Take 1 tablet (20 mg total) by mouth daily. 03/23/21   Acharya, Gayatri A, MD  vitamin C (ASCORBIC ACID ) 500 MG tablet Take 500 mg by mouth daily.    [provider]  zinc  gluconate 50 MG tablet Take 50 mg by mouth daily.    [provider]    Allergies: Sulfa antibiotics    Review of Systems  Constitutional:  Positive for chills and fever.  HENT:  Positive for sore throat.   Respiratory:  Positive for cough and shortness of breath.   Gastrointestinal:  Negative for abdominal pain and vomiting.  Musculoskeletal:  Positive for arthralgias.  Skin:  Negative for rash.  Neurological:  Negative for headaches.    Updated Vital Signs BP 138/80 (BP Location: Right Arm)   Pulse 99   Temp 98.2 F (36.8 C) (Oral)   Resp 16   Ht 5' 11 (1.803 m)   Wt 108 kg   SpO2 95%   BMI 33.19 kg/m   Physical Exam Vitals and nursing note reviewed.  Constitutional:      General: He is not in acute distress.    Appearance: He is well-developed. He is not ill-appearing or diaphoretic.  HENT:     Head: Normocephalic and atraumatic.  Cardiovascular:     Rate and Rhythm: Normal rate and regular rhythm.     Heart sounds: Normal heart sounds.  Pulmonary:     Effort: Pulmonary effort is normal.     Breath sounds: Normal breath sounds. No wheezing, rhonchi or rales.   Abdominal:     General: There is no distension.     Palpations: Abdomen is soft.     Tenderness: There is no abdominal tenderness.  Musculoskeletal:     Cervical back: No rigidity.     Comments: He has limited range of motion of both shoulders which she states has been ongoing for weeks and he just had MRI.  Otherwise no obvious joint effusions to his major joints in the upper or lower extremities.  Skin:    General: Skin is warm and dry.     Findings: No rash.  Neurological:     Mental Status: He is alert.     (all labs ordered are listed, but only abnormal results are displayed) Labs Reviewed  RESP PANEL BY RT-PCR (RSV, FLU A&B, COVID)  RVPGX2 - Abnormal; Notable for the following components:      Result Value   SARS Coronavirus 2 by RT PCR POSITIVE (*)    All other components within normal limits    EKG: None  Radiology: DG Chest 2 View Result Date: 02/12/2024 CLINICAL DATA:  Cough and fever EXAM: CHEST - 2 VIEW COMPARISON:  01/08/2023 FINDINGS: Mild cardiomegaly. No acute airspace disease, pleural effusion or pneumothorax. IMPRESSION: No active cardiopulmonary disease. Mild cardiomegaly. Electronically Signed   By: Luke Bun M.D.   On: 02/12/2024 18:04     Procedures   Medications Ordered in the ED - No data to display                                  Medical Decision Making Amount and/or Complexity of Data Reviewed Labs: ordered.    Details: Positive COVID test Radiology: ordered and independent interpretation performed.    Details: No pneumonia  Risk Prescription drug management.   COVID test is positive.  I think this explains his symptoms.  I have a pretty low suspicion this is a tickborne illness as he has no headache and he has otherwise what sounds like respiratory symptoms.  Patient states he has been getting a workup such as for rheumatoid arthritis by his PCP.  He just had lab work drawn a few days ago.  Discussed potentially doing lab work  given his symptoms such as for a metabolic panel and CK.  He declines blood work and states his PCP is doing significant blood test.  Ultimately, patient would like to have Paxlovid  ordered as he had a pretty severe bout of COVID back in 2020.  We discussed potential risks versus potential benefits  and he would like to proceed.  Otherwise his vital signs are normal, no hypoxia or increased work of breathing.  He is stable for outpatient supportive care for his COVID.  Given return precautions.     Final diagnoses:  COVID-19    ED Discharge Orders          Ordered    nirmatrelvir/ritonavir (PAXLOVID , 300/100,) 20 x 150 MG & 10 x 100MG  TBPK  2 times daily        02/12/24 1833               Freddi Hamilton, MD 02/12/24 1859

## 2024-02-12 NOTE — ED Notes (Signed)
 Pt reports taking tylonol, celebrex and a muscle relaxer this am at 0700

## 2024-02-14 ENCOUNTER — Ambulatory Visit: Payer: Self-pay | Admitting: Internal Medicine

## 2024-02-16 ENCOUNTER — Other Ambulatory Visit: Payer: Self-pay | Admitting: Internal Medicine

## 2024-02-23 DIAGNOSIS — M25511 Pain in right shoulder: Secondary | ICD-10-CM | POA: Diagnosis not present

## 2024-02-24 ENCOUNTER — Telehealth: Payer: Self-pay

## 2024-02-24 ENCOUNTER — Other Ambulatory Visit: Payer: Self-pay | Admitting: Orthopaedic Surgery

## 2024-02-24 DIAGNOSIS — G8929 Other chronic pain: Secondary | ICD-10-CM

## 2024-02-24 NOTE — Telephone Encounter (Signed)
 Appointment scheduled for 03/07/24. Call patient at [phone]. Med req and consent are complete.

## 2024-02-24 NOTE — Telephone Encounter (Signed)
  Patient Consent for Virtual Visit         Lawrence Henderson has provided verbal consent on 02/24/2024 for a virtual visit (video or telephone). Appointment scheduled on 03/07/2024 @ 9:20am. Call patient at [phone]. Med req and consent are complete.   CONSENT FOR VIRTUAL VISIT FOR:  Lawrence Henderson  By participating in this virtual visit I agree to the following:  I hereby voluntarily request, consent and authorize [facility] and its employed or contracted physicians, physician assistants, nurse practitioners or other licensed health care professionals (the Practitioner), to provide me with telemedicine health care services (the "Services) as deemed necessary by the treating Practitioner. I acknowledge and consent to receive the Services by the Practitioner via telemedicine. I understand that the telemedicine visit will involve communicating with the Practitioner through live audiovisual communication technology and the disclosure of certain medical information by electronic transmission. I acknowledge that I have been given the opportunity to request an in-person assessment or other available alternative prior to the telemedicine visit and am voluntarily participating in the telemedicine visit.  I understand that I have the right to withhold or withdraw my consent to the use of telemedicine in the course of my care at any time, without affecting my right to future care or treatment, and that the Practitioner or I may terminate the telemedicine visit at any time. I understand that I have the right to inspect all information obtained and/or recorded in the course of the telemedicine visit and may receive copies of available information for a reasonable fee.  I understand that some of the potential risks of receiving the Services via telemedicine include:  Delay or interruption in medical evaluation due to technological equipment failure or disruption; Information transmitted may not be  sufficient (e.g. poor resolution of images) to allow for appropriate medical decision making by the Practitioner; and/or  In rare instances, security protocols could fail, causing a breach of personal health information.  Furthermore, I acknowledge that it is my responsibility to provide information about my medical history, conditions and care that is complete and accurate to the best of my ability. I acknowledge that Practitioner's advice, recommendations, and/or decision may be based on factors not within their control, such as incomplete or inaccurate data provided by me or distortions of diagnostic images or specimens that may result from electronic transmissions. I understand that the practice of medicine is not an exact science and that Practitioner makes no warranties or guarantees regarding treatment outcomes. I acknowledge that a copy of this consent can be made available to me via my patient portal [facility] MyChart), or I can request a printed copy by calling the office of [facility].    I understand that my insurance will be billed for this visit.   I have read or had this consent read to me. I understand the contents of this consent, which adequately explains the benefits and risks of the Services being provided via telemedicine.  I have been provided ample opportunity to ask questions regarding this consent and the Services and have had my questions answered to my satisfaction. I give my informed consent for the services to be provided through the use of telemedicine in my medical care

## 2024-02-24 NOTE — Telephone Encounter (Signed)
 Called patient about his preop appointment. He asked about some recent lab results. I have discussed results with him.

## 2024-02-24 NOTE — Telephone Encounter (Signed)
   Name: Lawrence Henderson  DOB: Jun 07, 1956  MRN: [id number]  Primary Cardiologist: Soyla DELENA Merck, MD   Preoperative team, please contact this patient and set up a phone call appointment for further preoperative risk assessment. Please obtain consent and complete medication review. Thank you for your help.  I confirm that guidance regarding antiplatelet and oral anticoagulation therapy has been completed and, if necessary, noted below.  Per office protocol, if patient is without any new symptoms or concerns at the time of their virtual visit, he may hold aspirin  for 7 days prior to procedure. Please resume aspirin  as soon as possible postprocedure, at the discretion of the surgeon.    I also confirmed the patient resides in the state of [place] . As per [facility] [employer] telemedicine laws, the patient must reside in the state in which the provider is licensed.   Lamarr Satterfield, NP 02/24/2024, 9:39 AM [facility]

## 2024-02-24 NOTE — Telephone Encounter (Signed)
   Pre-operative Risk Assessment    Patient Name: Lawrence Henderson  DOB: 01-18-1956 MRN: [id number]   Date of last office visit: 11/09/23 SOYLA MERCK, MD Date of next office visit: NONE   Request for Surgical Clearance    Procedure:  RIGHT REVERSE TOTAL SHOULDER W/ HARDWARE REMOVAL  Date of Surgery:  Clearance TBD                                Surgeon:  BONNER HAIR, MD Surgeon's Group or Practice Name:  [facility] Phone number:  [phone]  EXT [phone] Fax number:  [phone]  ATTN: MAEOLA DIVERS   Type of Clearance Requested:   - Medical  - Pharmacy:  Hold Aspirin      Type of Anesthesia:  General   WITH INTERSCALENE BLOCK   Additional requests/questions:    SignedLucie DELENA Ku   02/24/2024, 9:26 AM

## 2024-02-28 ENCOUNTER — Ambulatory Visit
Admission: RE | Admit: 2024-02-28 | Discharge: 2024-02-28 | Disposition: A | Discharge status: Home / Self Care | Source: Ambulatory Visit | Attending: Orthopaedic Surgery | Admitting: Orthopaedic Surgery

## 2024-02-28 DIAGNOSIS — M19011 Primary osteoarthritis, right shoulder: Secondary | ICD-10-CM | POA: Diagnosis not present

## 2024-02-28 DIAGNOSIS — G8929 Other chronic pain: Secondary | ICD-10-CM

## 2024-03-01 DIAGNOSIS — M7522 Bicipital tendinitis, left shoulder: Secondary | ICD-10-CM | POA: Diagnosis not present

## 2024-03-01 IMAGING — CT CT ABD-PELV W/ CM
2 of 5 series · 14 of 46 positions shown, 16 images · IV contrast (agent unspecified)
Comparison: CT September 03, 2016

CLINICAL DATA: Left-sided abdominal pain, constipation, dark urine.
History of diverticulitis. Prior appendectomy.

EXAM:
CT ABDOMEN AND PELVIS WITH CONTRAST
TECHNIQUE: Multidetector CT imaging of the abdomen and pelvis was performed
using the standard protocol following bolus administration of
intravenous contrast.

[Series 2: abd pelvis 5.00 br40 s3 axial · axial · 0.69mm/px · z∈[+1058,+1508]mm · 11 of 102 slices shown, 13 images]
[im 6/102  soft-tissue]
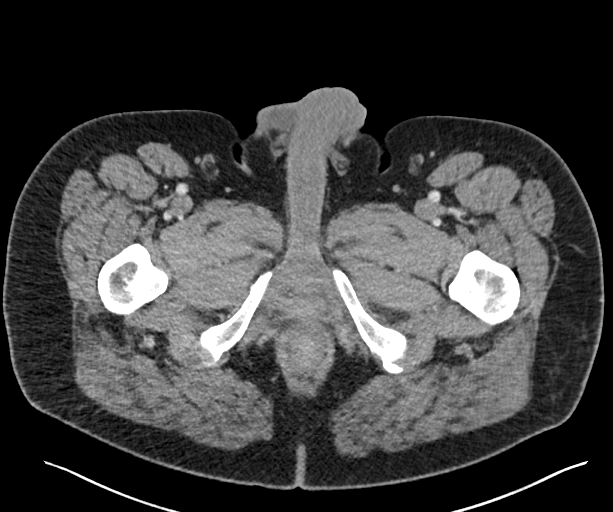
[im 6/102  bone]
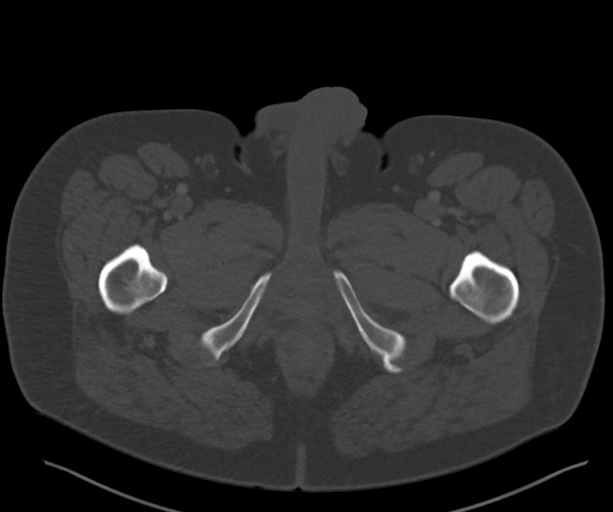
[im 16/102  soft-tissue]
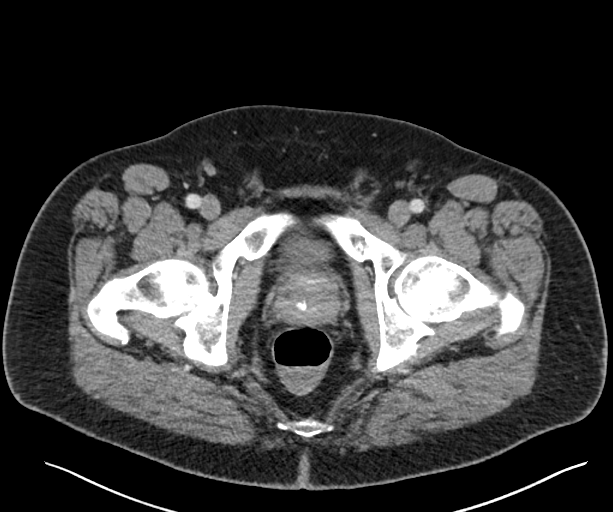
[im 27/102  soft-tissue]
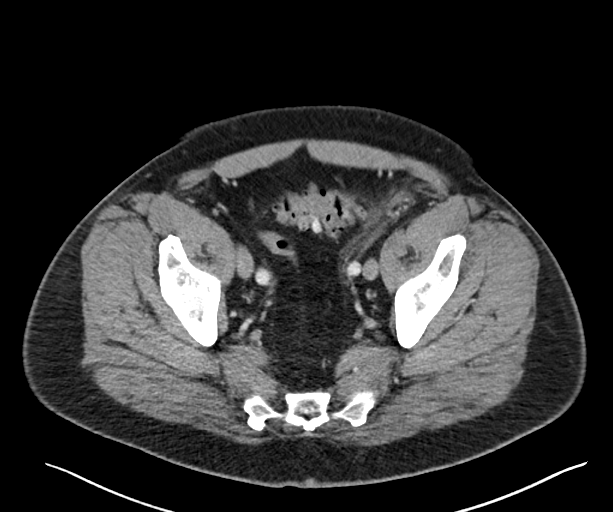
[im 32/102  soft-tissue]
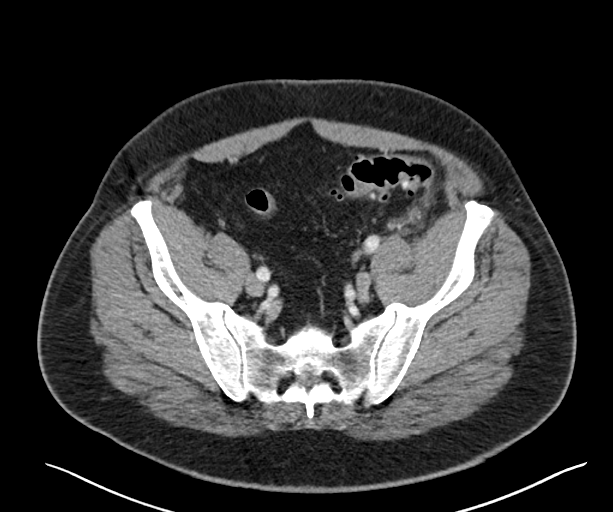
[im 43/102  soft-tissue]
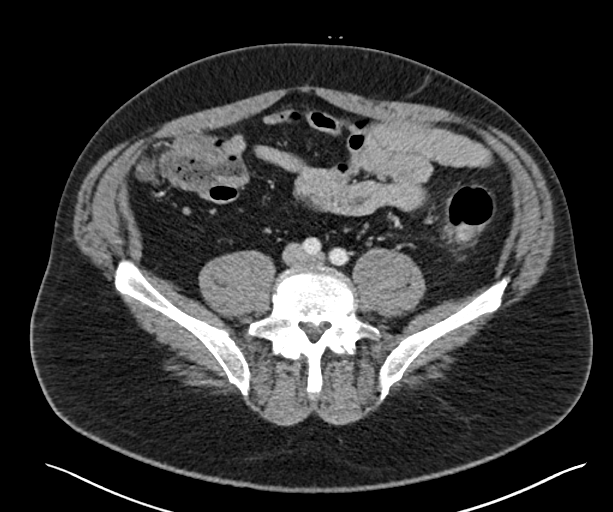
[im 54/102  soft-tissue]
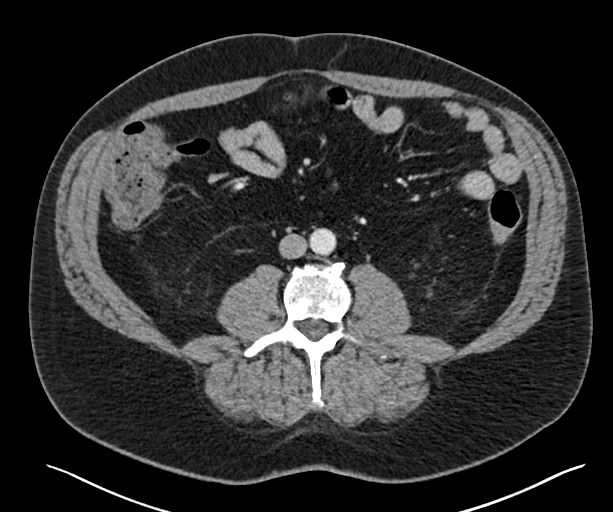
[im 59/102  soft-tissue]
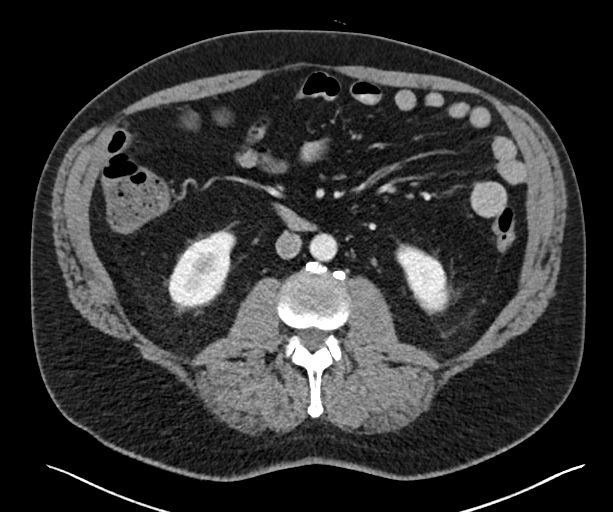
[im 70/102  soft-tissue]
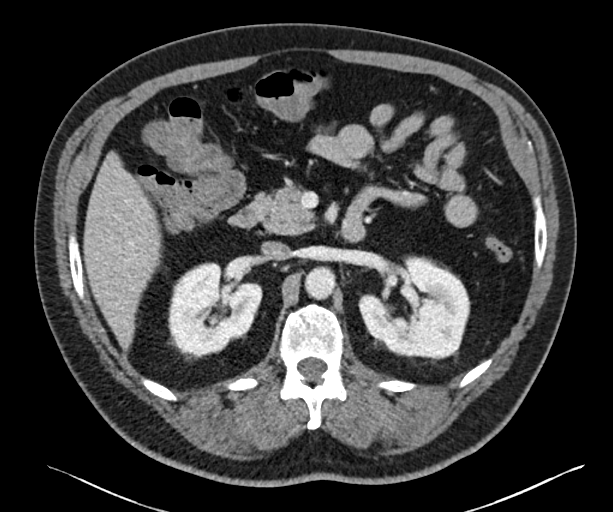
[im 75/102  soft-tissue]
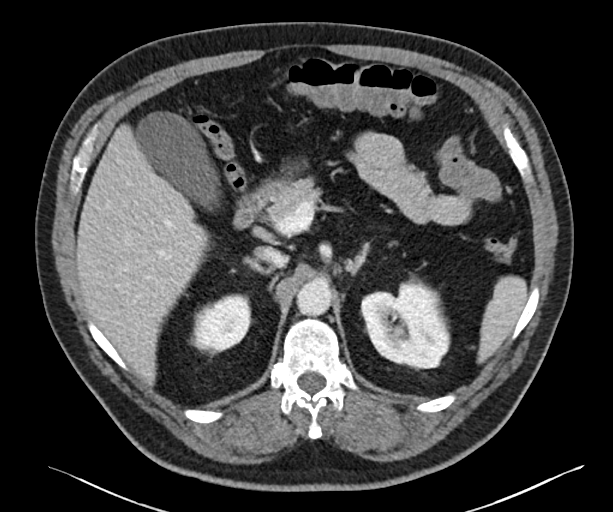
[im 75/102  bone]
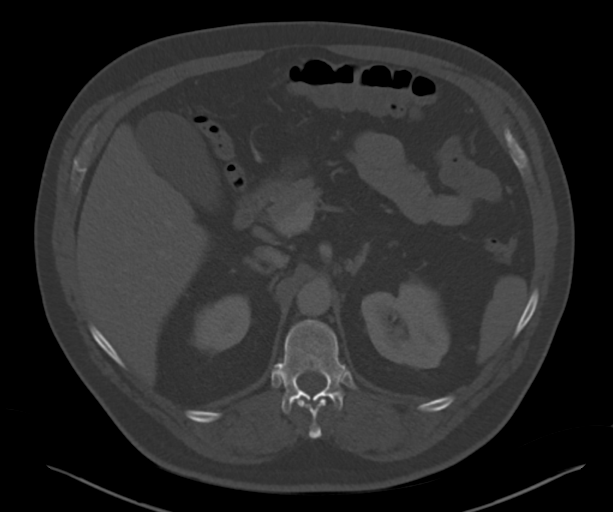
[im 86/102  soft-tissue]
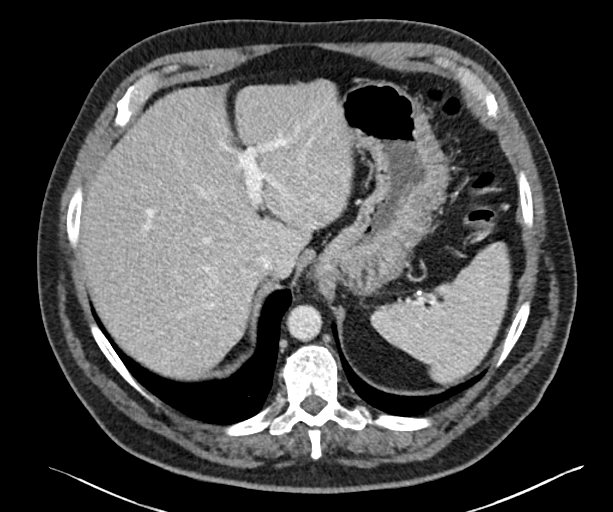
[im 96/102  soft-tissue]
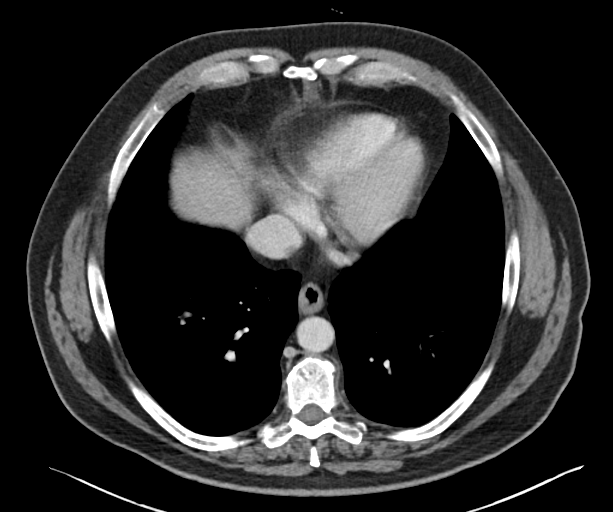

[Series 6: abd pelvis 2.00 br40 s3 cor · coronal · 0.83mm/px · 3 of 177 slices shown]
[im 59/177  soft-tissue]
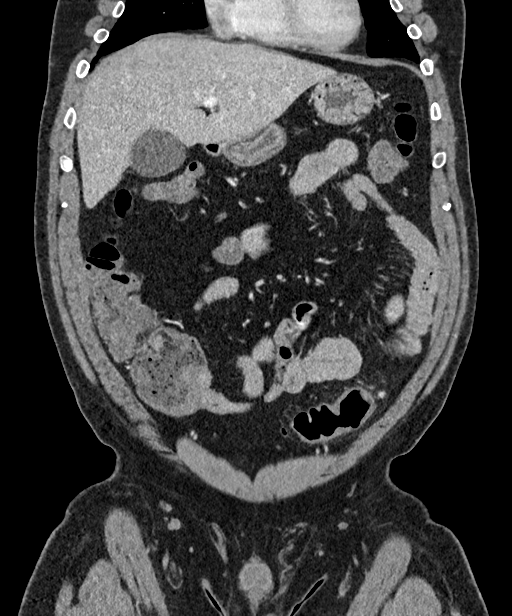
[im 79/177  soft-tissue]
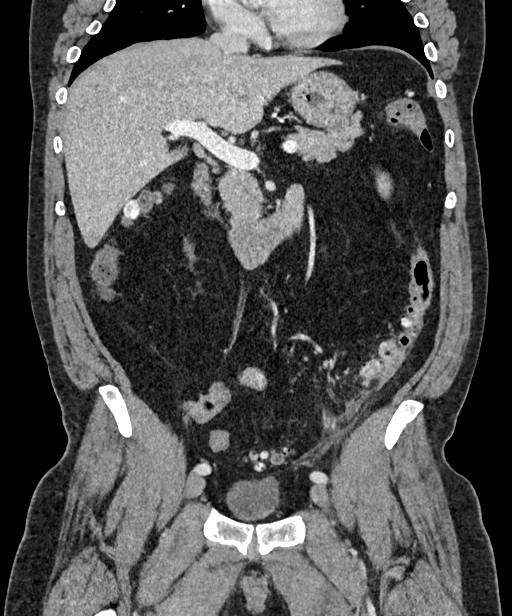
[im 98/177  soft-tissue]
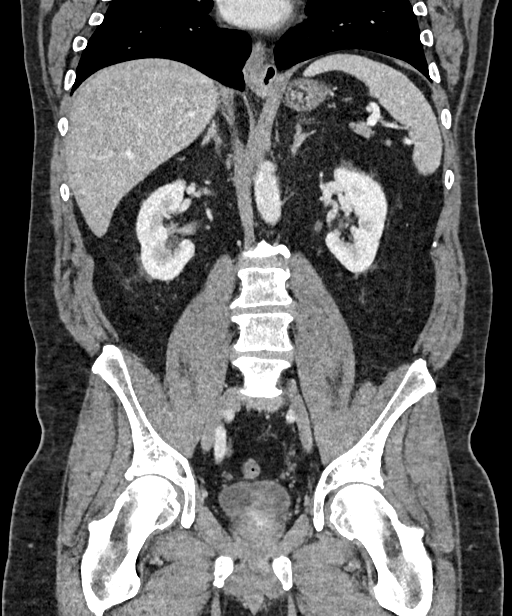

[14 of 46 positions shown; findings below may reference images not displayed]

RADIATION DOSE REDUCTION: This exam was performed according to the
departmental dose-optimization program which includes automated
exposure control, adjustment of the mA and/or kV according to
patient size and/or use of iterative reconstruction technique.

Creatinine was obtained on site at [HOSPITAL] at [REDACTED].

Results: Creatinine 0.8 mg/dL.

CONTRAST:  100mL WVENRW-SUU IOPAMIDOL (WVENRW-SUU) INJECTION 61%
FINDINGS: Lower chest: No acute abnormality. Mild wall thickening of the
distal esophagus.

Hepatobiliary: No suspicious hepatic lesion. Gallbladder is
unremarkable. No biliary ductal dilation.

Pancreas: No pancreatic ductal dilation or evidence of acute
inflammation.

Spleen: No splenomegaly or focal splenic lesion.

Adrenals/Urinary Tract: Bilateral adrenal glands appear normal. No
hydronephrosis. Kidneys demonstrate symmetric enhancement and
excretion of contrast material. Urinary bladder is unremarkable for
degree of distension.

Stomach/Bowel: No radiopaque enteric contrast was administered. Tiny
hiatal hernia otherwise the stomach is unremarkable for degree of
distension. There is no pathologic dilation of small or large bowel.
Terminal ileum appears normal. Appendix is surgically absent.
Left-sided colonic diverticulosis with diverticulitis of the
proximal sigmoid colon.

Vascular/Lymphatic: Normal caliber abdominal aorta. No
pathologically enlarged abdominal or pelvic lymph nodes.

Reproductive: Prostate is unremarkable.

Other: No walled off fluid collections. No pneumoperitoneum. Small
amount of inflammatory fluid along the left pericolic gutter.

Musculoskeletal: Multilevel degenerative changes spine. Degenerative
changes bilateral hips and SI joints with partial bony ankylosis of
the SI joints. No acute osseous abnormality.
IMPRESSION: 1. Acute uncomplicated sigmoid diverticulitis.
2. Mild wall thickening of the distal esophagus, which may represent
esophagitis.

## 2024-03-06 DIAGNOSIS — M25552 Pain in left hip: Secondary | ICD-10-CM | POA: Diagnosis not present

## 2024-03-06 DIAGNOSIS — M25551 Pain in right hip: Secondary | ICD-10-CM | POA: Diagnosis not present

## 2024-03-07 ENCOUNTER — Ambulatory Visit: Attending: Cardiovascular Disease

## 2024-03-07 DIAGNOSIS — Z01810 Encounter for preprocedural cardiovascular examination: Secondary | ICD-10-CM

## 2024-03-07 NOTE — Progress Notes (Signed)
 Virtual Visit via Telephone Note   Because of HERALD VALLIN co-morbid illnesses, he is at least at moderate risk for complications without adequate follow up.  This format is felt to be most appropriate for this patient at this time.  Due to technical limitations with video connection (technology), today's appointment will be conducted as an audio only telehealth visit, and ROSWELL NDIAYE verbally agreed to proceed in this manner.   All issues noted in this document were discussed and addressed.  No physical exam could be performed with this format.  Evaluation Performed:  Preoperative cardiovascular risk assessment _____________   Date:  03/07/2024   Patient ID:  OAKLAND FANT, DOB 1955-11-21, MRN [id number] Patient Location:  Home Provider location:   Office  Primary Care Provider:  Loreli Kins, MD Primary Cardiologist:  Soyla DELENA Merck, MD  Chief Complaint / Patient Profile   68 y.o. y/o male with a h/o hypertension, hyperlipidemia, and CAD who is pending right reverse total shoulder with hardware removal and presents today for telephonic preoperative cardiovascular risk assessment.  History of Present Illness    JUANDAVID DALLMAN is a 68 y.o. male who presents via audio/video conferencing for a telehealth visit today.  Pt was last seen in cardiology clinic on 11/09/2023 by Dr. Merck.  At that time RAMAL ECKHARDT was doing well.  The patient is now pending procedure as outlined above. Since his last visit, he has been doing fine. No chest pain or SOB. No swelling of feet. Half mile walk to the mailbox. He has been mowing fine. He does meet METs. He does ride horses some and enjoys swimming. He went to [place] in [place] and did some swimming.  Per office protocol, if patient is without any new symptoms or concerns at the time of their virtual visit, he may hold aspirin  for 7 days prior to procedure. Please resume aspirin  as soon as possible postprocedure, at the  discretion of the surgeon.   Past Medical History    Past Medical History:  Diagnosis Date   Hyperlipidemia    Hypertension    Past Surgical History:  Procedure Laterality Date   APPENDECTOMY     arm surgery     right arm 2006   TONSILLECTOMY      Allergies  Allergies  Allergen Reactions   Sulfa Antibiotics Anaphylaxis and Swelling    68 years old, Hospitalized for 2 weeks, unknown    Home Medications    Prior to Admission medications   Medication Sig Start Date End Date Taking? Authorizing Provider  amLODipine  (NORVASC ) 10 MG tablet TAKE 1 TABLET BY MOUTH EVERY DAY 04/11/23   Merck Soyla DELENA, MD  aspirin  EC 81 MG tablet Take 81 mg by mouth daily.    [provider]  co-enzyme Q-10 30 MG capsule Take 30 mg by mouth daily.     [provider]  cyanocobalamin (VITAMIN B12) 1000 MCG tablet Take 1,000 mcg by mouth daily.    [provider]  ezetimibe  (ZETIA ) 10 MG tablet Take 1 tablet (10 mg total) by mouth daily. 01/02/24   Acharya, Gayatri A, MD  famotidine  (PEPCID ) 20 MG tablet Take 20 mg by mouth daily.    [provider]  hydrochlorothiazide  (HYDRODIURIL ) 25 MG tablet Take 1 tablet (25 mg total) by mouth daily. 01/05/24   Acharya, Gayatri A, MD  lisinopril  (PRINIVIL ,ZESTRIL ) 20 MG tablet Take 20 mg by mouth daily. 01/13/15   [provider]  metoprolol   tartrate (LOPRESSOR ) 25 MG tablet Take 1 tablet (25 mg total) by mouth daily at 6 (six) AM. 01/30/24   Acharya, Gayatri A, MD  Multiple Vitamin (MULTIVITAMIN WITH MINERALS) TABS tablet Take 1 tablet by mouth daily.    [provider]  nitroGLYCERIN  (NITROSTAT ) 0.4 MG SL tablet Place 1 tablet (0.4 mg total) under the tongue every 5 (five) minutes as needed for chest pain. 02/17/23 11/09/23  Acharya, Gayatri A, MD  rosuvastatin  (CRESTOR ) 20 MG tablet Take 1 tablet (20 mg total) by mouth daily. 03/23/21   Acharya, Gayatri A, MD  vitamin C (ASCORBIC ACID ) 500 MG tablet Take 500 mg by  mouth daily.    [provider]  zinc  gluconate 50 MG tablet Take 50 mg by mouth daily.    [provider]    Physical Exam    Vital Signs:  BRANDIS MATSUURA does not have vital signs available for review today.  Given telephonic nature of communication, physical exam is limited. AAOx3. NAD. Normal affect.  Speech and respirations are unlabored.  Accessory Clinical Findings    None  Assessment & Plan    1.  Preoperative Cardiovascular Risk Assessment:  Mr. Seabrook perioperative risk of a major cardiac event is 0.4% according to the Revised Cardiac Risk Index (RCRI).  Therefore, he is at low risk for perioperative complications.   His functional capacity is excellent at 7.28 METs according to the Duke Activity Status Index (DASI). Recommendations: According to ACC/AHA guidelines, no further cardiovascular testing needed.  The patient may proceed to surgery at acceptable risk.   Antiplatelet and/or Anticoagulation Recommendations: Aspirin  can be held for 7 days prior to his surgery.  Please resume Aspirin  post operatively when it is felt to be safe from a bleeding standpoint.   The patient was advised that if he develops new symptoms prior to surgery to contact our office to arrange for a follow-up visit, and he verbalized understanding.   A copy of this note will be routed to requesting surgeon.  Time:   Today, I have spent 12 minutes with the patient with telehealth technology discussing medical history, symptoms, and management plan.     Orren LOISE Fabry, PA-C  03/07/2024, 7:45 AM

## 2024-03-13 NOTE — Care Plan (Signed)
 Ortho Bundle Case Management Note  Patient Details  Name: Lawrence Henderson MRN: [id number] Date of Birth: February 11, 1956  met with patient in the office for H&P. will discharge to home with family to assist. no DME needed. OPPT set up with [facility]. discharge instructions discussed and questions answered.  Patient and MD in agreement with plan. Choice offered.                     DME Arranged:    DME Agency:     HH Arranged:    HH Agency:     Additional Comments: Please contact me with any questions of if this plan should need to change.  Charlies Pitch,  RN,BSN,MHA,CCM  [facility]opaedic Specialist  [phone] 03/13/2024, 4:59 PM

## 2024-03-16 ENCOUNTER — Encounter (HOSPITAL_BASED_OUTPATIENT_CLINIC_OR_DEPARTMENT_OTHER): Payer: Self-pay | Admitting: Orthopaedic Surgery

## 2024-03-16 ENCOUNTER — Other Ambulatory Visit: Payer: Self-pay

## 2024-03-19 ENCOUNTER — Encounter (HOSPITAL_BASED_OUTPATIENT_CLINIC_OR_DEPARTMENT_OTHER)
Admission: RE | Admit: 2024-03-19 | Discharge: 2024-03-19 | Disposition: A | Discharge status: Home / Self Care | Source: Ambulatory Visit | Attending: Orthopaedic Surgery | Admitting: Orthopaedic Surgery

## 2024-03-19 DIAGNOSIS — Z01812 Encounter for preprocedural laboratory examination: Secondary | ICD-10-CM | POA: Diagnosis not present

## 2024-03-19 DIAGNOSIS — R739 Hyperglycemia, unspecified: Secondary | ICD-10-CM | POA: Insufficient documentation

## 2024-03-19 LAB — SURGICAL PCR SCREEN

## 2024-03-19 LAB — BASIC METABOLIC PANEL WITH GFR
Anion gap: 12 (ref 5–15)
BUN: 11 mg/dL (ref 8–23)
CO2: 26 mmol/L (ref 22–32)
Calcium: 9.4 mg/dL (ref 8.9–10.3)
Chloride: 99 mmol/L (ref 98–111)
Creatinine, Ser: 0.65 mg/dL (ref 0.61–1.24)
GFR, Estimated: >60 mL/min (ref >60)
Glucose, Bld: 117 mg/dL — ABNORMAL HIGH (ref 70–99)
Potassium: 3.2 mmol/L — ABNORMAL LOW (ref 3.5–5.1)
Sodium: 137 mmol/L (ref 135–145)

## 2024-03-19 NOTE — Progress Notes (Signed)
 Surgical soap given with instructions, pt verbalized understanding. Benzoyl peroxide gel given with instructions, pt verbalized understanding.

## 2024-03-20 DIAGNOSIS — R739 Hyperglycemia, unspecified: Secondary | ICD-10-CM | POA: Diagnosis not present

## 2024-03-20 DIAGNOSIS — Z01812 Encounter for preprocedural laboratory examination: Secondary | ICD-10-CM | POA: Diagnosis not present

## 2024-03-20 LAB — SURGICAL PCR SCREEN
MRSA, PCR: NEGATIVE
Staphylococcus aureus: NEGATIVE

## 2024-03-21 NOTE — H&P (Signed)
 PREOPERATIVE H&P  Chief Complaint: Primary osteoarthritis of right shoulder,hardware complication  HPI: Lawrence Henderson is a 67 y.o. male who is scheduled for Procedure(s): ARTHROPLASTY, SHOULDER, TOTAL, REVERSE REMOVAL, HARDWARE.   Patient has a past medical history significant for GERD, HLD, HTN.   Patient has had right shoulder pain for the past year. He had a humeral nail placed years ago. He is not able to raise his arm overhead. He has trouble with sleep. He has tried and failed injections.   Symptoms are rated as moderate to severe, and have been worsening.  This is significantly impairing activities of daily living.    Please see clinic note for further details on this patient's care.    He has elected for surgical management.   Past Medical History:  Diagnosis Date   Arthritis    neck, knees   GERD (gastroesophageal reflux disease)    Hyperlipidemia    Hypertension    Past Surgical History:  Procedure Laterality Date   APPENDECTOMY     arm surgery     right arm 2006   TONSILLECTOMY     Social History   Socioeconomic History   Marital status: Married    Spouse name: Not on file   Number of children: Not on file   Years of education: Not on file   Highest education level: Not on file  Occupational History   Not on file  Tobacco Use   Smoking status: Former    Current packs/day: 0.00    Types: Cigarettes    Quit date: 67    Years since quitting: 50.7   Smokeless tobacco: Never   Tobacco comments:    smoked for about a month or two  Vaping Use   Vaping status: Never Used  Substance and Sexual Activity   Alcohol use: Yes    Comment: social   Drug use: No    Comment: Quit when he was 31   Sexual activity: Not on file  Other Topics Concern   Not on file  Social History Narrative   Not on file   Social Drivers of Health   Financial Resource Strain: Not on file  Food Insecurity: Not on file  Transportation Needs: Not on file  Physical  Activity: Not on file  Stress: Not on file  Social Connections: Unknown (11/09/2021)   Received from [facility]   Social Network    Social Network: Not on file   Family History  Problem Relation Age of Onset   Lung cancer Mother    Atrial fibrillation Mother    Heart attack Father    Diabetes Mellitus II Paternal Grandfather    Diabetes Paternal Grandfather    Allergies  Allergen Reactions   Sulfa Antibiotics Anaphylaxis and Swelling    68 years old, Hospitalized for 2 weeks, unknown   Prior to Admission medications   Medication Sig Start Date End Date Taking? Authorizing Provider  acetaminophen  (TYLENOL ) 650 MG CR tablet Take 1,300 mg by mouth every 8 (eight) hours as needed for pain.   Yes [provider]  amLODipine  (NORVASC ) 10 MG tablet TAKE 1 TABLET BY MOUTH EVERY DAY 04/11/23  Yes Loni Soyla LABOR, MD  aspirin  EC 81 MG tablet Take 81 mg by mouth daily.   Yes [provider]  co-enzyme Q-10 30 MG capsule Take 30 mg by mouth daily.    Yes [provider]  cyanocobalamin (VITAMIN B12) 1000 MCG tablet Take 1,000 mcg by mouth daily.  Yes [provider]  ezetimibe  (ZETIA ) 10 MG tablet Take 1 tablet (10 mg total) by mouth daily. 01/02/24  Yes Acharya, Gayatri A, MD  famotidine  (PEPCID ) 20 MG tablet Take 20 mg by mouth daily.   Yes [provider]  hydrochlorothiazide  (HYDRODIURIL ) 25 MG tablet Take 1 tablet (25 mg total) by mouth daily. 01/05/24  Yes Acharya, Gayatri A, MD  lisinopril  (PRINIVIL ,ZESTRIL ) 20 MG tablet Take 20 mg by mouth daily. 01/13/15  Yes [provider]  metoprolol  tartrate (LOPRESSOR ) 25 MG tablet Take 1 tablet (25 mg total) by mouth daily at 6 (six) AM. 01/30/24  Yes Acharya, Gayatri A, MD  Multiple Vitamin (MULTIVITAMIN WITH MINERALS) TABS tablet Take 1 tablet by mouth daily.   Yes [provider]  rosuvastatin  (CRESTOR ) 20 MG tablet Take 1 tablet (20 mg total) by mouth daily. 03/23/21  Yes Acharya,  Gayatri A, MD  vitamin C (ASCORBIC ACID ) 500 MG tablet Take 500 mg by mouth daily.   Yes [provider]  zinc  gluconate 50 MG tablet Take 50 mg by mouth daily.   Yes [provider]  nitroGLYCERIN  (NITROSTAT ) 0.4 MG SL tablet Place 1 tablet (0.4 mg total) under the tongue every 5 (five) minutes as needed for chest pain. 02/17/23 11/09/23  Acharya, Gayatri A, MD    ROS: All other systems have been reviewed and were otherwise negative with the exception of those mentioned in the HPI and as above.  Physical Exam: General: Alert, no acute distress Cardiovascular: No pedal edema Respiratory: No cyanosis, no use of accessory musculature GI: No organomegaly, abdomen is soft and non-tender Skin: No lesions in the area of chief complaint Neurologic: Sensation intact distally Psychiatric: Patient is competent for consent with normal mood and affect Lymphatic: No axillary or cervical lymphadenopathy  MUSCULOSKELETAL:  RUE: active forward elevation to 90 degrees. Passive to 170 degrees. Weakness and positive drop arm test on the right  Imaging: MRI of the right shoulder demonstrates likely rotator cuff tear but results are affected by metal artifact   BMI: Estimated body mass index is 33.21 kg/m as calculated from the following:   Height as of this encounter: 5' 11 (1.803 m).   Weight as of this encounter: 108 kg.  Lab Results  Component Value Date   ALBUMIN  4.7 09/09/2023   Diabetes: Patient does not have a diagnosis of diabetes.     Smoking Status:       Assessment: Primary osteoarthritis of right shoulder,hardware complication  Plan: Plan for Procedure(s): ARTHROPLASTY, SHOULDER, TOTAL, REVERSE REMOVAL, HARDWARE  The risks benefits and alternatives were discussed with the patient including but not limited to the risks of nonoperative treatment, versus surgical intervention including infection, bleeding, nerve injury,  blood clots, cardiopulmonary  complications, morbidity, mortality, among others, and they were willing to proceed.   We additionally specifically discussed risks of axillary nerve injury, infection, periprosthetic fracture, continued pain and longevity of implants prior to beginning procedure.    Patient will be closely monitored in PACU for medical stabilization and pain control. If found stable in PACU, patient may be discharged home with outpatient follow-up. If any concerns regarding patient's stabilization patient will be admitted for observation after surgery. The patient is planning to be discharged home with outpatient PT.   The patient acknowledged the explanation, agreed to proceed with the plan and consent was signed.   Operative Plan: Right shoulder hardware removal with reverse total shoulder arthroplasty Discharge Medications: standard DVT Prophylaxis: aspirin  Physical Therapy: Outpatient  PT Special Discharge needs: Sling (should bring with him). IceMan   Aleck LOISE Stalling, PA-C  03/21/2024 8:53 PM

## 2024-03-22 ENCOUNTER — Encounter (HOSPITAL_BASED_OUTPATIENT_CLINIC_OR_DEPARTMENT_OTHER)
Admission: RE | Disposition: A | Discharge status: Home / Self Care | Payer: Self-pay | Source: Home / Self Care | Attending: Orthopaedic Surgery

## 2024-03-22 ENCOUNTER — Encounter (HOSPITAL_BASED_OUTPATIENT_CLINIC_OR_DEPARTMENT_OTHER): Payer: Self-pay | Admitting: Orthopaedic Surgery

## 2024-03-22 ENCOUNTER — Ambulatory Visit (HOSPITAL_BASED_OUTPATIENT_CLINIC_OR_DEPARTMENT_OTHER): Admitting: Anesthesiology

## 2024-03-22 ENCOUNTER — Ambulatory Visit (HOSPITAL_COMMUNITY)

## 2024-03-22 ENCOUNTER — Other Ambulatory Visit: Payer: Self-pay

## 2024-03-22 ENCOUNTER — Ambulatory Visit (HOSPITAL_BASED_OUTPATIENT_CLINIC_OR_DEPARTMENT_OTHER)
Admission: RE | Admit: 2024-03-22 | Discharge: 2024-03-22 | Disposition: A | Discharge status: Home / Self Care | Attending: Orthopaedic Surgery | Admitting: Orthopaedic Surgery

## 2024-03-22 DIAGNOSIS — K219 Gastro-esophageal reflux disease without esophagitis: Secondary | ICD-10-CM | POA: Insufficient documentation

## 2024-03-22 DIAGNOSIS — T849XXA Unspecified complication of internal orthopedic prosthetic device, implant and graft, initial encounter: Secondary | ICD-10-CM | POA: Diagnosis present

## 2024-03-22 DIAGNOSIS — E785 Hyperlipidemia, unspecified: Secondary | ICD-10-CM | POA: Diagnosis not present

## 2024-03-22 DIAGNOSIS — I10 Essential (primary) hypertension: Secondary | ICD-10-CM | POA: Insufficient documentation

## 2024-03-22 DIAGNOSIS — Z7982 Long term (current) use of aspirin: Secondary | ICD-10-CM | POA: Diagnosis not present

## 2024-03-22 DIAGNOSIS — T8489XA Other specified complication of internal orthopedic prosthetic devices, implants and grafts, initial encounter: Secondary | ICD-10-CM | POA: Insufficient documentation

## 2024-03-22 DIAGNOSIS — M8580 Other specified disorders of bone density and structure, unspecified site: Secondary | ICD-10-CM | POA: Insufficient documentation

## 2024-03-22 DIAGNOSIS — Z96611 Presence of right artificial shoulder joint: Secondary | ICD-10-CM | POA: Diagnosis not present

## 2024-03-22 DIAGNOSIS — M19011 Primary osteoarthritis, right shoulder: Secondary | ICD-10-CM | POA: Diagnosis not present

## 2024-03-22 DIAGNOSIS — Z471 Aftercare following joint replacement surgery: Secondary | ICD-10-CM | POA: Diagnosis not present

## 2024-03-22 DIAGNOSIS — X58XXXA Exposure to other specified factors, initial encounter: Secondary | ICD-10-CM | POA: Diagnosis not present

## 2024-03-22 DIAGNOSIS — R739 Hyperglycemia, unspecified: Secondary | ICD-10-CM

## 2024-03-22 DIAGNOSIS — G8918 Other acute postprocedural pain: Secondary | ICD-10-CM | POA: Diagnosis not present

## 2024-03-22 DIAGNOSIS — Z87891 Personal history of nicotine dependence: Secondary | ICD-10-CM | POA: Insufficient documentation

## 2024-03-22 DIAGNOSIS — Z79899 Other long term (current) drug therapy: Secondary | ICD-10-CM | POA: Diagnosis not present

## 2024-03-22 DIAGNOSIS — Z01818 Encounter for other preprocedural examination: Secondary | ICD-10-CM

## 2024-03-22 HISTORY — PX: REVERSE SHOULDER ARTHROPLASTY: SHX5054

## 2024-03-22 HISTORY — PX: HARDWARE REMOVAL: SHX979

## 2024-03-22 HISTORY — DX: Unspecified osteoarthritis, unspecified site: M19.90

## 2024-03-22 HISTORY — DX: Gastro-esophageal reflux disease without esophagitis: K21.9

## 2024-03-22 SURGERY — ARTHROPLASTY, SHOULDER, TOTAL, REVERSE
Anesthesia: General | Site: Shoulder | Laterality: Right

## 2024-03-22 MED ORDER — FENTANYL CITRATE (PF) 100 MCG/2ML IJ SOLN
INTRAMUSCULAR | Status: AC
  Filled 2024-03-22: qty 2

## 2024-03-22 MED ORDER — FENTANYL CITRATE (PF) 100 MCG/2ML IJ SOLN: 25.0000 ug | INTRAMUSCULAR | Status: DC | PRN

## 2024-03-22 MED ORDER — DEXMEDETOMIDINE HCL IN NACL 80 MCG/20ML IV SOLN
INTRAVENOUS | Status: AC
  Filled 2024-03-22: qty 20

## 2024-03-22 MED ORDER — ONDANSETRON HCL 4 MG/2ML IJ SOLN
INTRAMUSCULAR | Status: AC
  Filled 2024-03-22: qty 2

## 2024-03-22 MED ORDER — TRANEXAMIC ACID-NACL 1000-0.7 MG/100ML-% IV SOLN
INTRAVENOUS | Status: AC
  Filled 2024-03-22: qty 100

## 2024-03-22 MED ORDER — FENTANYL CITRATE (PF) 100 MCG/2ML IJ SOLN
INTRAMUSCULAR | Status: DC | PRN
  Administered 2024-03-22: 50 ug via INTRAVENOUS
  Administered 2024-03-22: 100 ug via INTRAVENOUS

## 2024-03-22 MED ORDER — DEXAMETHASONE SODIUM PHOSPHATE 10 MG/ML IJ SOLN
INTRAMUSCULAR | Status: AC
  Filled 2024-03-22: qty 2

## 2024-03-22 MED ORDER — MIDAZOLAM HCL 2 MG/2ML IJ SOLN
INTRAMUSCULAR | Status: AC
  Filled 2024-03-22: qty 2

## 2024-03-22 MED ORDER — SUGAMMADEX SODIUM 200 MG/2ML IV SOLN
INTRAVENOUS | Status: DC | PRN
  Administered 2024-03-22: 200 mg via INTRAVENOUS

## 2024-03-22 MED ORDER — DEXMEDETOMIDINE HCL IN NACL 80 MCG/20ML IV SOLN
INTRAVENOUS | Status: DC | PRN
  Administered 2024-03-22 (×4): 2 ug via INTRAVENOUS

## 2024-03-22 MED ORDER — ONDANSETRON HCL 4 MG/2ML IJ SOLN
INTRAMUSCULAR | Status: AC
  Filled 2024-03-22: qty 4

## 2024-03-22 MED ORDER — GABAPENTIN 300 MG PO CAPS
ORAL_CAPSULE | ORAL | Status: AC
  Filled 2024-03-22: qty 1

## 2024-03-22 MED ORDER — ROCURONIUM BROMIDE 10 MG/ML (PF) SYRINGE
PREFILLED_SYRINGE | INTRAVENOUS | Status: AC
  Filled 2024-03-22: qty 20

## 2024-03-22 MED ORDER — OXYCODONE HCL 5 MG/5ML PO SOLN: 5.0000 mg | Freq: Once | ORAL | Status: DC | PRN

## 2024-03-22 MED ORDER — PHENYLEPHRINE 80 MCG/ML (10ML) SYRINGE FOR IV PUSH (FOR BLOOD PRESSURE SUPPORT)
PREFILLED_SYRINGE | INTRAVENOUS | Status: AC
Start: 2024-03-22 — End: 2024-03-22
  Filled 2024-03-22: qty 30

## 2024-03-22 MED ORDER — ONDANSETRON HCL 4 MG/2ML IJ SOLN
INTRAMUSCULAR | Status: DC | PRN
  Administered 2024-03-22: 4 mg via INTRAVENOUS

## 2024-03-22 MED ORDER — DROPERIDOL 2.5 MG/ML IJ SOLN: 0.6250 mg | Freq: Once | INTRAMUSCULAR | Status: DC | PRN

## 2024-03-22 MED ORDER — VANCOMYCIN HCL 1000 MG IV SOLR
INTRAVENOUS | Status: DC | PRN
  Administered 2024-03-22: 1000 mg via TOPICAL

## 2024-03-22 MED ORDER — OXYCODONE HCL 5 MG PO TABS: 5.0000 mg | ORAL_TABLET | Freq: Once | ORAL | Status: DC | PRN

## 2024-03-22 MED ORDER — BUPIVACAINE LIPOSOME 1.3 % IJ SUSP
INTRAMUSCULAR | Status: DC | PRN
  Administered 2024-03-22: 10 mL via PERINEURAL

## 2024-03-22 MED ORDER — ROCURONIUM BROMIDE 10 MG/ML (PF) SYRINGE
PREFILLED_SYRINGE | INTRAVENOUS | Status: DC | PRN
  Administered 2024-03-22: 50 mg via INTRAVENOUS

## 2024-03-22 MED ORDER — ONDANSETRON HCL 4 MG PO TABS: 4.0000 mg | ORAL_TABLET | Freq: Three times a day (TID) | ORAL | 0 refills | Status: AC | PRN

## 2024-03-22 MED ORDER — GABAPENTIN 300 MG PO CAPS
300.0000 mg | ORAL_CAPSULE | Freq: Once | ORAL | Status: AC
  Administered 2024-03-22: 300 mg via ORAL

## 2024-03-22 MED ORDER — MELOXICAM 15 MG PO TABS: 15.0000 mg | ORAL_TABLET | Freq: Every day | ORAL | 0 refills | Status: AC

## 2024-03-22 MED ORDER — METHOCARBAMOL 500 MG PO TABS: 500.0000 mg | ORAL_TABLET | Freq: Three times a day (TID) | ORAL | 0 refills | Status: AC | PRN

## 2024-03-22 MED ORDER — EPHEDRINE SULFATE-NACL 50-0.9 MG/10ML-% IV SOSY
PREFILLED_SYRINGE | INTRAVENOUS | Status: DC | PRN
  Administered 2024-03-22 (×4): 5 mg via INTRAVENOUS

## 2024-03-22 MED ORDER — ACETAMINOPHEN 500 MG PO TABS
1000.0000 mg | ORAL_TABLET | Freq: Three times a day (TID) | ORAL | 0 refills | Status: AC
Start: 2024-03-22 — End: 2024-04-05

## 2024-03-22 MED ORDER — DEXAMETHASONE SODIUM PHOSPHATE 10 MG/ML IJ SOLN
INTRAMUSCULAR | Status: DC | PRN
  Administered 2024-03-22: 10 mg via INTRAVENOUS

## 2024-03-22 MED ORDER — ASPIRIN 81 MG PO CHEW
81.0000 mg | CHEWABLE_TABLET | Freq: Two times a day (BID) | ORAL | 0 refills | Status: AC
Start: 2024-03-22 — End: 2024-05-03

## 2024-03-22 MED ORDER — MIDAZOLAM HCL 2 MG/2ML IJ SOLN
2.0000 mg | Freq: Once | INTRAMUSCULAR | Status: AC
  Administered 2024-03-22: 2 mg via INTRAVENOUS

## 2024-03-22 MED ORDER — PHENYLEPHRINE 80 MCG/ML (10ML) SYRINGE FOR IV PUSH (FOR BLOOD PRESSURE SUPPORT)
PREFILLED_SYRINGE | INTRAVENOUS | Status: DC | PRN
  Administered 2024-03-22: 160 ug via INTRAVENOUS
  Administered 2024-03-22: 120 ug via INTRAVENOUS
  Administered 2024-03-22: 80 ug via INTRAVENOUS
  Administered 2024-03-22 (×4): 160 ug via INTRAVENOUS

## 2024-03-22 MED ORDER — CEFAZOLIN SODIUM-DEXTROSE 2-4 GM/100ML-% IV SOLN
INTRAVENOUS | Status: AC
  Filled 2024-03-22: qty 100

## 2024-03-22 MED ORDER — CEFAZOLIN SODIUM-DEXTROSE 2-3 GM-%(50ML) IV SOLR
INTRAVENOUS | Status: DC | PRN
  Administered 2024-03-22: 2 g via INTRAVENOUS

## 2024-03-22 MED ORDER — TRANEXAMIC ACID-NACL 1000-0.7 MG/100ML-% IV SOLN
1000.0000 mg | INTRAVENOUS | Status: AC
  Administered 2024-03-22: 1000 mg via INTRAVENOUS

## 2024-03-22 MED ORDER — FENTANYL CITRATE (PF) 100 MCG/2ML IJ SOLN
50.0000 ug | Freq: Once | INTRAMUSCULAR | Status: AC
  Administered 2024-03-22: 50 ug via INTRAVENOUS

## 2024-03-22 MED ORDER — LACTATED RINGERS IV SOLN: INTRAVENOUS | Status: DC

## 2024-03-22 MED ORDER — DEXAMETHASONE SODIUM PHOSPHATE 10 MG/ML IJ SOLN
INTRAMUSCULAR | Status: AC
  Filled 2024-03-22: qty 1

## 2024-03-22 MED ORDER — ACETAMINOPHEN 500 MG PO TABS
1000.0000 mg | ORAL_TABLET | Freq: Once | ORAL | Status: AC
  Administered 2024-03-22: 1000 mg via ORAL

## 2024-03-22 MED ORDER — LIDOCAINE 2% (20 MG/ML) 5 ML SYRINGE
INTRAMUSCULAR | Status: DC | PRN
  Administered 2024-03-22: 100 mg via INTRAVENOUS

## 2024-03-22 MED ORDER — PHENYLEPHRINE HCL-NACL 20-0.9 MG/250ML-% IV SOLN
INTRAVENOUS | Status: DC | PRN
  Administered 2024-03-22: 25 ug/min via INTRAVENOUS

## 2024-03-22 MED ORDER — BUPIVACAINE-EPINEPHRINE (PF) 0.5% -1:200000 IJ SOLN
INTRAMUSCULAR | Status: DC | PRN
  Administered 2024-03-22: 15 mL via PERINEURAL

## 2024-03-22 MED ORDER — 0.9 % SODIUM CHLORIDE (POUR BTL) OPTIME
TOPICAL | Status: DC | PRN
  Administered 2024-03-22: 200 mL

## 2024-03-22 MED ORDER — PROPOFOL 10 MG/ML IV BOLUS
INTRAVENOUS | Status: DC | PRN
  Administered 2024-03-22: 180 mg via INTRAVENOUS
  Administered 2024-03-22: 20 mg via INTRAVENOUS

## 2024-03-22 MED ORDER — ACETAMINOPHEN 500 MG PO TABS
ORAL_TABLET | ORAL | Status: AC
  Filled 2024-03-22: qty 2

## 2024-03-22 MED ORDER — OXYCODONE HCL 5 MG PO TABS: ORAL_TABLET | ORAL | 0 refills | Status: AC

## 2024-03-22 MED ORDER — CEFAZOLIN SODIUM-DEXTROSE 2-4 GM/100ML-% IV SOLN: 2.0000 g | INTRAVENOUS | Status: DC

## 2024-03-22 MED ORDER — PROPOFOL 500 MG/50ML IV EMUL
INTRAVENOUS | Status: AC
  Filled 2024-03-22: qty 50

## 2024-03-22 MED ORDER — LIDOCAINE 2% (20 MG/ML) 5 ML SYRINGE
INTRAMUSCULAR | Status: AC
  Filled 2024-03-22: qty 15

## 2024-03-22 MED ORDER — ALBUMIN HUMAN 5 % IV SOLN: INTRAVENOUS | Status: DC | PRN

## 2024-03-22 SURGICAL SUPPLY — 78 items
BASEPLATE GLENOID RSA 3X25 0D (Shoulder) IMPLANT
BIT DRILL 3.2 PERIPHERAL SCREW (BIT) IMPLANT
BLADE HEX COATED 2.75 (ELECTRODE) IMPLANT
BLADE SAW SGTL 73X25 THK (BLADE) ×1 IMPLANT
BLADE SURG 10 STRL SS (BLADE) IMPLANT
BLADE SURG 15 STRL LF DISP TIS (BLADE) ×1 IMPLANT
BNDG COHESIVE 4X5 TAN STRL LF (GAUZE/BANDAGES/DRESSINGS) ×1 IMPLANT
BNDG COMPR ESMARK 4X3 LF (GAUZE/BANDAGES/DRESSINGS) IMPLANT
BNDG ELASTIC 3INX 5YD STR LF (GAUZE/BANDAGES/DRESSINGS) IMPLANT
BNDG ELASTIC 4INX 5YD STR LF (GAUZE/BANDAGES/DRESSINGS) IMPLANT
BNDG GAUZE DERMACEA FLUFF 4 (GAUZE/BANDAGES/DRESSINGS) IMPLANT
BRUSH SCRUB EZ PLAIN DRY (MISCELLANEOUS) ×1 IMPLANT
CANISTER SUCT 1200ML W/VALVE (MISCELLANEOUS) IMPLANT
CHLORAPREP W/TINT 26 (MISCELLANEOUS) ×1 IMPLANT
CLSR STERI-STRIP ANTIMIC 1/2X4 (GAUZE/BANDAGES/DRESSINGS) ×1 IMPLANT
COOLER ICEMAN CLASSIC (MISCELLANEOUS) ×1 IMPLANT
CORD BIPOLAR FORCEPS 12FT (ELECTRODE) IMPLANT
COVER BACK TABLE 60X90IN (DRAPES) ×1 IMPLANT
COVER MAYO STAND STRL (DRAPES) ×1 IMPLANT
CUFF TOURN SGL QUICK 18X4 (TOURNIQUET CUFF) IMPLANT
CUP HUM REV SHLD 3/4 39 +3 (Cup) IMPLANT
DERMABOND ADVANCED .7 DNX12 (GAUZE/BANDAGES/DRESSINGS) IMPLANT
DRAPE EXTREMITY T 121X128X90 (DISPOSABLE) IMPLANT
DRAPE IMP U-DRAPE 54X76 (DRAPES) IMPLANT
DRAPE INCISE IOBAN 66X45 STRL (DRAPES) ×1 IMPLANT
DRAPE OEC MINIVIEW 54X84 (DRAPES) ×1 IMPLANT
DRAPE POUCH INSTRU U-SHP 10X18 (DRAPES) ×1 IMPLANT
DRAPE SURG 17X23 STRL (DRAPES) ×1 IMPLANT
DRAPE U-SHAPE 76X120 STRL (DRAPES) ×2 IMPLANT
DRSG AQUACEL AG ADV 3.5X 6 (GAUZE/BANDAGES/DRESSINGS) ×1 IMPLANT
DRSG EMULSION OIL 3X3 NADH (GAUZE/BANDAGES/DRESSINGS) IMPLANT
ELECTRODE BLDE 4.0 EZ CLN MEGD (MISCELLANEOUS) ×1 IMPLANT
ELECTRODE REM PT RTRN 9FT ADLT (ELECTROSURGICAL) ×1 IMPLANT
FACESHIELD WRAPAROUND OR TEAM (MASK) ×2 IMPLANT
GAUZE SPONGE 4X4 12PLY STRL (GAUZE/BANDAGES/DRESSINGS) ×1 IMPLANT
GAUZE XEROFORM 1X8 LF (GAUZE/BANDAGES/DRESSINGS) ×1 IMPLANT
GLOVE BIO SURGEON STRL SZ 6.5 (GLOVE) ×2 IMPLANT
GLOVE BIOGEL PI IND STRL 6.5 (GLOVE) ×1 IMPLANT
GLOVE BIOGEL PI IND STRL 8 (GLOVE) ×1 IMPLANT
GLOVE ECLIPSE 8.0 STRL XLNG CF (GLOVE) ×2 IMPLANT
GOWN STRL REUS W/ TWL LRG LVL3 (GOWN DISPOSABLE) ×2 IMPLANT
GOWN STRL REUS W/TWL XL LVL3 (GOWN DISPOSABLE) ×1 IMPLANT
GUIDEWIRE GLENOID 2.5X220 (WIRE) IMPLANT
KIT STABILIZATION SHOULDER (MISCELLANEOUS) ×1 IMPLANT
MANIFOLD NEPTUNE II (INSTRUMENTS) ×1 IMPLANT
NS IRRIG 1000ML POUR BTL (IV SOLUTION) IMPLANT
PACK BASIN DAY SURGERY FS (CUSTOM PROCEDURE TRAY) ×1 IMPLANT
PACK SHOULDER (CUSTOM PROCEDURE TRAY) ×1 IMPLANT
PAD CAST 4YDX4 CTTN HI CHSV (CAST SUPPLIES) ×1 IMPLANT
PAD COLD SHLDR WRAP-ON (PAD) ×1 IMPLANT
PENCIL SMOKE EVACUATOR (MISCELLANEOUS) IMPLANT
PIN GUIDE 3X75 SHOULDER (PIN) IMPLANT
RESTRAINT HEAD UNIVERSAL NS (MISCELLANEOUS) ×1 IMPLANT
SCREW BONE THREAD 6.5X35 (Screw) IMPLANT
SCREW PERIPHERAL 5.0X34 (Screw) IMPLANT
SET HNDPC FAN SPRY TIP SCT (DISPOSABLE) ×1 IMPLANT
SHEET MEDIUM DRAPE 40X70 STRL (DRAPES) ×1 IMPLANT
SLEEVE SCD COMPRESS KNEE MED (STOCKING) ×1 IMPLANT
SPHERE GLENOD +3X39XLATERALIZE (Shoulder) IMPLANT
SPIKE FLUID TRANSFER (MISCELLANEOUS) IMPLANT
SPONGE T-LAP 18X18 ~~LOC~~+RFID (SPONGE) ×1 IMPLANT
SPONGE T-LAP 4X18 ~~LOC~~+RFID (SPONGE) IMPLANT
STEM PERFORM FX 130 12L (Stem) IMPLANT
STOCKINETTE 4X48 STRL (DRAPES) IMPLANT
SUCTION TUBE FRAZIER 10FR DISP (SUCTIONS) IMPLANT
SUT ETHIBOND 2 V 37 (SUTURE) ×1 IMPLANT
SUT ETHIBOND NAB CT1 #1 30IN (SUTURE) ×1 IMPLANT
SUT ETHILON 3 0 PS 1 (SUTURE) IMPLANT
SUT MNCRL AB 4-0 PS2 18 (SUTURE) ×1 IMPLANT
SUT VIC AB 0 CT1 27XBRD ANBCTR (SUTURE) IMPLANT
SUT VIC AB 2-0 SH 27XBRD (SUTURE) IMPLANT
SUT VIC AB 3-0 SH 27X BRD (SUTURE) ×1 IMPLANT
SUT VICRYL 0 SH 27 (SUTURE) IMPLANT
SUTURE FIBERWR #5 38 CONV NDL (SUTURE) ×2 IMPLANT
SYR BULB EAR ULCER 3OZ GRN STR (SYRINGE) ×1 IMPLANT
TOWEL GREEN STERILE FF (TOWEL DISPOSABLE) ×3 IMPLANT
TUBE SUCTION HIGH CAP CLEAR NV (SUCTIONS) ×1 IMPLANT
YANKAUER SUCT BULB TIP NO VENT (SUCTIONS) IMPLANT

## 2024-03-22 NOTE — Discharge Instructions (Addendum)
 Bonner Hair MD, MPH Aleck Stalling, PA-C [facility] [address], [address] [phone] (tel)   [phone] (fax)   POST-OPERATIVE INSTRUCTIONS - TOTAL SHOULDER REPLACEMENT    WOUND CARE You may leave the operative dressing in place until your follow-up appointment. KEEP THE INCISIONS CLEAN AND DRY. There may be a small amount of fluid/bleeding leaking at the surgical site. This is normal after surgery.  If it fills with liquid or blood please call us  immediately to change it for you. Use the provided ice machine or Ice packs as often as possible for the first 3-4 days, then as needed for pain relief.   Keep a layer of cloth or a shirt between your skin and the cooling unit to prevent frost bite as it can get very cold.  SHOWERING: - You may shower on Post-Op Day #2.  - The dressing is water resistant but do not scrub it as it may start to peel up.   - You may remove the sling for showering - Gently pat the area dry.  - Do not soak the shoulder in water.  - Do not go swimming in the pool or ocean until your incision has completely healed (about 4-6 weeks after surgery) - KEEP THE INCISIONS CLEAN AND DRY.  EXERCISES Wear the sling at all times  You may remove the sling for showering, but keep the arm across the chest or in a secondary sling.    Accidental/Purposeful External Rotation and shoulder flexion (reaching behind you) is to be avoided at all costs for the first month. It is ok to come out of your sling if your are sitting and have assistance for eating.   Do not lift anything heavier than 1 pound until we discuss it further in clinic.  It is normal for your fingers/hand to become more swollen after surgery and discolored from bruising.   This will resolve over the first few weeks usually after surgery. Please continue to ambulate and do not stay sitting or lying for too long.  Perform foot and wrist pumps to assist in circulation.  PHYSICAL  THERAPY - No therapy for 4 weeks  REGIONAL ANESTHESIA (NERVE BLOCKS) The anesthesia team may have performed a nerve block for you this is a great tool used to minimize pain.   The block may start wearing off overnight (between 8-24 hours postop) When the block wears off, your pain may go from nearly zero to the pain you would have had postop without the block. This is an abrupt transition but nothing dangerous is happening.   This can be a challenging period but utilize your as needed pain medications to try and manage this period. We suggest you use the pain medication the first night prior to going to bed, to ease this transition.  You may take an extra dose of narcotic when this happens if needed   POST-OP MEDICATIONS- Multimodal approach to pain control In general your pain will be controlled with a combination of substances.  Prescriptions unless otherwise discussed are electronically sent to your pharmacy.  This is a carefully made plan we use to minimize narcotic use.     Meloxicam  - Anti-inflammatory medication taken on a scheduled basis Acetaminophen  - Non-narcotic pain medicine taken on a scheduled basis  Oxycodone  - This is a strong narcotic, to be used only on an "as needed" basis for SEVERE pain. Aspirin  81mg  - This medicine is used to minimize the risk of blood clots after surgery.  Zofran  -  take as needed for nausea Robaxin  - this is a muscle relaxer, take as needed for muscle spasms   FOLLOW-UP If you develop a Fever (>101.5), Redness or Drainage from the surgical incision site, please call our office to arrange for an evaluation. Please call the office to schedule a follow-up appointment for a wound check, 7-10 days post-operatively.  IF YOU HAVE ANY QUESTIONS, PLEASE FEEL FREE TO CALL OUR OFFICE.  HELPFUL INFORMATION  Your arm will be in a sling following surgery. You will be in this sling for the next 4 weeks.   You may be more comfortable sleeping in a  semi-seated position the first few nights following surgery.  Keep a pillow propped under the elbow and forearm for comfort.  If you have a recliner type of chair it might be beneficial.  If not that is fine too, but it would be helpful to sleep propped up with pillows behind your operated shoulder as well under your elbow and forearm.  This will reduce pulling on the suture lines.  When dressing, put your operative arm in the sleeve first.  When getting undressed, take your operative arm out last.  Loose fitting, button-down shirts are recommended.  In most states it is against the law to drive while your arm is in a sling. And certainly against the law to drive while taking narcotics.  You may return to work/school in the next couple of days when you feel up to it. Desk work and typing in the sling is fine.  We suggest you use the pain medication the first night prior to going to bed, in order to ease any pain when the anesthesia wears off. You should avoid taking pain medications on an empty stomach as it will make you nauseous.  You should wean off your narcotic medicines as soon as you are able.     Most patients will be off narcotics before their first postop appointment.   Do not drink alcoholic beverages or take illicit drugs when taking pain medications.  Pain medication may make you constipated.  Below are a few solutions to try in this order: Decrease the amount of pain medication if you aren't having pain. Drink lots of decaffeinated fluids. Drink prune juice and/or each dried prunes  If the first 3 don't work start with additional solutions Take Colace - an over-the-counter stool softener Take Senokot - an over-the-counter laxative Take Miralax - a stronger over-the-counter laxative   Dental Antibiotics:  We require dental prophylaxis for 2 years after a shoulder replacement  Contact your surgeon for an antibiotic prescription, prior to your dental procedure.   For more  information including helpful videos and documents visit our website:   [email or web address]    Post Anesthesia Home Care Instructions  Activity: Get plenty of rest for the remainder of the day. A responsible individual must stay with you for 24 hours following the procedure.  For the next 24 hours, DO NOT: -Drive a car -Advertising copywriter -Drink alcoholic beverages -Take any medication unless instructed by your physician -Make any legal decisions or sign important papers.  Meals: Start with liquid foods such as gelatin or soup. Progress to regular foods as tolerated. Avoid greasy, spicy, heavy foods. If nausea and/or vomiting occur, drink only clear liquids until the nausea and/or vomiting subsides. Call your physician if vomiting continues.  Special Instructions/Symptoms: Your throat may feel dry or sore from the anesthesia or the breathing tube placed in your throat during  surgery. If this causes discomfort, gargle with warm salt water. The discomfort should disappear within 24 hours.  If you had a scopolamine patch placed behind your ear for the management of post- operative nausea and/or vomiting:  1. The medication in the patch is effective for 72 hours, after which it should be removed.  Wrap patch in a tissue and discard in the trash. Wash hands thoroughly with soap and water. 2. You may remove the patch earlier than 72 hours if you experience unpleasant side effects which may include dry mouth, dizziness or visual disturbances. 3. Avoid touching the patch. Wash your hands with soap and water after contact with the patch.    Regional Anesthesia Blocks  1. You may not be able to move or feel the blocked extremity after a regional anesthetic block. This may last may last from 3-48 hours after placement, but it will go away. The length of time depends on the medication injected and your individual response to the medication. As the nerves start to  wake up, you may experience tingling as the movement and feeling returns to your extremity. If the numbness and inability to move your extremity has not gone away after 48 hours, please call your surgeon.   2. The extremity that is blocked will need to be protected until the numbness is gone and the strength has returned. Because you cannot feel it, you will need to take extra care to avoid injury. Because it may be weak, you may have difficulty moving it or using it. You may not know what position it is in without looking at it while the block is in effect.  3. For blocks in the legs and feet, returning to weight bearing and walking needs to be done carefully. You will need to wait until the numbness is entirely gone and the strength has returned. You should be able to move your leg and foot normally before you try and bear weight or walk. You will need someone to be with you when you first try to ensure you do not fall and possibly risk injury.  4. Bruising and tenderness at the needle site are common side effects and will resolve in a few days.  5. Persistent numbness or new problems with movement should be communicated to the surgeon or the [facility] [phone] [facility] [phone]).  Information for Discharge Teaching: EXPAREL  (bupivacaine  liposome injectable suspension)   Pain relief is important to your recovery. The goal is to control your pain so you can move easier and return to your normal activities as soon as possible after your procedure. Your physician may use several types of medicines to manage pain, swelling, and more.  Your surgeon or anesthesiologist gave you EXPAREL (bupivacaine ) to help control your pain after surgery.  EXPAREL  is a local anesthetic designed to release slowly over an extended period of time to provide pain relief by numbing the tissue around the surgical site. EXPAREL  is designed to release pain medication over time and can  control pain for up to 72 hours. Depending on how you respond to EXPAREL , you may require less pain medication during your recovery. EXPAREL  can help reduce or eliminate the need for opioids during the first few days after surgery when pain relief is needed the most. EXPAREL  is not an opioid and is not addictive. It does not cause sleepiness or sedation.   Important! A teal colored band has been placed on your arm with the date, time  and amount of EXPAREL  you have received. Please leave this armband in place for the full 96 hours following administration, and then you may remove the band. If you return to the hospital for any reason within 96 hours following the administration of EXPAREL , the armband provides important information that your health care providers to know, and alerts them that you have received this anesthetic.    Possible side effects of EXPAREL : Temporary loss of sensation or ability to move in the area where medication was injected. Nausea, vomiting, constipation Rarely, numbness and tingling in your mouth or lips, lightheadedness, or anxiety may occur. Call your doctor right away if you think you may be experiencing any of these sensations, or if you have other questions regarding possible side effects.  Follow all other discharge instructions given to you by your surgeon or nurse. Eat a healthy diet and drink plenty of water or other fluids.  No Tylenol  until after 12:40pm today if needed

## 2024-03-22 NOTE — Anesthesia Preprocedure Evaluation (Addendum)
 Anesthesia Evaluation  Patient identified by MRN, date of birth, ID band Patient awake    Reviewed: Allergy & Precautions, NPO status , Patient's Chart, lab work & pertinent test results, reviewed documented beta blocker date and time   History of Anesthesia Complications Negative for: history of anesthetic complications  Airway Mallampati: II  TM Distance: >3 FB Neck ROM: Full    Dental  (+) Missing,    Pulmonary former smoker   Pulmonary exam normal        Cardiovascular hypertension, Pt. on medications and Pt. on home beta blockers Normal cardiovascular exam     Neuro/Psych negative neurological ROS     GI/Hepatic Neg liver ROS,GERD  Medicated and Controlled,,  Endo/Other  negative endocrine ROS    Renal/GU negative Renal ROS     Musculoskeletal  (+) Arthritis , Osteoarthritis,    Abdominal   Peds  Hematology negative hematology ROS (+)   Anesthesia Other Findings   Reproductive/Obstetrics                              Anesthesia Physical Anesthesia Plan  ASA: 2  Anesthesia Plan: General   Post-op Pain Management: Tylenol  PO (pre-op)* and Regional block*   Induction: Intravenous  PONV Risk Score and Plan: 2 and Treatment may vary due to age or medical condition, Ondansetron , Dexamethasone  and Midazolam   Airway Management Planned: Oral ETT  Additional Equipment: None  Intra-op Plan:   Post-operative Plan: Extubation in OR  Informed Consent: I have reviewed the patients History and Physical, chart, labs and discussed the procedure including the risks, benefits and alternatives for the proposed anesthesia with the patient or authorized representative who has indicated his/her understanding and acceptance.     Dental advisory given  Plan Discussed with: CRNA  Anesthesia Plan Comments:          Anesthesia Quick Evaluation

## 2024-03-22 NOTE — Interval H&P Note (Signed)
 All questions answered, patient wants to proceed with procedure. ? ?

## 2024-03-22 NOTE — Anesthesia Procedure Notes (Signed)
 Anesthesia Regional Block: Interscalene brachial plexus block   Pre-Anesthetic Checklist: , timeout performed,  Correct Patient, Correct Site, Correct Laterality,  Correct Procedure, Correct Position, site marked,  Risks and benefits discussed,  Pre-op evaluation,  At surgeon's request and post-op pain management  Laterality: Right  Prep: Maximum Sterile Barrier Precautions used, chloraprep       Needles:  Injection technique: Single-shot  Needle Type: Echogenic Stimulator Needle     Needle Length: 9cm  Needle Gauge: 22     Additional Needles:   Procedures:,,,, ultrasound used (permanent image in chart),,    Narrative:  Start time: 03/22/2024 7:45 AM End time: 03/22/2024 7:48 AM Injection made incrementally with aspirations every 5 mL.  Performed by: Personally  Anesthesiologist: Paul Lamarr BRAVO, MD  Additional Notes: Risks, benefits, and alternative discussed. Patient gave consent for procedure. Patient prepped and draped in sterile fashion. Sedation administered, patient remains easily responsive to voice. Relevant anatomy identified with ultrasound guidance. Local anesthetic given in 5cc increments with no signs or symptoms of intravascular injection. No pain or paraesthesias with injection. Patient monitored throughout procedure with signs of LAST or immediate complications. Tolerated well. Ultrasound image placed in chart.  LANEY Paul, MD

## 2024-03-22 NOTE — Progress Notes (Signed)
Assisted Dr. Daiva Huge with right, interscalene , ultrasound guided block. Side rails up, monitors on throughout procedure. See vital signs in flow sheet. Tolerated Procedure well.

## 2024-03-22 NOTE — Anesthesia Procedure Notes (Signed)
 Procedure Name: Intubation Date/Time: 03/22/2024 8:56 AM  Performed by: Leotha Andrez DEL, CRNAPre-anesthesia Checklist: Patient identified, Emergency Drugs available, Suction available, Patient being monitored and Timeout performed Patient Re-evaluated:Patient Re-evaluated prior to induction Oxygen Delivery Method: Circle system utilized Preoxygenation: Pre-oxygenation with 100% oxygen Induction Type: IV induction Ventilation: Mask ventilation without difficulty Laryngoscope Size: Mac and 4 Grade View: Grade II Tube type: Oral Tube size: 7.5 mm Number of attempts: 1 Airway Equipment and Method: Stylet Placement Confirmation: ETT inserted through vocal cords under direct vision, positive ETCO2 and breath sounds checked- equal and bilateral Secured at: 23 cm Tube secured with: Tape Dental Injury: Teeth and Oropharynx as per pre-operative assessment  Comments: Pt has large head and short neck. Pt's glottic opening slightly deviated to the right. Easy mask. Atraumatic intubation.

## 2024-03-22 NOTE — Op Note (Signed)
 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  Patient's nail was quite well-fixed distally and was quite difficult to remove.  We were able to rotationally break it free and then removed the nail without issue.  Proximal bone loss was noted so we used a fracture stem.  Patient be held on therapy.  Humerus moved unit then the case.  Post-operative plan: The patient will be NWB in sling.  The patient will be will be discharged from PACU if continues to be stable as was plan prior to surgery.  DVT prophylaxis Aspirin  81 mg twice daily for 6 weeks.  Pain control with PRN pain medication preferring oral medicines.  Follow up plan will be scheduled in approximately 7 days for incision check and XR.  Physical therapy to start after 4 weeks.  Implants: Tornier perform fracture 12 stem, +3 retentive polyethylene, 39 glenosphere with a 25+3 baseplate and 2 peripheral screws  Post-Op Diagnosis: Same Surgeons:Primary: Cristy Bonner DASEN, MD Assistants:Caroline McBane, PA-C Location: [facility] OR ROOM 6 Anesthesia: General with Exparel  Interscalene Antibiotics: Ancef  2g preop, Vancomycin  1000mg  locally Tourniquet time: None Estimated Blood Loss: 100 Complications: None Specimens: None Implants: Implant Name Type Inv. Item Serial No. Manufacturer Lot No. LRB No. Used Action  BASEPLATE GLENOID RSA 3X25 0D - [id number] Shoulder BASEPLATE GLENOID RSA 3X25 0D [id number] [employer]  Right 1 Implanted  SPHERE GLENOD +3X39XLATERALIZE - [id number] Shoulder SPHERE GLENOD +3X39XLATERALIZE [id number] [employer]  Right 1 Implanted  TORNIER PERFORM   [id number] [employer]  Right 1 Implanted  SCREW BONE THREAD 6.5X35 - [id number] Screw  SCREW BONE THREAD 6.5X35  [employer]  Right 1 Implanted  SCREW PERIPHERAL 5.0X34 - [id number] Screw SCREW PERIPHERAL 5.0X34  [employer]  Right 2 Implanted  CUP HUM REV SHLD 3/4 39 +3 - [id number] Cup CUP HUM REV SHLD 3/4 39 +3 [id number] [employer]  Right 1 Implanted    Indications for Surgery:   Lawrence Henderson is a 68 y.o. male with previous humeral nail with cuff failure.  Benefits and risks of operative and nonoperative management were discussed prior to surgery with patient/guardian(s) and informed consent form was completed.  Infection and need for further surgery were discussed as was prosthetic stability and cuff issues.  We additionally specifically discussed risks of axillary nerve injury, infection, periprosthetic fracture, continued pain and longevity of implants prior to beginning procedure.      Procedure:   The patient was identified in the preoperative holding area where the surgical site was marked. Block placed by anesthesia with exparel .  The patient was taken to the OR where a procedural timeout was called and the above noted anesthesia was induced.  The patient was positioned beachchair on allen table with spider arm positioner.  Preoperative antibiotics were dosed.  The patient's right shoulder was prepped and draped in the usual sterile fashion.  A second preoperative timeout was called.       Began with a deltopectoral approach.  Went through skin sharply to hemostasis progressed.  We identified the deltopectoral interval and retracted the cephalic vein laterally.  We then were able to expose the clavipectoral fascia and placed blunt retractors.  At that point we exposed the subcu scapularis.  We were able to peel it from  the lesser tuberosity and perform a tenodesis of the biceps to the upper border of the pectoralis.  We then externally rotated and released the inferior capsule.  We are able to remove the humeral head with a provisional cut.  We exposed the nail.  Initially  it was difficult to start to expose the nail however once we had placed a osteotome around it we were able to reduce the Nail removal set and eventually remove the nail that was quite difficult.  We needed rotational force to break it free distally and then were able to remove the nail.  The humerus moves unit at the end of that.  We then expose the glenoid.  We prepped the surface after placing a center pin.  We reamed flat and placed a 25+3 baseplate with a 35 center screw.  Once we had placed the screw we placed 2 peripheral screws.  39 baseplate was placed.  We then turned our attention back to the humerus.  There was a lesser tuberosity fracture however the greater tuberosity moves unit.  We passed sutures around the left lesser and greater and sized for a 12 fracture stem.  This obtain good rotational control.  We placed her stem and passed her sutures through the stem.  We irrigated copiously.  We then placed our above-mentioned polyethylene and reduced the joint.  Construct was quite stable.  We repaired the subscapularis and lesser and greater tuberosities with a series of sutures.  Final construct was stable.  Irrigated copiously closed incision multilayer fashion fashion absorbable suture.  Sterile dressing was placed.  Patient awoken taken PACU in stable condition.   Aleck Stalling, PA-C, present and scrubbed throughout the case, critical for completion in a timely fashion, and for retraction, instrumentation, closure.

## 2024-03-22 NOTE — Transfer of Care (Signed)
 Immediate Anesthesia Transfer of Care Note  Patient: Lawrence Henderson  Procedure(s) Performed: ARTHROPLASTY, SHOULDER, TOTAL, REVERSE (Right: Shoulder) REMOVAL, HARDWARE (Right: Shoulder)  Patient Location: PACU  Anesthesia Type:General  Level of Consciousness: drowsy and patient cooperative  Airway & Oxygen Therapy: Patient Spontanous Breathing and Patient connected to face mask oxygen  Post-op Assessment: Report given to RN and Post -op Vital signs reviewed and stable  Post vital signs: Reviewed and stable  Last Vitals:  Vitals Value Taken Time  BP 123/69 03/22/24 11:00  Temp 36.4 C 03/22/24 10:55  Pulse 79 03/22/24 11:02  Resp 18 03/22/24 11:02  SpO2 91 % 03/22/24 11:02  Vitals shown include unfiled device data.  Last Pain:  Vitals:   03/22/24 0638  TempSrc: Temporal  PainSc: 0-No pain      Patients Stated Pain Goal: 3 (03/22/24 9361)  Complications: No notable events documented.

## 2024-03-23 ENCOUNTER — Encounter (HOSPITAL_BASED_OUTPATIENT_CLINIC_OR_DEPARTMENT_OTHER): Payer: Self-pay | Admitting: Orthopaedic Surgery

## 2024-03-23 NOTE — Anesthesia Postprocedure Evaluation (Signed)
 Anesthesia Post Note  Patient: Seve [facility]ette [facility]  Procedure(s) Performed: ARTHROPLASTY, SHOULDER, TOTAL, REVERSE (Right: Shoulder) REMOVAL, HARDWARE (Right: Shoulder)     Patient location during evaluation: PACU Anesthesia Type: General Level of consciousness: awake and alert Pain management: pain level controlled Vital Signs Assessment: post-procedure vital signs reviewed and stable Respiratory status: spontaneous breathing, nonlabored ventilation and respiratory function stable Cardiovascular status: blood pressure returned to baseline Postop Assessment: no apparent nausea or vomiting Anesthetic complications: no   No notable events documented.  Last Vitals:  Vitals:   03/22/24 1132 03/22/24 1148  BP:  117/72  Pulse: 79 78  Resp: 13 16  Temp:  36.5 C  SpO2: 93% 93%    Last Pain:  Vitals:   03/22/24 1148  TempSrc:   PainSc: 0-No pain   Pain Goal: Patients Stated Pain Goal: 3 (03/22/24 9361)                 Vertell Row

## 2024-03-30 DIAGNOSIS — M19011 Primary osteoarthritis, right shoulder: Secondary | ICD-10-CM | POA: Diagnosis not present

## 2024-04-09 ENCOUNTER — Telehealth: Payer: Self-pay | Admitting: Internal Medicine

## 2024-04-09 MED ORDER — AMLODIPINE BESYLATE 10 MG PO TABS: 10.0000 mg | ORAL_TABLET | Freq: Every day | ORAL | 2 refills | Status: AC

## 2024-04-09 NOTE — Telephone Encounter (Signed)
*  STAT* If patient is at the pharmacy, call can be transferred to refill team.   1. Which medications need to be refilled? (please list name of each medication and dose if known) amLODipine  (NORVASC ) 10 MG tablet   TAKE 1 TABLET BY MOUTH EVERY DAY    2. Would you like to learn more about the convenience, safety, & potential cost savings by using the [facility]? No    3. Are you open to using the [facility]  No   4. Which pharmacy/location (including street and city if local pharmacy) is medication to be sent to? [id number] - [place],  - [address] [place] [facility]    5. Do they need a 30 day or 90 day supply? 90 Day Supply

## 2024-04-09 NOTE — Telephone Encounter (Signed)
 RX sent in

## 2024-04-19 DIAGNOSIS — M25511 Pain in right shoulder: Secondary | ICD-10-CM | POA: Diagnosis not present
